# Patient Record
Sex: Female | Born: 1940
Health system: Southern US, Community
[De-identification: ages and names within clinical notes are randomized; demographics above are authoritative.]

## PROBLEM LIST (undated history)

## (undated) DIAGNOSIS — E785 Hyperlipidemia, unspecified: Secondary | ICD-10-CM

## (undated) DIAGNOSIS — Z923 Personal history of irradiation: Secondary | ICD-10-CM

## (undated) DIAGNOSIS — G5600 Carpal tunnel syndrome, unspecified upper limb: Secondary | ICD-10-CM

## (undated) DIAGNOSIS — K219 Gastro-esophageal reflux disease without esophagitis: Secondary | ICD-10-CM

## (undated) DIAGNOSIS — M1712 Unilateral primary osteoarthritis, left knee: Secondary | ICD-10-CM

## (undated) DIAGNOSIS — M1711 Unilateral primary osteoarthritis, right knee: Secondary | ICD-10-CM

## (undated) DIAGNOSIS — I82409 Acute embolism and thrombosis of unspecified deep veins of unspecified lower extremity: Secondary | ICD-10-CM

## (undated) DIAGNOSIS — M1990 Unspecified osteoarthritis, unspecified site: Secondary | ICD-10-CM

## (undated) DIAGNOSIS — C50211 Malignant neoplasm of upper-inner quadrant of right female breast: Secondary | ICD-10-CM

## (undated) HISTORY — DX: Acute embolism and thrombosis of unspecified deep veins of unspecified lower extremity: I82.409

## (undated) HISTORY — DX: Gastro-esophageal reflux disease without esophagitis: K21.9

## (undated) HISTORY — DX: Carpal tunnel syndrome, unspecified upper limb: G56.00

## (undated) HISTORY — PX: SKIN CANCER EXCISION: SHX779

## (undated) HISTORY — PX: TUBAL LIGATION: SHX77

## (undated) HISTORY — DX: Hyperlipidemia, unspecified: E78.5

## (undated) HISTORY — DX: Malignant neoplasm of upper-inner quadrant of right female breast: C50.211

## (undated) HISTORY — PX: DILATION AND CURETTAGE OF UTERUS: SHX78

## (undated) HISTORY — DX: Unspecified osteoarthritis, unspecified site: M19.90

---
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
## 1999-10-23 ENCOUNTER — Encounter: Payer: Self-pay | Admitting: Emergency Medicine

## 1999-10-23 ENCOUNTER — Encounter: Admission: RE | Admit: 1999-10-23 | Discharge: 1999-10-23 | Payer: Self-pay | Admitting: Emergency Medicine

## 2007-11-16 ENCOUNTER — Encounter: Admission: RE | Admit: 2007-11-16 | Discharge: 2007-11-16 | Payer: Self-pay | Admitting: Chiropractic Medicine

## 2008-07-27 DIAGNOSIS — M1990 Unspecified osteoarthritis, unspecified site: Secondary | ICD-10-CM

## 2008-07-27 HISTORY — DX: Unspecified osteoarthritis, unspecified site: M19.90

## 2009-07-27 HISTORY — PX: CHOLECYSTECTOMY: SHX55

## 2010-02-17 ENCOUNTER — Ambulatory Visit (HOSPITAL_COMMUNITY): Admission: RE | Admit: 2010-02-17 | Discharge: 2010-02-17 | Payer: Self-pay | Admitting: General Surgery

## 2010-10-11 LAB — SURGICAL PCR SCREEN
MRSA, PCR: NEGATIVE
Staphylococcus aureus: NEGATIVE

## 2011-07-17 ENCOUNTER — Ambulatory Visit (INDEPENDENT_AMBULATORY_CARE_PROVIDER_SITE_OTHER): Payer: Medicare Other | Admitting: Internal Medicine

## 2011-07-17 ENCOUNTER — Encounter: Payer: Self-pay | Admitting: Internal Medicine

## 2011-07-17 ENCOUNTER — Other Ambulatory Visit: Payer: Self-pay | Admitting: Internal Medicine

## 2011-07-17 ENCOUNTER — Other Ambulatory Visit (INDEPENDENT_AMBULATORY_CARE_PROVIDER_SITE_OTHER): Payer: Medicare Other

## 2011-07-17 DIAGNOSIS — K219 Gastro-esophageal reflux disease without esophagitis: Secondary | ICD-10-CM

## 2011-07-17 DIAGNOSIS — Z0000 Encounter for general adult medical examination without abnormal findings: Secondary | ICD-10-CM | POA: Insufficient documentation

## 2011-07-17 DIAGNOSIS — Z1231 Encounter for screening mammogram for malignant neoplasm of breast: Secondary | ICD-10-CM

## 2011-07-17 DIAGNOSIS — E7800 Pure hypercholesterolemia, unspecified: Secondary | ICD-10-CM

## 2011-07-17 DIAGNOSIS — E785 Hyperlipidemia, unspecified: Secondary | ICD-10-CM | POA: Insufficient documentation

## 2011-07-17 LAB — LDL CHOLESTEROL, DIRECT: Direct LDL: 158.4 mg/dL

## 2011-07-17 LAB — LIPID PANEL
Cholesterol: 248 mg/dL — ABNORMAL HIGH (ref 0–200)
HDL: 68.7 mg/dL (ref >39.00)
VLDL: 14.2 mg/dL (ref 0.0–40.0)

## 2011-07-17 LAB — CBC WITH DIFFERENTIAL/PLATELET
Basophils Relative: 0.8 % (ref 0.0–3.0)
Eosinophils Absolute: 0.2 10*3/uL (ref 0.0–0.7)
Eosinophils Relative: 2.1 % (ref 0.0–5.0)
Lymphocytes Relative: 31.1 % (ref 12.0–46.0)
Neutrophils Relative %: 56.3 % (ref 43.0–77.0)
Platelets: 275 10*3/uL (ref 150.0–400.0)
RBC: 4.34 Mil/uL (ref 3.87–5.11)
WBC: 7.1 10*3/uL (ref 4.5–10.5)

## 2011-07-17 LAB — COMPREHENSIVE METABOLIC PANEL
AST: 41 U/L — ABNORMAL HIGH (ref 0–37)
Albumin: 4.1 g/dL (ref 3.5–5.2)
BUN: 22 mg/dL (ref 6–23)
Calcium: 9.3 mg/dL (ref 8.4–10.5)
Chloride: 107 mEq/L (ref 96–112)
Potassium: 4.8 mEq/L (ref 3.5–5.1)
Sodium: 142 mEq/L (ref 135–145)
Total Protein: 7.4 g/dL (ref 6.0–8.3)

## 2011-07-17 MED ORDER — PANTOPRAZOLE SODIUM 40 MG PO TBEC: 40.0000 mg | DELAYED_RELEASE_TABLET | Freq: Every day | ORAL | Status: DC

## 2011-07-17 NOTE — Patient Instructions (Signed)
Gastroesophageal Reflux Disease, Adult Gastroesophageal reflux disease (GERD) happens when acid from your stomach flows up into the esophagus. When acid comes in contact with the esophagus, the acid causes soreness (inflammation) in the esophagus. Over time, GERD may create small holes (ulcers) in the lining of the esophagus. CAUSES   Increased body weight. This puts pressure on the stomach, making acid rise from the stomach into the esophagus.   Smoking. This increases acid production in the stomach.   Drinking alcohol. This causes decreased pressure in the lower esophageal sphincter (valve or ring of muscle between the esophagus and stomach), allowing acid from the stomach into the esophagus.   Late evening meals and a full stomach. This increases pressure and acid production in the stomach.   A malformed lower esophageal sphincter.  Sometimes, no cause is found. SYMPTOMS   Burning pain in the lower part of the mid-chest behind the breastbone and in the mid-stomach area. This may occur twice a week or more often.   Trouble swallowing.   Sore throat.   Dry cough.   Asthma-like symptoms including chest tightness, shortness of breath, or wheezing.  DIAGNOSIS  Your caregiver may be able to diagnose GERD based on your symptoms. In some cases, X-rays and other tests may be done to check for complications or to check the condition of your stomach and esophagus. TREATMENT  Your caregiver may recommend over-the-counter or prescription medicines to help decrease acid production. Ask your caregiver before starting or adding any new medicines.  HOME CARE INSTRUCTIONS   Change the factors that you can control. Ask your caregiver for guidance concerning weight loss, quitting smoking, and alcohol consumption.   Avoid foods and drinks that make your symptoms worse, such as:   Caffeine or alcoholic drinks.   Chocolate.   Peppermint or mint flavorings.   Garlic and onions.   Spicy foods.     Citrus fruits, such as oranges, lemons, or limes.   Tomato-based foods such as sauce, chili, salsa, and pizza.   Fried and fatty foods.   Avoid lying down for the 3 hours prior to your bedtime or prior to taking a nap.   Eat small, frequent meals instead of large meals.   Wear loose-fitting clothing. Do not wear anything tight around your waist that causes pressure on your stomach.   Raise the head of your bed 6 to 8 inches with wood blocks to help you sleep. Extra pillows will not help.   Only take over-the-counter or prescription medicines for pain, discomfort, or fever as directed by your caregiver.   Do not take aspirin, ibuprofen, or other nonsteroidal anti-inflammatory drugs (NSAIDs).  SEEK IMMEDIATE MEDICAL CARE IF:   You have pain in your arms, neck, jaw, teeth, or back.   Your pain increases or changes in intensity or duration.   You develop nausea, vomiting, or sweating (diaphoresis).   You develop shortness of breath, or you faint.   Your vomit is green, yellow, black, or looks like coffee grounds or blood.   Your stool is red, bloody, or black.  These symptoms could be signs of other problems, such as heart disease, gastric bleeding, or esophageal bleeding. MAKE SURE YOU:   Understand these instructions.   Will watch your condition.   Will get help right away if you are not doing well or get worse.  Document Released: 04/22/2005 Document Revised: 03/25/2011 Document Reviewed: 01/30/2011 [facility] Patient Information 2012 [place].

## 2011-07-17 NOTE — Assessment & Plan Note (Signed)
Start protonix

## 2011-07-17 NOTE — Progress Notes (Signed)
  Subjective:    Patient ID: Stacey Navarro, female    DOB: [birth year], 70 y.o.   MRN: [id number]  Heartburn She complains of heartburn and water brash. She reports no abdominal pain, no belching, no chest pain, no choking, no coughing, no dysphagia, no early satiety, no globus sensation, no hoarse voice, no nausea, no sore throat, no stridor, no tooth decay or no wheezing. This is a chronic problem. The current episode started more than 1 year ago. The problem occurs occasionally. The problem has been unchanged. The heartburn duration is an hour. The heartburn is located in the substernum. The heartburn is of mild intensity. The heartburn wakes her from sleep. The heartburn does not limit her activity. The heartburn doesn't change with position. The symptoms are aggravated by nothing. Pertinent negatives include no anemia, fatigue, melena, muscle weakness, orthopnea or weight loss. Risk factors include no known risk factors. She has tried a PPI Production assistant, radio) for the symptoms. The treatment provided mild relief.      Review of Systems  Constitutional: Negative for fever, chills, weight loss, diaphoresis, activity change, appetite change, fatigue and unexpected weight change.  HENT: Negative.  Negative for sore throat and hoarse voice.   Eyes: Negative.   Respiratory: Negative.  Negative for cough, choking and wheezing.   Cardiovascular: Negative for chest pain, palpitations and leg swelling.  Gastrointestinal: Positive for heartburn. Negative for dysphagia, nausea, vomiting, abdominal pain, diarrhea, constipation, blood in stool, melena, abdominal distention, anal bleeding and rectal pain.  Genitourinary: Negative.   Musculoskeletal: Negative.  Negative for muscle weakness.  Skin: Negative.   Neurological: Negative.   Hematological: Negative for adenopathy. Does not bruise/bleed easily.  Psychiatric/Behavioral: Negative.        Objective:   Physical Exam  Vitals  reviewed. Constitutional: She is oriented to person, place, and time. She appears well-developed and well-nourished. No distress.  HENT:  Head: Normocephalic and atraumatic.  Mouth/Throat: Oropharynx is clear and moist. No oropharyngeal exudate.  Eyes: Conjunctivae are normal. Right eye exhibits no discharge. Left eye exhibits no discharge. No scleral icterus.  Neck: Normal range of motion. Neck supple. No JVD present. No tracheal deviation present. No thyromegaly present.  Cardiovascular: Normal rate, regular rhythm, normal heart sounds and intact distal pulses.  Exam reveals no gallop and no friction rub.   No murmur heard. Pulmonary/Chest: Effort normal and breath sounds normal. No stridor. No respiratory distress. She has no wheezes. She has no rales. She exhibits no tenderness.  Abdominal: Soft. Bowel sounds are normal. She exhibits no distension. There is no tenderness. There is no rebound and no guarding.  Musculoskeletal: Normal range of motion. She exhibits no edema and no tenderness.  Lymphadenopathy:    She has no cervical adenopathy.  Neurological: She is oriented to person, place, and time.  Skin: Skin is warm and dry. No rash noted. She is not diaphoretic. No erythema. No pallor.  Psychiatric: She has a normal mood and affect. Her behavior is normal. Judgment and thought content normal.          Assessment & Plan:

## 2011-07-17 NOTE — Assessment & Plan Note (Signed)
I will check her labs today to see what her lipid levels are

## 2011-09-14 ENCOUNTER — Encounter: Payer: Self-pay | Admitting: Gastroenterology

## 2011-10-12 ENCOUNTER — Encounter: Payer: Medicare Other | Admitting: Gastroenterology

## 2011-11-05 ENCOUNTER — Encounter: Payer: Self-pay | Admitting: Internal Medicine

## 2011-11-05 ENCOUNTER — Ambulatory Visit (INDEPENDENT_AMBULATORY_CARE_PROVIDER_SITE_OTHER): Payer: Medicare Other | Admitting: Internal Medicine

## 2011-11-05 VITALS — BP 132/80 | HR 80 | Temp 98.8°F | Resp 16 | Wt 204.0 lb

## 2011-11-05 DIAGNOSIS — J209 Acute bronchitis, unspecified: Secondary | ICD-10-CM

## 2011-11-05 MED ORDER — HYDROCODONE-HOMATROPINE 5-1.5 MG/5ML PO SYRP: 5.0000 mL | ORAL_SOLUTION | Freq: Three times a day (TID) | ORAL | Status: AC | PRN

## 2011-11-05 MED ORDER — CEFUROXIME AXETIL 500 MG PO TABS: 500.0000 mg | ORAL_TABLET | Freq: Two times a day (BID) | ORAL | Status: AC

## 2011-11-05 NOTE — Patient Instructions (Signed)
Acute Bronchitis You have acute bronchitis. This means you have a chest cold. The airways in your lungs are red and sore (inflamed). Acute means it is sudden onset.  CAUSES Bronchitis is most often caused by the same virus that causes a cold. SYMPTOMS   Body aches.   Chest congestion.   Chills.   Cough.   Fever.   Shortness of breath.   Sore throat.  TREATMENT  Acute bronchitis is usually treated with rest, fluids, and medicines for relief of fever or cough. Most symptoms should go away after a few days or a week. Increased fluids may help thin your secretions and will prevent dehydration. Your caregiver may give you an inhaler to improve your symptoms. The inhaler reduces shortness of breath and helps control cough. You can take over-the-counter pain relievers or cough medicine to decrease coughing, pain, or fever. A cool-air vaporizer may help thin bronchial secretions and make it easier to clear your chest. Antibiotics are usually not needed but can be prescribed if you smoke, are seriously ill, have chronic lung problems, are elderly, or you are at higher risk for developing complications.Allergies and asthma can make bronchitis worse. Repeated episodes of bronchitis may cause longstanding lung problems. Avoid smoking and secondhand smoke.Exposure to cigarette smoke or irritating chemicals will make bronchitis worse. If you are a cigarette smoker, consider using nicotine gum or skin patches to help control withdrawal symptoms. Quitting smoking will help your lungs heal faster. Recovery from bronchitis is often slow, but you should start feeling better after 2 to 3 days. Cough from bronchitis frequently lasts for 3 to 4 weeks. To prevent another bout of acute bronchitis:  Quit smoking.   Wash your hands frequently to get rid of viruses or use a hand sanitizer.   Avoid other people with cold or virus symptoms.   Try not to touch your hands to your mouth, nose, or eyes.  SEEK  IMMEDIATE MEDICAL CARE IF:  You develop increased fever, chills, or chest pain.   You have severe shortness of breath or bloody sputum.   You develop dehydration, fainting, repeated vomiting, or a severe headache.   You have no improvement after 1 week of treatment or you get worse.  MAKE SURE YOU:   Understand these instructions.   Will watch your condition.   Will get help right away if you are not doing well or get worse.  Document Released: 08/20/2004 Document Revised: 07/02/2011 Document Reviewed: 11/05/2010 [facility] Patient Information 2012 [place].

## 2011-11-05 NOTE — Assessment & Plan Note (Signed)
Start ceftin for the infection and a cough suppressant

## 2011-11-05 NOTE — Progress Notes (Signed)
  Subjective:    Patient ID: Stacey Navarro, female    DOB: 26-Aug-1940, 71 y.o.   MRN: [id number]  Cough This is a new problem. The current episode started in the past 7 days. The problem has been gradually worsening. The problem occurs every few hours. The cough is productive of purulent sputum. Associated symptoms include chills and a sore throat. Pertinent negatives include no chest pain, ear congestion, ear pain, fever, headaches, heartburn, hemoptysis, myalgias, nasal congestion, postnasal drip, rash, rhinorrhea, shortness of breath, sweats, weight loss or wheezing. The symptoms are aggravated by nothing. She has tried OTC cough suppressant for the symptoms. The treatment provided mild relief.      Review of Systems  Constitutional: Positive for chills. Negative for fever, weight loss, diaphoresis, activity change, appetite change, fatigue and unexpected weight change.  HENT: Positive for sore throat. Negative for ear pain, rhinorrhea and postnasal drip.   Eyes: Negative.   Respiratory: Positive for cough. Negative for apnea, hemoptysis, choking, chest tightness, shortness of breath, wheezing and stridor.   Cardiovascular: Negative for chest pain, palpitations and leg swelling.  Gastrointestinal: Negative.  Negative for heartburn.  Genitourinary: Negative.   Musculoskeletal: Negative.  Negative for myalgias.  Skin: Negative for rash.  Neurological: Negative.  Negative for headaches.  Hematological: Negative for adenopathy. Does not bruise/bleed easily.  Psychiatric/Behavioral: Negative.        Objective:   Physical Exam  Vitals reviewed. Constitutional: She is oriented to person, place, and time. She appears well-developed and well-nourished. No distress.  HENT:  Head: Normocephalic and atraumatic. No trismus in the jaw.  Right Ear: Hearing, tympanic membrane, external ear and ear canal normal.  Left Ear: Hearing, external ear and ear canal normal.  Nose: Nose normal. No  mucosal edema or rhinorrhea. Right sinus exhibits no maxillary sinus tenderness and no frontal sinus tenderness. Left sinus exhibits no maxillary sinus tenderness and no frontal sinus tenderness.  Mouth/Throat: Oropharynx is clear and moist and mucous membranes are normal. Mucous membranes are not pale, not dry and not cyanotic. No uvula swelling. No oropharyngeal exudate, posterior oropharyngeal edema, posterior oropharyngeal erythema or tonsillar abscesses.  Eyes: Conjunctivae are normal. Right eye exhibits no discharge. Left eye exhibits no discharge. No scleral icterus.  Neck: Normal range of motion. Neck supple. No JVD present. No tracheal deviation present. No thyromegaly present.  Cardiovascular: Normal rate, regular rhythm, normal heart sounds and intact distal pulses.  Exam reveals no gallop and no friction rub.   No murmur heard. Pulmonary/Chest: Effort normal and breath sounds normal. No stridor. No respiratory distress. She has no wheezes. She has no rales. She exhibits no tenderness.  Abdominal: Soft. Bowel sounds are normal. She exhibits no distension. There is no tenderness. There is no rebound and no guarding.  Musculoskeletal: Normal range of motion. She exhibits no edema and no tenderness.  Lymphadenopathy:    She has no cervical adenopathy.  Neurological: She is oriented to person, place, and time.  Skin: Skin is warm and dry. No rash noted. She is not diaphoretic. No erythema. No pallor.  Psychiatric: She has a normal mood and affect. Her behavior is normal. Judgment and thought content normal.          Assessment & Plan:

## 2012-06-06 ENCOUNTER — Ambulatory Visit (INDEPENDENT_AMBULATORY_CARE_PROVIDER_SITE_OTHER): Payer: Medicare Other | Admitting: Internal Medicine

## 2012-06-06 ENCOUNTER — Encounter: Payer: Self-pay | Admitting: Internal Medicine

## 2012-06-06 VITALS — BP 102/70 | HR 62 | Temp 97.3°F | Ht 66.0 in | Wt 202.0 lb

## 2012-06-06 DIAGNOSIS — L0390 Cellulitis, unspecified: Secondary | ICD-10-CM

## 2012-06-06 DIAGNOSIS — L0291 Cutaneous abscess, unspecified: Secondary | ICD-10-CM

## 2012-06-06 MED ORDER — SULFAMETHOXAZOLE-TRIMETHOPRIM 800-160 MG PO TABS: 1.0000 | ORAL_TABLET | Freq: Two times a day (BID) | ORAL | Status: DC

## 2012-06-06 NOTE — Progress Notes (Signed)
  Subjective:    Patient ID: Stacey Navarro, female    DOB: 10/24/1940, 71 y.o.   MRN: [id number]  HPI  Pt presents to the clinic with a 2 week history of a lump on the back of her neck. She is concerned that it may be a cyst. 2 weeks ago she stated that it was a small spot on the back of her neck that was very itchy. The more she scracthed it, the bigger it kept getting. It is not painful. It does not leak pus. She has never had anything like this before. She has taken some advil which she feels has made some of the swelling go down.  Review of Systems      Past Medical History  Diagnosis Date  . GERD (gastroesophageal reflux disease)   . Hyperlipidemia   . Arthritis 2010    OA in knees    Current Outpatient Prescriptions  Medication Sig Dispense Refill  . pantoprazole (PROTONIX) 40 MG tablet Take 1 tablet (40 mg total) by mouth daily.  30 tablet  11  . sulfamethoxazole-trimethoprim (BACTRIM DS,SEPTRA DS) 800-160 MG per tablet Take 1 tablet by mouth 2 (two) times daily.  20 tablet  0    No Known Allergies  Family History  Problem Relation Age of Onset  . Hypertension Mother   . Alcohol abuse Father     History   Social History  . Marital Status: Widowed    Spouse Name: [name]    Number of Children: [name]  . Years of Education: [name]   Occupational History  . Not on file.   Social History Main Topics  . Smoking status: Former Research scientist (life sciences)  . Smokeless tobacco: Never Used  . Alcohol Use: 1.2 oz/week    2 Glasses of wine per week  . Drug Use: No  . Sexually Active: Not Currently    Birth Control/ Protection: Post-menopausal   Other Topics Concern  . Not on file   Social History Narrative  . No narrative on file     Constitutional: Denies fever, malaise, fatigue, headache or abrupt weight changes.  Skin:  Pt reports red lump on the back of her neck. Denies rashes, lesions or ulcercations.     No other specific complaints in a complete review of systems (except as  listed in HPI above).  Objective:   Physical Exam  BP 102/70  Pulse 62  Temp 97.3 F (36.3 C) (Oral)  Ht 5' 6"  (1.676 m)  Wt 202 lb (91.627 kg)  BMI 32.60 kg/m2  SpO2 97% Wt Readings from Last 3 Encounters:  06/06/12 202 lb (91.627 kg)  11/05/11 204 lb (92.534 kg)  07/17/11 219 lb (99.338 kg)    General: Appears their stated age, well developed, well nourished in NAD. Skin: Quarter size abscess at the nape of the neck, coming to a head but no evidence of pus or drainage.  Cardiovascular: Normal rate and rhythm. S1,S2 noted.  No murmur, rubs or gallops noted. No JVD or BLE edema. No carotid bruits noted. Pulmonary/Chest: Normal effort and positive vesicular breath sounds. No respiratory distress. No wheezes, rales or ronchi noted.       Assessment & Plan:   Abscess, new problem with additional workup required  Bactrim x 10 days Avoid scracthing the area. Place warm compresses on the area daily to aid in drainage of the area. If antibiotics not helpful, may need incision and drainage.  RTC if symptoms persist or get worse.

## 2012-06-06 NOTE — Patient Instructions (Addendum)
Abscess An abscess is an infected area that contains a collection of pus and debris. It can occur in almost any part of the body. An abscess is also known as a furuncle or boil. CAUSES   An abscess occurs when tissue gets infected. This can occur from blockage of oil or sweat glands, infection of hair follicles, or a minor injury to the skin. As the body tries to fight the infection, pus collects in the area and creates pressure under the skin. This pressure causes pain. People with weakened immune systems have difficulty fighting infections and get certain abscesses more often.   SYMPTOMS Usually an abscess develops on the skin and becomes a painful mass that is red, warm, and tender. If the abscess forms under the skin, you may feel a moveable soft area under the skin. Some abscesses break open (rupture) on their own, but most will continue to get worse without care. The infection can spread deeper into the body and eventually into the bloodstream, causing you to feel ill.   DIAGNOSIS   Your caregiver will take your medical history and perform a physical exam. A sample of fluid may also be taken from the abscess to determine what is causing your infection. TREATMENT   Your caregiver may prescribe antibiotic medicines to fight the infection. However, taking antibiotics alone usually does not cure an abscess. Your caregiver may need to make a small cut (incision) in the abscess to drain the pus. In some cases, gauze is packed into the abscess to reduce pain and to continue draining the area. HOME CARE INSTRUCTIONS    Only take over-the-counter or prescription medicines for pain, discomfort, or fever as directed by your caregiver.   If you were prescribed antibiotics, take them as directed. Finish them even if you start to feel better.   If gauze is used, follow your caregiver's directions for changing the gauze.   To avoid spreading the infection:   Keep your draining abscess covered with a  bandage.   Wash your hands well.   Do not share personal care items, towels, or whirlpools with others.   Avoid skin contact with others.   Keep your skin and clothes clean around the abscess.   Keep all follow-up appointments as directed by your caregiver.  SEEK MEDICAL CARE IF:    You have increased pain, swelling, redness, fluid drainage, or bleeding.   You have muscle aches, chills, or a general ill feeling.   You have a fever.  MAKE SURE YOU:    Understand these instructions.   Will watch your condition.   Will get help right away if you are not doing well or get worse.  Document Released: 04/22/2005 Document Revised: 01/12/2012 Document Reviewed: 09/25/2011 [facility] Patient Information 2013 [place].

## 2012-07-25 ENCOUNTER — Other Ambulatory Visit: Payer: Self-pay | Admitting: Internal Medicine

## 2012-12-14 ENCOUNTER — Ambulatory Visit (INDEPENDENT_AMBULATORY_CARE_PROVIDER_SITE_OTHER): Payer: Medicare Other | Admitting: Sports Medicine

## 2012-12-14 ENCOUNTER — Encounter: Payer: Self-pay | Admitting: Sports Medicine

## 2012-12-14 VITALS — BP 131/84 | HR 67 | Ht 66.0 in | Wt 193.0 lb

## 2012-12-14 DIAGNOSIS — M1711 Unilateral primary osteoarthritis, right knee: Secondary | ICD-10-CM | POA: Insufficient documentation

## 2012-12-14 DIAGNOSIS — IMO0002 Reserved for concepts with insufficient information to code with codable children: Secondary | ICD-10-CM

## 2012-12-14 DIAGNOSIS — M1710 Unilateral primary osteoarthritis, unspecified knee: Secondary | ICD-10-CM

## 2012-12-14 MED ORDER — METHYLPREDNISOLONE ACETATE 40 MG/ML IJ SUSP
40.0000 mg | Freq: Once | INTRAMUSCULAR | Status: AC
  Administered 2012-12-14: 40 mg via INTRA_ARTICULAR

## 2012-12-14 NOTE — Patient Instructions (Signed)
Very nice to meet you. We did give you an injection into your right knee today. This may be sore for a couple days. Ice may be beneficial. I am also giving you exercises I would like you to do 3 times a week. Lets make a followup appointment in 4 weeks.

## 2012-12-14 NOTE — Assessment & Plan Note (Addendum)
Patient given her steroid injection into the right knee today. Patient given home exercise program that she will do 3 times a week. Encourage icing 20 minutes 3 times a day as well.  Knee compression sleeve given as well.  We will get this patient's notes from [facility] and located patient's x-rays at that time. Patient will return again in 4 weeks. If she continues to have pain at that time we may want to consider repeat x-rays as well as viscus supplementation.

## 2012-12-14 NOTE — Addendum Note (Signed)
Addended by: Arnette Schaumann C on: 12/14/2012 11:35 AM   Modules accepted: Orders

## 2012-12-14 NOTE — Progress Notes (Signed)
SUBJECTIVE: Stacey Navarro is a 72 y.o. female who has right knee pain for years. No true mechanism of injury. Patient states that the pain is mostly on the medial aspect of the knee and describes it is more of a sharp pain but does have a dull aching sensation sometimes. Patient has not taken any medications for it but has been continue to do exercises at her gym. Patient states that she's not doing the exercises as much and that seems to have exacerbated the knee pain. Patient notices that the pain is worse with walking a significant amount of time especially with stairs. Patient denies any clicking popping or any other mechanical symptoms at this time. Denies any radiation of pain or any numbness in the feet extremities. Patient also denies any nighttime awakening.    OBJECTIVE: Blood pressure 131/84, pulse 67, height 5' 6"  (1.676 m), weight 193 lb (87.544 kg).  Appearance: alert, well appearing, and in no distress, oriented to person, place, and time and normal appearing weight. Knee exam: soft tissue tenderness over medial joint line, negative drawer sign, positive drawer sign, negative pivot-shift, collateral ligaments intact, negative McMurray sign, normal ipsilateral hip exam. X-ray: not available.  Procedure note After verbal and written consent given pt was prepped with betadine.  1:3 kenalog 40 to lidocaine used in right knee with 22g 1 1/2 inch needle.  Pt minimal bleeding dressed with band aid, Pt given red flags to look for pt had better pain control immediatly.     ASSESSMENT: Knee underlying chronic DJD likely  PLAN: rest the injured area as much as practical, apply ice packs, elevate the injured limb, compressive bandage, injection today as above.  See orders for this visit as documented in the electronic medical record.

## 2013-08-03 ENCOUNTER — Other Ambulatory Visit: Payer: Self-pay | Admitting: Internal Medicine

## 2013-08-21 ENCOUNTER — Other Ambulatory Visit: Payer: Self-pay | Admitting: Internal Medicine

## 2013-08-24 ENCOUNTER — Other Ambulatory Visit: Payer: Self-pay | Admitting: Internal Medicine

## 2013-08-25 ENCOUNTER — Other Ambulatory Visit: Payer: Self-pay | Admitting: Internal Medicine

## 2013-08-25 ENCOUNTER — Telehealth: Payer: Self-pay

## 2013-08-25 MED ORDER — PANTOPRAZOLE SODIUM 40 MG PO TBEC: DELAYED_RELEASE_TABLET | ORAL | Status: DC

## 2013-08-25 NOTE — Telephone Encounter (Signed)
The patient called and is hoping to get a refill on her protonix rx

## 2014-05-21 ENCOUNTER — Encounter: Payer: Self-pay | Admitting: Sports Medicine

## 2014-05-21 ENCOUNTER — Ambulatory Visit (INDEPENDENT_AMBULATORY_CARE_PROVIDER_SITE_OTHER): Payer: Medicare Other | Admitting: Sports Medicine

## 2014-05-21 ENCOUNTER — Ambulatory Visit
Admission: RE | Admit: 2014-05-21 | Discharge: 2014-05-21 | Disposition: A | Discharge status: Home / Self Care | Payer: Medicare Other | Source: Ambulatory Visit | Attending: Family Medicine | Admitting: Family Medicine

## 2014-05-21 VITALS — BP 144/86 | HR 73 | Ht 66.0 in | Wt 195.0 lb

## 2014-05-21 DIAGNOSIS — M79645 Pain in left finger(s): Secondary | ICD-10-CM

## 2014-05-21 DIAGNOSIS — M79644 Pain in right finger(s): Secondary | ICD-10-CM

## 2014-05-21 NOTE — Assessment & Plan Note (Addendum)
Most likely a trigger thumb with the report of it "locking" in place and palpation of nodule on exam. No symptoms to suggest carpal tunnel. Could be associated with arthritis. No injury to suggest a fracture. Exam not suggestive of de Quervain's tenosynovitis.  - will try voltaren gel applied to area of most tender TID  - f/u in 2 weeks  - if not improved, then consider injection

## 2014-05-21 NOTE — Progress Notes (Signed)
  Stacey Navarro - 73 y.o. female MRN [id number]  Date of birth: [birth year]    SUBJECTIVE:     Stacey Navarro is a 73 year old F presenting with b/l thumb pain. The pain has been occuring for three weeks. Both her thumbs will lock in the extended position at times. This will last for 3-4 minutes. She is now retired and use to work as an account. She hasn't been doing any new exercises or working more around the house. She denies any thyroid or having diabetes. She denies any numbness, tingling or weakness. Nothing has seemed to improve her symptoms. It has been harder for her to push down on the remote with her right thumb. She has been using some topical Voltaren gel but only intermittently.  ROS:     See HPI   OBJECTIVE: BP 144/86  Pulse 73  Physical Exam:  Vital signs are reviewed. General: Well appearing, NAD, alert.  Thumb/hand Exam:  Laterality: right Appearance: no swelling, erythema, ecchymosis  Edema: no   Tenderness: yes  Ulnar aspect of base of MCP Palpation: small nodule on A1 pulley Range of Motion:  Wrist Flexion: normal Wrist Extension:normal Wrist Ulnar deviation: normal Wrist Radial deviation: normal  Thumb flexion: limited   Thumb extension: normal   Thumb opposition: normal  Maneuvers:  Finkelstein's: neg Tinel's: neg Phalen's: neg  Strength:  Wrist extension: 5/5 Wrist flexion: 5/5 Ulnar Deviation: 5/5  Radial Deviation: 5/5   Thumb/hand Exam:  Laterality: left Appearance: no swelling, erythema, ecchymosis  Edema: no  Tenderness: yes Ulnar aspect of base of MCP Palpation: small nodule on A1 pulley Range of Motion:  Wrist Flexion: normal Wrist Extension:normal Wrist Ulnar deviation: normal Wrist Radial deviation: normal  Thumb flexion:limited  Thumb extension:normal  Thumb opposition: normal Maneuvers:  Finkelstein's: neg Tinel's: neg Phalen's: neg Strength:  Wrist extension: 5/5 Wrist flexion: 5/5 Ulnar Deviation: 5/5 Radial Deviation: 5/5    X-rays of each thumb including AP, lateral, and CMC views are reviewed. There are some mild degenerative changes at the [employer] bilaterally. Nothing acute.  ASSESSMENT & PLAN:  See problem based charting & AVS for pt instructions.

## 2014-06-05 ENCOUNTER — Encounter: Payer: Self-pay | Admitting: Sports Medicine

## 2014-06-05 ENCOUNTER — Ambulatory Visit (INDEPENDENT_AMBULATORY_CARE_PROVIDER_SITE_OTHER): Payer: Medicare Other | Admitting: Sports Medicine

## 2014-06-05 VITALS — BP 121/77 | HR 67 | Ht 66.0 in | Wt 195.0 lb

## 2014-06-05 DIAGNOSIS — M65311 Trigger thumb, right thumb: Secondary | ICD-10-CM

## 2014-06-05 DIAGNOSIS — M79644 Pain in right finger(s): Secondary | ICD-10-CM

## 2014-06-05 DIAGNOSIS — M65312 Trigger thumb, left thumb: Secondary | ICD-10-CM

## 2014-06-05 MED ORDER — TRIAMCINOLONE ACETONIDE 10 MG/ML IJ SUSP
2.0000 mg | Freq: Once | INTRAMUSCULAR | Status: AC
  Administered 2014-06-05: 2 mg via INTRA_ARTICULAR

## 2014-06-05 NOTE — Progress Notes (Signed)
   Subjective:    Patient ID: Stacey Navarro, female    DOB: 07/16/1941, 73 y.o.   MRN: [id number]  HPI  Patient comes in today for follow-up on bilateral trigger thumbs. She has been using topical Voltaren. Left thumb is improving but the right thumb is not. She is here today to consider cortisone injection.    Review of Systems     Objective:   Physical Exam Well-developed, well-nourished. No acute distress  Left thumb: There is still a painful nodule at the A1 pulley. However, range of motion is good. No active triggering  Right thumb: Patient is still unable to completely flex the IP and MCP joints. Flexor tendon is intact. Palpable nodule at the A1 pulley. No soft tissue swelling.       Assessment & Plan:  Bilateral trigger thumbs  Since the left thumb is improving I will hold on any further treatment here. For the right thumb I recommended a cortisone injection. This was done under ultrasound guidance. Patient will follow-up with me in 2-3 weeks. If symptoms resolve with today's cortisone injection then we may consider injecting her left thumb follow-up if symptoms have not completely resolved by then. If her symptoms persist despite today's injection I would consider referral to one of the hand specialist to consider A1 pulley release.  Consent obtained and verified. Time-out conducted. Noted no overlying erythema, induration, or other signs of local infection. Skin prepped in a sterile fashion. Topical analgesic spray: Ethyl chloride. Joint: right trigger thumb Needle: 25g 5/8 inch Completed without difficulty. Meds: 0.2 cc triamcinolone and 0.2cc 1% xylocaine. Injected into flexor tendon sheath just distal to the A1 pulley  Advised to call if fevers/chills, erythema, induration, drainage, or persistent bleeding.

## 2014-06-19 ENCOUNTER — Ambulatory Visit: Payer: Medicare Other | Admitting: Sports Medicine

## 2014-07-03 ENCOUNTER — Ambulatory Visit: Payer: Medicare Other | Admitting: Sports Medicine

## 2014-07-27 DIAGNOSIS — C50919 Malignant neoplasm of unspecified site of unspecified female breast: Secondary | ICD-10-CM

## 2014-07-27 HISTORY — DX: Malignant neoplasm of unspecified site of unspecified female breast: C50.919

## 2014-09-18 ENCOUNTER — Other Ambulatory Visit: Payer: Self-pay | Admitting: Internal Medicine

## 2014-10-03 ENCOUNTER — Encounter: Payer: Self-pay | Admitting: Internal Medicine

## 2014-10-03 ENCOUNTER — Ambulatory Visit (INDEPENDENT_AMBULATORY_CARE_PROVIDER_SITE_OTHER): Payer: Medicare Other | Admitting: Internal Medicine

## 2014-10-03 ENCOUNTER — Other Ambulatory Visit (INDEPENDENT_AMBULATORY_CARE_PROVIDER_SITE_OTHER): Payer: Medicare Other

## 2014-10-03 ENCOUNTER — Other Ambulatory Visit (HOSPITAL_COMMUNITY)
Admission: RE | Admit: 2014-10-03 | Discharge: 2014-10-03 | Disposition: A | Discharge status: Home / Self Care | Payer: Medicare Other | Source: Ambulatory Visit | Attending: Internal Medicine | Admitting: Internal Medicine

## 2014-10-03 VITALS — BP 128/82 | HR 98 | Temp 98.5°F | Resp 16 | Wt 219.0 lb

## 2014-10-03 DIAGNOSIS — R7989 Other specified abnormal findings of blood chemistry: Secondary | ICD-10-CM

## 2014-10-03 DIAGNOSIS — Z23 Encounter for immunization: Secondary | ICD-10-CM | POA: Diagnosis not present

## 2014-10-03 DIAGNOSIS — Z124 Encounter for screening for malignant neoplasm of cervix: Secondary | ICD-10-CM | POA: Insufficient documentation

## 2014-10-03 DIAGNOSIS — E7800 Pure hypercholesterolemia, unspecified: Secondary | ICD-10-CM

## 2014-10-03 DIAGNOSIS — K219 Gastro-esophageal reflux disease without esophagitis: Secondary | ICD-10-CM

## 2014-10-03 DIAGNOSIS — R945 Abnormal results of liver function studies: Secondary | ICD-10-CM

## 2014-10-03 DIAGNOSIS — Z0000 Encounter for general adult medical examination without abnormal findings: Secondary | ICD-10-CM

## 2014-10-03 DIAGNOSIS — Z1231 Encounter for screening mammogram for malignant neoplasm of breast: Secondary | ICD-10-CM

## 2014-10-03 LAB — COMPREHENSIVE METABOLIC PANEL
ALBUMIN: 4.4 g/dL (ref 3.5–5.2)
ALT: 48 U/L — ABNORMAL HIGH (ref 0–35)
AST: 33 U/L (ref 0–37)
Alkaline Phosphatase: 108 U/L (ref 39–117)
BUN: 17 mg/dL (ref 6–23)
CALCIUM: 9.9 mg/dL (ref 8.4–10.5)
CHLORIDE: 103 meq/L (ref 96–112)
CO2: 27 meq/L (ref 19–32)
Creatinine, Ser: 0.92 mg/dL (ref 0.40–1.20)
GFR: 63.78 mL/min (ref >60.00)
Glucose, Bld: 98 mg/dL (ref 70–99)
POTASSIUM: 4.1 meq/L (ref 3.5–5.1)
Sodium: 138 mEq/L (ref 135–145)
TOTAL PROTEIN: 8 g/dL (ref 6.0–8.3)
Total Bilirubin: 0.7 mg/dL (ref 0.2–1.2)

## 2014-10-03 LAB — CBC WITH DIFFERENTIAL/PLATELET
Basophils Absolute: 0.1 10*3/uL (ref 0.0–0.1)
Basophils Relative: 0.9 % (ref 0.0–3.0)
EOS ABS: 0.2 10*3/uL (ref 0.0–0.7)
Eosinophils Relative: 2 % (ref 0.0–5.0)
HCT: 44.4 % (ref 36.0–46.0)
Hemoglobin: 15.2 g/dL — ABNORMAL HIGH (ref 12.0–15.0)
Lymphocytes Relative: 20.6 % (ref 12.0–46.0)
Lymphs Abs: 2 10*3/uL (ref 0.7–4.0)
MCHC: 34.3 g/dL (ref 30.0–36.0)
MCV: 93.2 fl (ref 78.0–100.0)
MONOS PCT: 6.5 % (ref 3.0–12.0)
Monocytes Absolute: 0.6 10*3/uL (ref 0.1–1.0)
Neutro Abs: 6.9 10*3/uL (ref 1.4–7.7)
Neutrophils Relative %: 70 % (ref 43.0–77.0)
Platelets: 297 10*3/uL (ref 150.0–400.0)
RBC: 4.76 Mil/uL (ref 3.87–5.11)
RDW: 14.1 % (ref 11.5–15.5)
WBC: 9.9 10*3/uL (ref 4.0–10.5)

## 2014-10-03 LAB — LIPID PANEL
CHOL/HDL RATIO: 4
Cholesterol: 230 mg/dL — ABNORMAL HIGH (ref 0–200)
HDL: 65.5 mg/dL (ref >39.00)
LDL Cholesterol: 146 mg/dL — ABNORMAL HIGH (ref 0–99)
NonHDL: 164.5
Triglycerides: 94 mg/dL (ref 0.0–149.0)
VLDL: 18.8 mg/dL (ref 0.0–40.0)

## 2014-10-03 LAB — TSH: TSH: 3.21 u[IU]/mL (ref 0.35–4.50)

## 2014-10-03 LAB — FECAL OCCULT BLOOD, GUAIAC: FECAL OCCULT BLD: NEGATIVE

## 2014-10-03 MED ORDER — PANTOPRAZOLE SODIUM 40 MG PO TBEC: DELAYED_RELEASE_TABLET | ORAL | Status: DC

## 2014-10-03 NOTE — Patient Instructions (Signed)
Preventive Care for Adults A healthy lifestyle and preventive care can promote health and wellness. Preventive health guidelines for women include the following key practices.  A routine yearly physical is a good way to check with your health care provider about your health and preventive screening. It is a chance to share any concerns and updates on your health and to receive a thorough exam.  Visit your dentist for a routine exam and preventive care every 6 months. Brush your teeth twice a day and floss once a day. Good oral hygiene prevents tooth decay and gum disease.  The frequency of eye exams is based on your age, health, family medical history, use of contact lenses, and other factors. Follow your health care provider's recommendations for frequency of eye exams.  Eat a healthy diet. Foods like vegetables, fruits, whole grains, low-fat dairy products, and lean protein foods contain the nutrients you need without too many calories. Decrease your intake of foods high in solid fats, added sugars, and salt. Eat the right amount of calories for you.Get information about a proper diet from your health care provider, if necessary.  Regular physical exercise is one of the most important things you can do for your health. Most adults should get at least 150 minutes of moderate-intensity exercise (any activity that increases your heart rate and causes you to sweat) each week. In addition, most adults need muscle-strengthening exercises on 2 or more days a week.  Maintain a healthy weight. The body mass index (BMI) is a screening tool to identify possible weight problems. It provides an estimate of body fat based on height and weight. Your health care provider can find your BMI and can help you achieve or maintain a healthy weight.For adults 20 years and older:  A BMI below 18.5 is considered underweight.  A BMI of 18.5 to 24.9 is normal.  A BMI of 25 to 29.9 is considered overweight.  A BMI of  30 and above is considered obese.  Maintain normal blood lipids and cholesterol levels by exercising and minimizing your intake of saturated fat. Eat a balanced diet with plenty of fruit and vegetables. Blood tests for lipids and cholesterol should begin at age 76 and be repeated every 5 years. If your lipid or cholesterol levels are high, you are over 50, or you are at high risk for heart disease, you may need your cholesterol levels checked more frequently.Ongoing high lipid and cholesterol levels should be treated with medicines if diet and exercise are not working.  If you smoke, find out from your health care provider how to quit. If you do not use tobacco, do not start.  Lung cancer screening is recommended for adults aged 22-80 years who are at high risk for developing lung cancer because of a history of smoking. A yearly low-dose CT scan of the lungs is recommended for people who have at least a 30-pack-year history of smoking and are a current smoker or have quit within the past 15 years. A pack year of smoking is smoking an average of 1 pack of cigarettes a day for 1 year (for example: 1 pack a day for 30 years or 2 packs a day for 15 years). Yearly screening should continue until the smoker has stopped smoking for at least 15 years. Yearly screening should be stopped for people who develop a health problem that would prevent them from having lung cancer treatment.  If you are pregnant, do not drink alcohol. If you are breastfeeding,  be very cautious about drinking alcohol. If you are not pregnant and choose to drink alcohol, do not have more than 1 drink per day. One drink is considered to be 12 ounces (355 mL) of beer, 5 ounces (148 mL) of wine, or 1.5 ounces (44 mL) of liquor.  Avoid use of street drugs. Do not share needles with anyone. Ask for help if you need support or instructions about stopping the use of drugs.  High blood pressure causes heart disease and increases the risk of  stroke. Your blood pressure should be checked at least every 1 to 2 years. Ongoing high blood pressure should be treated with medicines if weight loss and exercise do not work.  If you are 75-52 years old, ask your health care provider if you should take aspirin to prevent strokes.  Diabetes screening involves taking a blood sample to check your fasting blood sugar level. This should be done once every 3 years, after age 15, if you are within normal weight and without risk factors for diabetes. Testing should be considered at a younger age or be carried out more frequently if you are overweight and have at least 1 risk factor for diabetes.  Breast cancer screening is essential preventive care for women. You should practice "breast self-awareness." This means understanding the normal appearance and feel of your breasts and may include breast self-examination. Any changes detected, no matter how small, should be reported to a health care provider. Women in their 58s and 30s should have a clinical breast exam (CBE) by a health care provider as part of a regular health exam every 1 to 3 years. After age 16, women should have a CBE every year. Starting at age 53, women should consider having a mammogram (breast X-ray test) every year. Women who have a family history of breast cancer should talk to their health care provider about genetic screening. Women at a high risk of breast cancer should talk to their health care providers about having an MRI and a mammogram every year.  Breast cancer gene (BRCA)-related cancer risk assessment is recommended for women who have family members with BRCA-related cancers. BRCA-related cancers include breast, ovarian, tubal, and peritoneal cancers. Having family members with these cancers may be associated with an increased risk for harmful changes (mutations) in the breast cancer genes BRCA1 and BRCA2. Results of the assessment will determine the need for genetic counseling and  BRCA1 and BRCA2 testing.  Routine pelvic exams to screen for cancer are no longer recommended for nonpregnant women who are considered low risk for cancer of the pelvic organs (ovaries, uterus, and vagina) and who do not have symptoms. Ask your health care provider if a screening pelvic exam is right for you.  If you have had past treatment for cervical cancer or a condition that could lead to cancer, you need Pap tests and screening for cancer for at least 20 years after your treatment. If Pap tests have been discontinued, your risk factors (such as having a new sexual partner) need to be reassessed to determine if screening should be resumed. Some women have medical problems that increase the chance of getting cervical cancer. In these cases, your health care provider may recommend more frequent screening and Pap tests.  The HPV test is an additional test that may be used for cervical cancer screening. The HPV test looks for the virus that can cause the cell changes on the cervix. The cells collected during the Pap test can be  tested for HPV. The HPV test could be used to screen women aged 30 years and older, and should be used in women of any age who have unclear Pap test results. After the age of 30, women should have HPV testing at the same frequency as a Pap test.  Colorectal cancer can be detected and often prevented. Most routine colorectal cancer screening begins at the age of 50 years and continues through age 75 years. However, your health care provider may recommend screening at an earlier age if you have risk factors for colon cancer. On a yearly basis, your health care provider may provide home test kits to check for hidden blood in the stool. Use of a small camera at the end of a tube, to directly examine the colon (sigmoidoscopy or colonoscopy), can detect the earliest forms of colorectal cancer. Talk to your health care provider about this at age 50, when routine screening begins. Direct  exam of the colon should be repeated every 5-10 years through age 75 years, unless early forms of pre-cancerous polyps or small growths are found.  People who are at an increased risk for hepatitis B should be screened for this virus. You are considered at high risk for hepatitis B if:  You were born in a country where hepatitis B occurs often. Talk with your health care provider about which countries are considered high risk.  Your parents were born in a high-risk country and you have not received a shot to protect against hepatitis B (hepatitis B vaccine).  You have HIV or AIDS.  You use needles to inject street drugs.  You live with, or have sex with, someone who has hepatitis B.  You get hemodialysis treatment.  You take certain medicines for conditions like cancer, organ transplantation, and autoimmune conditions.  Hepatitis C blood testing is recommended for all people born from 1945 through 1965 and any individual with known risks for hepatitis C.  Practice safe sex. Use condoms and avoid high-risk sexual practices to reduce the spread of sexually transmitted infections (STIs). STIs include gonorrhea, chlamydia, syphilis, trichomonas, herpes, HPV, and human immunodeficiency virus (HIV). Herpes, HIV, and HPV are viral illnesses that have no cure. They can result in disability, cancer, and death.  You should be screened for sexually transmitted illnesses (STIs) including gonorrhea and chlamydia if:  You are sexually active and are younger than 24 years.  You are older than 24 years and your health care provider tells you that you are at risk for this type of infection.  Your sexual activity has changed since you were last screened and you are at an increased risk for chlamydia or gonorrhea. Ask your health care provider if you are at risk.  If you are at risk of being infected with HIV, it is recommended that you take a prescription medicine daily to prevent HIV infection. This is  called preexposure prophylaxis (PrEP). You are considered at risk if:  You are a heterosexual woman, are sexually active, and are at increased risk for HIV infection.  You take drugs by injection.  You are sexually active with a partner who has HIV.  Talk with your health care provider about whether you are at high risk of being infected with HIV. If you choose to begin PrEP, you should first be tested for HIV. You should then be tested every 3 months for as long as you are taking PrEP.  Osteoporosis is a disease in which the bones lose minerals and strength   with aging. This can result in serious bone fractures or breaks. The risk of osteoporosis can be identified using a bone density scan. Women ages 65 years and over and women at risk for fractures or osteoporosis should discuss screening with their health care providers. Ask your health care provider whether you should take a calcium supplement or vitamin D to reduce the rate of osteoporosis.  Menopause can be associated with physical symptoms and risks. Hormone replacement therapy is available to decrease symptoms and risks. You should talk to your health care provider about whether hormone replacement therapy is right for you.  Use sunscreen. Apply sunscreen liberally and repeatedly throughout the day. You should seek shade when your shadow is shorter than you. Protect yourself by wearing long sleeves, pants, a wide-brimmed hat, and sunglasses year round, whenever you are outdoors.  Once a month, do a whole body skin exam, using a mirror to look at the skin on your back. Tell your health care provider of new moles, moles that have irregular borders, moles that are larger than a pencil eraser, or moles that have changed in shape or color.  Stay current with required vaccines (immunizations).  Influenza vaccine. All adults should be immunized every year.  Tetanus, diphtheria, and acellular pertussis (Td, Tdap) vaccine. Pregnant women should  receive 1 dose of Tdap vaccine during each pregnancy. The dose should be obtained regardless of the length of time since the last dose. Immunization is preferred during the 27th-36th week of gestation. An adult who has not previously received Tdap or who does not know her vaccine status should receive 1 dose of Tdap. This initial dose should be followed by tetanus and diphtheria toxoids (Td) booster doses every 10 years. Adults with an unknown or incomplete history of completing a 3-dose immunization series with Td-containing vaccines should begin or complete a primary immunization series including a Tdap dose. Adults should receive a Td booster every 10 years.  Varicella vaccine. An adult without evidence of immunity to varicella should receive 2 doses or a second dose if she has previously received 1 dose. Pregnant females who do not have evidence of immunity should receive the first dose after pregnancy. This first dose should be obtained before leaving the health care facility. The second dose should be obtained 4-8 weeks after the first dose.  Human papillomavirus (HPV) vaccine. Females aged 13-26 years who have not received the vaccine previously should obtain the 3-dose series. The vaccine is not recommended for use in pregnant females. However, pregnancy testing is not needed before receiving a dose. If a female is found to be pregnant after receiving a dose, no treatment is needed. In that case, the remaining doses should be delayed until after the pregnancy. Immunization is recommended for any person with an immunocompromised condition through the age of 26 years if she did not get any or all doses earlier. During the 3-dose series, the second dose should be obtained 4-8 weeks after the first dose. The third dose should be obtained 24 weeks after the first dose and 16 weeks after the second dose.  Zoster vaccine. One dose is recommended for adults aged 60 years or older unless certain conditions are  present.  Measles, mumps, and rubella (MMR) vaccine. Adults born before 1957 generally are considered immune to measles and mumps. Adults born in 1957 or later should have 1 or more doses of MMR vaccine unless there is a contraindication to the vaccine or there is laboratory evidence of immunity to   each of the three diseases. A routine second dose of MMR vaccine should be obtained at least 28 days after the first dose for students attending postsecondary schools, health care workers, or international travelers. People who received inactivated measles vaccine or an unknown type of measles vaccine during 1963-1967 should receive 2 doses of MMR vaccine. People who received inactivated mumps vaccine or an unknown type of mumps vaccine before 1979 and are at high risk for mumps infection should consider immunization with 2 doses of MMR vaccine. For females of childbearing age, rubella immunity should be determined. If there is no evidence of immunity, females who are not pregnant should be vaccinated. If there is no evidence of immunity, females who are pregnant should delay immunization until after pregnancy. Unvaccinated health care workers born before 1957 who lack laboratory evidence of measles, mumps, or rubella immunity or laboratory confirmation of disease should consider measles and mumps immunization with 2 doses of MMR vaccine or rubella immunization with 1 dose of MMR vaccine.  Pneumococcal 13-valent conjugate (PCV13) vaccine. When indicated, a person who is uncertain of her immunization history and has no record of immunization should receive the PCV13 vaccine. An adult aged 19 years or older who has certain medical conditions and has not been previously immunized should receive 1 dose of PCV13 vaccine. This PCV13 should be followed with a dose of pneumococcal polysaccharide (PPSV23) vaccine. The PPSV23 vaccine dose should be obtained at least 8 weeks after the dose of PCV13 vaccine. An adult aged 19  years or older who has certain medical conditions and previously received 1 or more doses of PPSV23 vaccine should receive 1 dose of PCV13. The PCV13 vaccine dose should be obtained 1 or more years after the last PPSV23 vaccine dose.  Pneumococcal polysaccharide (PPSV23) vaccine. When PCV13 is also indicated, PCV13 should be obtained first. All adults aged 65 years and older should be immunized. An adult younger than age 65 years who has certain medical conditions should be immunized. Any person who resides in a nursing home or long-term care facility should be immunized. An adult smoker should be immunized. People with an immunocompromised condition and certain other conditions should receive both PCV13 and PPSV23 vaccines. People with human immunodeficiency virus (HIV) infection should be immunized as soon as possible after diagnosis. Immunization during chemotherapy or radiation therapy should be avoided. Routine use of PPSV23 vaccine is not recommended for American Indians, [place] Natives, or people younger than 65 years unless there are medical conditions that require PPSV23 vaccine. When indicated, people who have unknown immunization and have no record of immunization should receive PPSV23 vaccine. One-time revaccination 5 years after the first dose of PPSV23 is recommended for people aged 19-64 years who have chronic kidney failure, nephrotic syndrome, asplenia, or immunocompromised conditions. People who received 1-2 doses of PPSV23 before age 65 years should receive another dose of PPSV23 vaccine at age 65 years or later if at least 5 years have passed since the previous dose. Doses of PPSV23 are not needed for people immunized with PPSV23 at or after age 65 years.  Meningococcal vaccine. Adults with asplenia or persistent complement component deficiencies should receive 2 doses of quadrivalent meningococcal conjugate (MenACWY-D) vaccine. The doses should be obtained at least 2 months apart.  Microbiologists working with certain meningococcal bacteria, military recruits, people at risk during an outbreak, and people who travel to or live in countries with a high rate of meningitis should be immunized. A first-year college student up through age   21 years who is living in a residence hall should receive a dose if she did not receive a dose on or after her 16th birthday. Adults who have certain high-risk conditions should receive one or more doses of vaccine.  Hepatitis A vaccine. Adults who wish to be protected from this disease, have certain high-risk conditions, work with hepatitis A-infected animals, work in hepatitis A research labs, or travel to or work in countries with a high rate of hepatitis A should be immunized. Adults who were previously unvaccinated and who anticipate close contact with an international adoptee during the first 60 days after arrival in the [place] from a country with a high rate of hepatitis A should be immunized.  Hepatitis B vaccine. Adults who wish to be protected from this disease, have certain high-risk conditions, may be exposed to blood or other infectious body fluids, are household contacts or sex partners of hepatitis B positive people, are clients or workers in certain care facilities, or travel to or work in countries with a high rate of hepatitis B should be immunized.  Haemophilus influenzae type b (Hib) vaccine. A previously unvaccinated person with asplenia or sickle cell disease or having a scheduled splenectomy should receive 1 dose of Hib vaccine. Regardless of previous immunization, a recipient of a hematopoietic stem cell transplant should receive a 3-dose series 6-12 months after her successful transplant. Hib vaccine is not recommended for adults with HIV infection. Preventive Services / Frequency Ages 64 to 68 years  Blood pressure check.** / Every 1 to 2 years.  Lipid and cholesterol check.** / Every 5 years beginning at age  22.  Clinical breast exam.** / Every 3 years for women in their 88s and 53s.  BRCA-related cancer risk assessment.** / For women who have family members with a BRCA-related cancer (breast, ovarian, tubal, or peritoneal cancers).  Pap test.** / Every 2 years from ages 90 through 51. Every 3 years starting at age 21 through age 56 or 3 with a history of 3 consecutive normal Pap tests.  HPV screening.** / Every 3 years from ages 24 through ages 1 to 46 with a history of 3 consecutive normal Pap tests.  Hepatitis C blood test.** / For any individual with known risks for hepatitis C.  Skin self-exam. / Monthly.  Influenza vaccine. / Every year.  Tetanus, diphtheria, and acellular pertussis (Tdap, Td) vaccine.** / Consult your health care provider. Pregnant women should receive 1 dose of Tdap vaccine during each pregnancy. 1 dose of Td every 10 years.  Varicella vaccine.** / Consult your health care provider. Pregnant females who do not have evidence of immunity should receive the first dose after pregnancy.  HPV vaccine. / 3 doses over 6 months, if 72 and younger. The vaccine is not recommended for use in pregnant females. However, pregnancy testing is not needed before receiving a dose.  Measles, mumps, rubella (MMR) vaccine.** / You need at least 1 dose of MMR if you were born in 1957 or later. You may also need a 2nd dose. For females of childbearing age, rubella immunity should be determined. If there is no evidence of immunity, females who are not pregnant should be vaccinated. If there is no evidence of immunity, females who are pregnant should delay immunization until after pregnancy.  Pneumococcal 13-valent conjugate (PCV13) vaccine.** / Consult your health care provider.  Pneumococcal polysaccharide (PPSV23) vaccine.** / 1 to 2 doses if you smoke cigarettes or if you have certain conditions.  Meningococcal vaccine.** /  1 dose if you are age 19 to 21 years and a first-year college  student living in a residence hall, or have one of several medical conditions, you need to get vaccinated against meningococcal disease. You may also need additional booster doses.  Hepatitis A vaccine.** / Consult your health care provider.  Hepatitis B vaccine.** / Consult your health care provider.  Haemophilus influenzae type b (Hib) vaccine.** / Consult your health care provider. Ages 40 to 64 years  Blood pressure check.** / Every 1 to 2 years.  Lipid and cholesterol check.** / Every 5 years beginning at age 20 years.  Lung cancer screening. / Every year if you are aged 55-80 years and have a 30-pack-year history of smoking and currently smoke or have quit within the past 15 years. Yearly screening is stopped once you have quit smoking for at least 15 years or develop a health problem that would prevent you from having lung cancer treatment.  Clinical breast exam.** / Every year after age 40 years.  BRCA-related cancer risk assessment.** / For women who have family members with a BRCA-related cancer (breast, ovarian, tubal, or peritoneal cancers).  Mammogram.** / Every year beginning at age 40 years and continuing for as long as you are in good health. Consult with your health care provider.  Pap test.** / Every 3 years starting at age 30 years through age 65 or 70 years with a history of 3 consecutive normal Pap tests.  HPV screening.** / Every 3 years from ages 30 years through ages 65 to 70 years with a history of 3 consecutive normal Pap tests.  Fecal occult blood test (FOBT) of stool. / Every year beginning at age 50 years and continuing until age 75 years. You may not need to do this test if you get a colonoscopy every 10 years.  Flexible sigmoidoscopy or colonoscopy.** / Every 5 years for a flexible sigmoidoscopy or every 10 years for a colonoscopy beginning at age 50 years and continuing until age 75 years.  Hepatitis C blood test.** / For all people born from 1945 through  1965 and any individual with known risks for hepatitis C.  Skin self-exam. / Monthly.  Influenza vaccine. / Every year.  Tetanus, diphtheria, and acellular pertussis (Tdap/Td) vaccine.** / Consult your health care provider. Pregnant women should receive 1 dose of Tdap vaccine during each pregnancy. 1 dose of Td every 10 years.  Varicella vaccine.** / Consult your health care provider. Pregnant females who do not have evidence of immunity should receive the first dose after pregnancy.  Zoster vaccine.** / 1 dose for adults aged 60 years or older.  Measles, mumps, rubella (MMR) vaccine.** / You need at least 1 dose of MMR if you were born in 1957 or later. You may also need a 2nd dose. For females of childbearing age, rubella immunity should be determined. If there is no evidence of immunity, females who are not pregnant should be vaccinated. If there is no evidence of immunity, females who are pregnant should delay immunization until after pregnancy.  Pneumococcal 13-valent conjugate (PCV13) vaccine.** / Consult your health care provider.  Pneumococcal polysaccharide (PPSV23) vaccine.** / 1 to 2 doses if you smoke cigarettes or if you have certain conditions.  Meningococcal vaccine.** / Consult your health care provider.  Hepatitis A vaccine.** / Consult your health care provider.  Hepatitis B vaccine.** / Consult your health care provider.  Haemophilus influenzae type b (Hib) vaccine.** / Consult your health care provider. Ages 65   years and over  Blood pressure check.** / Every 1 to 2 years.  Lipid and cholesterol check.** / Every 5 years beginning at age 22 years.  Lung cancer screening. / Every year if you are aged 73-80 years and have a 30-pack-year history of smoking and currently smoke or have quit within the past 15 years. Yearly screening is stopped once you have quit smoking for at least 15 years or develop a health problem that would prevent you from having lung cancer  treatment.  Clinical breast exam.** / Every year after age 4 years.  BRCA-related cancer risk assessment.** / For women who have family members with a BRCA-related cancer (breast, ovarian, tubal, or peritoneal cancers).  Mammogram.** / Every year beginning at age 40 years and continuing for as long as you are in good health. Consult with your health care provider.  Pap test.** / Every 3 years starting at age 9 years through age 34 or 91 years with 3 consecutive normal Pap tests. Testing can be stopped between 65 and 70 years with 3 consecutive normal Pap tests and no abnormal Pap or HPV tests in the past 10 years.  HPV screening.** / Every 3 years from ages 57 years through ages 64 or 45 years with a history of 3 consecutive normal Pap tests. Testing can be stopped between 65 and 70 years with 3 consecutive normal Pap tests and no abnormal Pap or HPV tests in the past 10 years.  Fecal occult blood test (FOBT) of stool. / Every year beginning at age 15 years and continuing until age 17 years. You may not need to do this test if you get a colonoscopy every 10 years.  Flexible sigmoidoscopy or colonoscopy.** / Every 5 years for a flexible sigmoidoscopy or every 10 years for a colonoscopy beginning at age 86 years and continuing until age 71 years.  Hepatitis C blood test.** / For all people born from [birth year] through 1963 and any individual with known risks for hepatitis C.  Osteoporosis screening.** / A one-time screening for women ages 83 years and over and women at risk for fractures or osteoporosis.  Skin self-exam. / Monthly.  Influenza vaccine. / Every year.  Tetanus, diphtheria, and acellular pertussis (Tdap/Td) vaccine.** / 1 dose of Td every 10 years.  Varicella vaccine.** / Consult your health care provider.  Zoster vaccine.** / 1 dose for adults aged 61 years or older.  Pneumococcal 13-valent conjugate (PCV13) vaccine.** / Consult your health care provider.  Pneumococcal  polysaccharide (PPSV23) vaccine.** / 1 dose for all adults aged 28 years and older.  Meningococcal vaccine.** / Consult your health care provider.  Hepatitis A vaccine.** / Consult your health care provider.  Hepatitis B vaccine.** / Consult your health care provider.  Haemophilus influenzae type b (Hib) vaccine.** / Consult your health care provider. ** Family history and personal history of risk and conditions may change your health care provider's recommendations. Document Released: 09/08/2001 Document Revised: 11/27/2013 Document Reviewed: 12/08/2010 [facility] Patient Information 2015 [place], [place]. This information is not intended to replace advice given to you by your health care provider. Make sure you discuss any questions you have with your health care provider.

## 2014-10-03 NOTE — Progress Notes (Signed)
Pre visit review using our clinic review tool, if applicable. No additional management support is needed unless otherwise documented below in the visit note. 

## 2014-10-04 ENCOUNTER — Encounter: Payer: Self-pay | Admitting: Internal Medicine

## 2014-10-04 LAB — HEPATITIS B SURFACE ANTIBODY,QUALITATIVE: Hep B S Ab: NEGATIVE

## 2014-10-04 LAB — HEPATITIS A ANTIBODY, TOTAL: HEP A TOTAL AB: NONREACTIVE

## 2014-10-04 LAB — HEPATITIS C ANTIBODY: HCV Ab: NEGATIVE

## 2014-10-04 LAB — HEPATITIS B CORE ANTIBODY, TOTAL: HEP B C TOTAL AB: NONREACTIVE

## 2014-10-04 NOTE — Assessment & Plan Note (Signed)
Her LDL is mildly elevated She does not have any compelling risk factors for CAD or CVA Will not start a statin at her request

## 2014-10-04 NOTE — Assessment & Plan Note (Signed)
She will cont the PPI as needed

## 2014-10-04 NOTE — Assessment & Plan Note (Signed)

## 2014-10-04 NOTE — Assessment & Plan Note (Signed)
LFT's remain very slightly elevated Viral Hep panel is negative I think this is fatty liver I have asked her to start the Hep a A and B vaccines

## 2014-10-04 NOTE — Progress Notes (Signed)
Subjective:    Patient ID: Stacey Navarro, female    DOB: 11-Dec-1940, 74 y.o.   MRN: [id number]  Hyperlipidemia This is a chronic problem. The current episode started more than 1 year ago. The problem is uncontrolled. Recent lipid tests were reviewed and are variable. Exacerbating diseases include obesity. She has no history of chronic renal disease, diabetes, hypothyroidism, liver disease or nephrotic syndrome. Factors aggravating her hyperlipidemia include fatty foods. Pertinent negatives include no chest pain, focal sensory loss, focal weakness, leg pain, myalgias or shortness of breath. Current antihyperlipidemic treatment includes exercise and diet change. The current treatment provides mild improvement of lipids.      Review of Systems  Constitutional: Negative.  Negative for fever, chills, diaphoresis, appetite change and fatigue.  HENT: Negative.   Eyes: Negative.   Respiratory: Negative.  Negative for cough, choking, chest tightness, shortness of breath and stridor.   Cardiovascular: Negative.  Negative for chest pain, palpitations and leg swelling.  Gastrointestinal: Negative.  Negative for nausea, vomiting, abdominal pain, diarrhea, constipation and blood in stool.  Endocrine: Negative.   Genitourinary: Negative.   Musculoskeletal: Negative.  Negative for myalgias, back pain, arthralgias and neck pain.  Skin: Negative.   Allergic/Immunologic: Negative.   Neurological: Negative.  Negative for focal weakness.  Hematological: Negative.  Negative for adenopathy. Does not bruise/bleed easily.  Psychiatric/Behavioral: Negative.  Negative for sleep disturbance, dysphoric mood and decreased concentration. The patient is not nervous/anxious.        Objective:   Physical Exam  Constitutional: She is oriented to person, place, and time. She appears well-developed and well-nourished. No distress.  HENT:  Head: Normocephalic and atraumatic.  Mouth/Throat: Oropharynx is clear and  moist. No oropharyngeal exudate.  Eyes: Conjunctivae are normal. Right eye exhibits no discharge. Left eye exhibits no discharge. No scleral icterus.  Neck: Normal range of motion. Neck supple. No JVD present. No tracheal deviation present. No thyromegaly present.  Cardiovascular: Normal rate, regular rhythm, normal heart sounds and intact distal pulses.  Exam reveals no gallop and no friction rub.   No murmur heard. Pulmonary/Chest: Effort normal and breath sounds normal. No stridor. No respiratory distress. She has no wheezes. She has no rales. She exhibits no tenderness.  Abdominal: Soft. Bowel sounds are normal. She exhibits no distension and no mass. There is no tenderness. There is no rebound and no guarding. Hernia confirmed negative in the right inguinal area and confirmed negative in the left inguinal area.  Genitourinary: Rectum normal, vagina normal and uterus normal. Rectal exam shows no external hemorrhoid, no internal hemorrhoid, no fissure, no mass, no tenderness and anal tone normal. Guaiac negative stool. No breast swelling, tenderness, discharge or bleeding. Pelvic exam was performed with patient supine. No labial fusion. There is no rash, tenderness, lesion or injury on the right labia. There is no rash, tenderness, lesion or injury on the left labia. Uterus is not deviated, not enlarged, not fixed and not tender. Cervix exhibits no motion tenderness, no discharge and no friability. Right adnexum displays no mass, no tenderness and no fullness. Left adnexum displays no mass, no tenderness and no fullness. No erythema, tenderness or bleeding in the vagina. No foreign body around the vagina. No signs of injury around the vagina. No vaginal discharge found.  Musculoskeletal: Normal range of motion. She exhibits no edema or tenderness.  Lymphadenopathy:    She has no cervical adenopathy.       Right: No inguinal adenopathy present.  Left: No inguinal adenopathy present.    Neurological: She is oriented to person, place, and time.  Skin: Skin is warm and dry. No rash noted. She is not diaphoretic. No erythema. No pallor.  Psychiatric: She has a normal mood and affect. Her behavior is normal. Judgment and thought content normal.  Vitals reviewed.    Lab Results  Component Value Date   WBC 9.9 10/03/2014   HGB 15.2* 10/03/2014   HCT 44.4 10/03/2014   PLT 297.0 10/03/2014   GLUCOSE 98 10/03/2014   CHOL 230* 10/03/2014   TRIG 94.0 10/03/2014   HDL 65.50 10/03/2014   LDLDIRECT 158.4 07/17/2011   LDLCALC 146* 10/03/2014   ALT 48* 10/03/2014   AST 33 10/03/2014   NA 138 10/03/2014   K 4.1 10/03/2014   CL 103 10/03/2014   CREATININE 0.92 10/03/2014   BUN 17 10/03/2014   CO2 27 10/03/2014   TSH 3.21 10/03/2014       Assessment & Plan:

## 2014-10-05 ENCOUNTER — Telehealth: Payer: Self-pay

## 2014-10-05 LAB — CYTOLOGY - PAP

## 2014-10-05 LAB — HM PAP SMEAR: HM Pap smear: NORMAL

## 2014-10-08 NOTE — Telephone Encounter (Signed)
Declined flu shot

## 2014-10-17 ENCOUNTER — Ambulatory Visit
Admission: RE | Admit: 2014-10-17 | Discharge: 2014-10-17 | Disposition: A | Discharge status: Home / Self Care | Payer: Medicare Other | Source: Ambulatory Visit | Attending: Internal Medicine | Admitting: Internal Medicine

## 2014-10-17 DIAGNOSIS — Z1231 Encounter for screening mammogram for malignant neoplasm of breast: Secondary | ICD-10-CM

## 2014-10-17 LAB — HM MAMMOGRAPHY: HM MAMMO: ABNORMAL

## 2014-10-18 ENCOUNTER — Other Ambulatory Visit: Payer: Self-pay | Admitting: Internal Medicine

## 2014-10-18 DIAGNOSIS — R928 Other abnormal and inconclusive findings on diagnostic imaging of breast: Secondary | ICD-10-CM

## 2014-10-19 ENCOUNTER — Other Ambulatory Visit: Payer: Self-pay | Admitting: Internal Medicine

## 2014-10-19 ENCOUNTER — Ambulatory Visit
Admission: RE | Admit: 2014-10-19 | Discharge: 2014-10-19 | Disposition: A | Discharge status: Home / Self Care | Payer: Medicare Other | Source: Ambulatory Visit | Attending: Internal Medicine | Admitting: Internal Medicine

## 2014-10-19 DIAGNOSIS — R928 Other abnormal and inconclusive findings on diagnostic imaging of breast: Secondary | ICD-10-CM

## 2014-10-23 ENCOUNTER — Other Ambulatory Visit: Payer: Medicare Other

## 2014-10-23 ENCOUNTER — Encounter: Payer: Self-pay | Admitting: *Deleted

## 2014-10-23 ENCOUNTER — Telehealth: Payer: Self-pay | Admitting: *Deleted

## 2014-10-23 DIAGNOSIS — C50211 Malignant neoplasm of upper-inner quadrant of right female breast: Secondary | ICD-10-CM

## 2014-10-23 HISTORY — DX: Malignant neoplasm of upper-inner quadrant of right female breast: C50.211

## 2014-10-23 NOTE — Telephone Encounter (Signed)
Confirmed [facility] for 10/31/14 at 0800 .  Instructions and contact information given.

## 2014-10-31 ENCOUNTER — Ambulatory Visit: Payer: Medicare Other

## 2014-10-31 ENCOUNTER — Encounter: Payer: Self-pay | Admitting: Hematology and Oncology

## 2014-10-31 ENCOUNTER — Other Ambulatory Visit (HOSPITAL_BASED_OUTPATIENT_CLINIC_OR_DEPARTMENT_OTHER): Payer: Medicare Other

## 2014-10-31 ENCOUNTER — Other Ambulatory Visit: Payer: Self-pay | Admitting: General Surgery

## 2014-10-31 ENCOUNTER — Encounter: Payer: Self-pay | Admitting: Skilled Nursing Facility1

## 2014-10-31 ENCOUNTER — Encounter: Payer: Self-pay | Admitting: Physical Therapy

## 2014-10-31 ENCOUNTER — Ambulatory Visit (HOSPITAL_BASED_OUTPATIENT_CLINIC_OR_DEPARTMENT_OTHER): Payer: Medicare Other | Admitting: Hematology and Oncology

## 2014-10-31 ENCOUNTER — Ambulatory Visit: Payer: Medicare Other | Attending: General Surgery | Admitting: Physical Therapy

## 2014-10-31 ENCOUNTER — Encounter: Payer: Self-pay | Admitting: *Deleted

## 2014-10-31 ENCOUNTER — Ambulatory Visit
Admission: RE | Admit: 2014-10-31 | Discharge: 2014-10-31 | Disposition: A | Discharge status: Home / Self Care | Payer: Medicare Other | Source: Ambulatory Visit | Attending: Radiation Oncology | Admitting: Radiation Oncology

## 2014-10-31 VITALS — BP 140/90 | HR 79 | Temp 97.9°F | Resp 18 | Ht 66.0 in | Wt 217.5 lb

## 2014-10-31 DIAGNOSIS — C50211 Malignant neoplasm of upper-inner quadrant of right female breast: Secondary | ICD-10-CM

## 2014-10-31 DIAGNOSIS — C50911 Malignant neoplasm of unspecified site of right female breast: Secondary | ICD-10-CM

## 2014-10-31 DIAGNOSIS — R293 Abnormal posture: Secondary | ICD-10-CM | POA: Diagnosis not present

## 2014-10-31 LAB — COMPREHENSIVE METABOLIC PANEL (CC13)
ALBUMIN: 3.7 g/dL (ref 3.5–5.0)
ALK PHOS: 112 U/L (ref 40–150)
ALT: 48 U/L (ref 0–55)
AST: 33 U/L (ref 5–34)
Anion Gap: 10 mEq/L (ref 3–11)
BUN: 18.9 mg/dL (ref 7.0–26.0)
CO2: 24 meq/L (ref 22–29)
Calcium: 9.4 mg/dL (ref 8.4–10.4)
Chloride: 108 mEq/L (ref 98–109)
Creatinine: 1 mg/dL (ref 0.6–1.1)
EGFR: 58 mL/min/{1.73_m2} — AB (ref >90)
GLUCOSE: 119 mg/dL (ref 70–140)
POTASSIUM: 4.6 meq/L (ref 3.5–5.1)
Sodium: 142 mEq/L (ref 136–145)
TOTAL PROTEIN: 7.1 g/dL (ref 6.4–8.3)
Total Bilirubin: 0.41 mg/dL (ref 0.20–1.20)

## 2014-10-31 LAB — CBC WITH DIFFERENTIAL/PLATELET
BASO%: 0.9 % (ref 0.0–2.0)
BASOS ABS: 0.1 10*3/uL (ref 0.0–0.1)
EOS ABS: 0.2 10*3/uL (ref 0.0–0.5)
EOS%: 2.7 % (ref 0.0–7.0)
HCT: 45.7 % (ref 34.8–46.6)
HEMOGLOBIN: 15 g/dL (ref 11.6–15.9)
LYMPH%: 29.2 % (ref 14.0–49.7)
MCH: 30.9 pg (ref 25.1–34.0)
MCHC: 32.7 g/dL (ref 31.5–36.0)
MCV: 94.4 fL (ref 79.5–101.0)
MONO#: 0.7 10*3/uL (ref 0.1–0.9)
MONO%: 9.9 % (ref 0.0–14.0)
NEUT%: 57.3 % (ref 38.4–76.8)
NEUTROS ABS: 4.2 10*3/uL (ref 1.5–6.5)
PLATELETS: 245 10*3/uL (ref 145–400)
RBC: 4.84 10*6/uL (ref 3.70–5.45)
RDW: 13.9 % (ref 11.2–14.5)
WBC: 7.3 10*3/uL (ref 3.9–10.3)
lymph#: 2.1 10*3/uL (ref 0.9–3.3)

## 2014-10-31 NOTE — Progress Notes (Signed)
Checked in new pt with no financial concerns at this time.  Informed pt if chemo is part of her treatment I will contact her ins to see if Josem Kaufmann is req and will obtain it if it is as well as contact foundations that offer copay assistance if needed.  Pt has my card for any billing questions or concerns.

## 2014-10-31 NOTE — Progress Notes (Signed)
[facility] Radiation Oncology NEW PATIENT EVALUATION  Name: Stacey Navarro MRN: [id number]  Date:   10/31/2014           DOB: 12/11/1940  Status: outpatient   CC: Thomas Jones, MD  Ingram, Haywood, MD    REFERRING PHYSICIAN: Ingram, Haywood, MD   DIAGNOSIS: Clinical stage IIA (T2 N0 M0) invasive ductal carcinoma of the right breast   HISTORY OF PRESENT ILLNESS:  Stacey Navarro is a 74 y.o. female who is seen today at the [facility] through the courtesy of Dr. Ingram for evaluation of her T2 N0 invasive ductal carcinoma of the right breast.  At the [facility] of [facility] she was felt to have a possible mass within the right breast.  Additional views and ultrasound showed a medial right breast mass at approximately 1:00 measuring 3.1 cm within the upper inner quadrant.  Ultrasound-guided biopsy on 10/19/2014 was diagnostic for invasive ductal carcinoma.  This was ER positive at 100%, PR positive at 40% with a Ki-67 of 28%.  The tumor was felt to be grade 2-3.  There is no MRI scan.  PREVIOUS RADIATION THERAPY: No   PAST MEDICAL HISTORY:  has a past medical history of GERD (gastroesophageal reflux disease); Hyperlipidemia; Arthritis (2010); and Breast cancer of upper-inner quadrant of right female breast (10/23/2014).     PAST SURGICAL HISTORY:  Past Surgical History  Procedure Laterality Date  . Cholecystectomy  2011  . Tonsillectomy  1948     FAMILY HISTORY: family history includes Alcohol abuse in her father; Breast cancer in her maternal aunt; Hypertension in her mother.  A maternal aunt had breast cancer in her 50s.  Her father died of some type of bone malignancy at age 82.  Her mother died from old age at 96.   SOCIAL HISTORY:  reports that she has quit smoking. Her smoking use included Cigarettes. She smoked 1.00 pack per day. She has never used smokeless tobacco. She reports that she drinks about 1.2 oz of alcohol per week.  She reports that she does not use illicit drugs.  Single, one child.  Retired accountant.   ALLERGIES: Review of patient's allergies indicates no known allergies.   MEDICATIONS:  Current Outpatient Prescriptions  Medication Sig Dispense Refill  . pantoprazole (PROTONIX) 40 MG tablet TAKE 1 TABLET (40 MG TOTAL) BY MOUTH DAILY. 30 tablet 11   No current facility-administered medications for this encounter.     REVIEW OF SYSTEMS:  Pertinent items are noted in HPI.    PHYSICAL EXAM:  Wt Readings from Last 3 Encounters:  10/31/14 217 lb 8 oz (98.657 kg)  10/03/14 219 lb (99.338 kg)  06/05/14 195 lb (88.451 kg)   Temp Readings from Last 3 Encounters:  10/31/14 97.9 F (36.6 C) Oral  10/03/14 98.5 F (36.9 C) Oral  06/06/12 97.3 F (36.3 C) Oral   BP Readings from Last 3 Encounters:  10/31/14 140/90  10/03/14 128/82  06/05/14 121/77   Pulse Readings from Last 3 Encounters:  10/31/14 79  10/03/14 98  06/05/14 67   Head and neck examination: Grossly unremarkable.  Nodes: There is no palpable cervical, supraclavicular, or axillary lymphadenopathy.  Chest: Lungs clear.  Breasts: There is a punctate biopsy wound at approximately 3:00 with an ill-defined mass along the upper inner quadrant of the right breast over an area of 2-3 cm.  Left breast without masses or lesions.  Extremities: Without edema.      LABORATORY DATA:  Lab Results  Component Value Date   WBC 7.3 10/31/2014   HGB 15.0 10/31/2014   HCT 45.7 10/31/2014   MCV 94.4 10/31/2014   PLT 245 10/31/2014   Lab Results  Component Value Date   NA 142 10/31/2014   K 4.6 10/31/2014   CL 103 10/03/2014   CO2 24 10/31/2014   Lab Results  Component Value Date   ALT 48 10/31/2014   AST 33 10/31/2014   ALKPHOS 112 10/31/2014   BILITOT 0.41 10/31/2014      IMPRESSION: Clinical stage IIA (T2 N0 M0) invasive ductal carcinoma of the right breast.  We discussed local treatment options including mastectomy versus  partial mastectomy followed by radiation therapy.  She desires breast preservation.  She will need a sentinel lymph node biopsy.  Dr. Dalbert Batman will make a determination as to whether not he would like to proceed with a partial mastectomy at this time based on the size of her tumor and breast.  The possibility of neoadjuvant therapy could be entertained if he feels that the tumor size is significant.  We discussed the potential acute and late toxicities of radiation therapy and the possible need for standard fractionation based on her breast size.   PLAN: As discussed above.  Dr. Dalbert Batman will determine the need for any possible neoadjuvant therapy.  I suspect that he can proceed with a partial mastectomy and sentinel lymph node biopsy, but I defer to his judgment.   I spent 30  minutes face to face with the patient and more than 50% of that time was spent in counseling and/or coordination of care.

## 2014-10-31 NOTE — Progress Notes (Signed)
Clinical Social Work [place] Psychosocial Distress Screening [place]  Patient completed distress screening protocol and scored a 5 on the Psychosocial Distress Thermometer which indicates moderate distress. Clinical Social Worker met with patient and patients daughter in [facility] to assess for distress and other psychosocial needs. Patient stated she was "ok" and felt comfortable in her treatment plan and treatment team. CSW and patient discussed common feeling and emotions when being diagnosed with cancer. CSW and patient discussed the importance of support during treatment, and CSW informed patient of the support team and support services at [facility] CSW provided contact information and encouraged patient to call with any questions or concerns.      ON[facility]N DISTRESS SCREENING 10/31/2014  Screening Type Initial Screening  Distress experienced in past week (1-10) 5  Information Concerns Type Lack of info about diagnosis;Lack of info about treatment  Physician notified of physical symptoms Yes  Referral to clinical psychology No  Referral to clinical social work Yes  Referral to dietition No  Referral to financial advocate No  Referral to support programs No  Referral to palliative care No   Johnnye Lana, MSW, LCSW, OSW-C Clinical Social Worker [place] [phone]

## 2014-10-31 NOTE — Progress Notes (Signed)
[facility] NOTE  Patient Care Team: [name], MD as PCP - General (Internal Medicine) [name], MD as Consulting Physician (General Surgery) [name], MD as Consulting Physician (Hematology and Oncology) [name], MD as Consulting Physician (Radiation Oncology) Mauro Kaufmann, RN as Registered Nurse Rockwell Germany, RN as Registered Nurse  CHIEF COMPLAINTS/PURPOSE OF CONSULTATION:  Newly diagnosed breast cancer  HISTORY OF PRESENTING ILLNESS:  Stacey Navarro 74 y.o. female is here because of recent diagnosis of right breast cancer. She had routine screening mammogram the detected an abnormality in the right breast measuring 3.1 cm by ultrasound. It was upper inner quadrant. She had a biopsy that showed invasive ductal carcinoma grade 2-3, ER 100%, PR 48%, HER-2 negative ratio 0.6, Ki-67 28%. She was presented this morning at the multidisciplinary tumor board and she is here today to discuss the treatment plan. She is accompanied by her daughter. Both of them live together supporting each other.   I reviewed her records extensively and collaborated the history with the patient.  SUMMARY OF ONCOLOGIC HISTORY:   Breast cancer of upper-inner quadrant of right female breast   10/19/2014 Mammogram Right breast mass 3.1 cm upper quadrant 6 cm from the nipple   10/19/2014 Initial Diagnosis Right breast invasive ductal carcinoma grade 2-3, ER 100%, PR 48%, HER-2 negative ratio 0.6, Ki-67 28%    In terms of breast cancer risk profile:  She menarched at early age of 86 and went to menopause at age 78  She had one pregnancy, her first child was born at age 56  She did not receive birth control pills.  She was never exposed to fertility medications or hormone replacement therapy.  She has family history of Breast/GYN/GI cancer  MEDICAL HISTORY:  Past Medical History  Diagnosis Date  . GERD (gastroesophageal reflux disease)   . Hyperlipidemia   .  Arthritis 2010    OA in knees  . Breast cancer of upper-inner quadrant of right female breast 10/23/2014    SURGICAL HISTORY: Past Surgical History  Procedure Laterality Date  . Cholecystectomy  2011  . Tonsillectomy  1948    SOCIAL HISTORY: History   Social History  . Marital Status: Widowed    Spouse Name: [name]  . Number of Children: [name]  . Years of Education: [name]   Occupational History  . Not on file.   Social History Main Topics  . Smoking status: Former Smoker -- 1.00 packs/day    Types: Cigarettes  . Smokeless tobacco: Never Used  . Alcohol Use: 1.2 oz/week    2 Glasses of wine per week  . Drug Use: No  . Sexual Activity: Not Currently    Birth Control/ Protection: Post-menopausal   Other Topics Concern  . Not on file   Social History Narrative    FAMILY HISTORY: Family History  Problem Relation Age of Onset  . Hypertension Mother   . Alcohol abuse Father   . Breast cancer Maternal Aunt     ALLERGIES:  has No Known Allergies.  MEDICATIONS:  Current Outpatient Prescriptions  Medication Sig Dispense Refill  . pantoprazole (PROTONIX) 40 MG tablet TAKE 1 TABLET (40 MG TOTAL) BY MOUTH DAILY. 30 tablet 11   No current facility-administered medications for this visit.    REVIEW OF SYSTEMS:   Constitutional: Denies fevers, chills or abnormal night sweats Eyes: Denies blurriness of vision, double vision or watery eyes Ears, nose, mouth, throat, and face: Denies mucositis or sore  throat Respiratory: Denies cough, dyspnea or wheezes Cardiovascular: Denies palpitation, chest discomfort or lower extremity swelling Gastrointestinal:  Denies nausea, heartburn or change in bowel habits Skin: Denies abnormal skin rashes Lymphatics: Denies new lymphadenopathy or easy bruising Neurological:Denies numbness, tingling or new weaknesses Behavioral/Psych: Mood is stable, no new changes  Breast:  Denies any palpable lumps or discharge All other systems were reviewed  with the patient and are negative.  PHYSICAL EXAMINATION: ECOG PERFORMANCE STATUS: 0 - Asymptomatic  Filed Vitals:   10/31/14 0833  BP: 140/90  Pulse: 79  Temp: 97.9 F (36.6 C)  Resp: 18   Filed Weights   10/31/14 0833  Weight: 217 lb 8 oz (98.657 kg)    GENERAL:alert, no distress and comfortable SKIN: skin color, texture, turgor are normal, no rashes or significant lesions EYES: normal, conjunctiva are pink and non-injected, sclera clear OROPHARYNX:no exudate, no erythema and lips, buccal mucosa, and tongue normal  NECK: supple, thyroid normal size, non-tender, without nodularity LYMPH:  no palpable lymphadenopathy in the cervical, axillary or inguinal LUNGS: clear to auscultation and percussion with normal breathing effort HEART: regular rate & rhythm and no murmurs and no lower extremity edema ABDOMEN:abdomen soft, non-tender and normal bowel sounds Musculoskeletal:no cyanosis of digits and no clubbing  PSYCH: alert & oriented x 3 with fluent speech NEURO: no focal motor/sensory deficits BREAST: No palpable nodules in breast. No palpable axillary or supraclavicular lymphadenopathy (exam performed in the presence of a chaperone)   LABORATORY DATA:  I have reviewed the data as listed Lab Results  Component Value Date   WBC 7.3 10/31/2014   HGB 15.0 10/31/2014   HCT 45.7 10/31/2014   MCV 94.4 10/31/2014   PLT 245 10/31/2014   Lab Results  Component Value Date   NA 142 10/31/2014   K 4.6 10/31/2014   CL 103 10/03/2014   CO2 24 10/31/2014    RADIOGRAPHIC STUDIES: I have personally reviewed the radiological reports and agreed with the findings in the report. Results are summarized as above  ASSESSMENT AND PLAN:  Breast cancer of upper-inner quadrant of right female breast Right breast invasive ductal carcinoma diagnosed with screening mammogram, 3.1 cm by ultrasound in the upper quadrant 6 cm from the nipple, grade 2-3, EF 100%, PR 48%, HER-2 negative ratio 0.6,  Ki-67 was 28%  Pathology and radiology review:Discussed with the patient, the details of pathology including the type of breast cancer,the clinical staging, the significance of ER, PR and HER-2/neu receptors and the implications for treatment. After reviewing the pathology in detail, we proceeded to discuss the different treatment options between surgery, radiation, chemotherapy, antiestrogen therapies.  Recommendation: 1. Breast conserving surgery followed by 2. Oncotype DX testing to evaluate benefit to chemotherapy followed by 3. Adjuvant radiation therapy followed by 4. Adjuvant antiestrogen therapy with aromatase inhibitors for 5 years   Oncotype DX counseling: I discussed Oncotype DX test. I explained to the patient that this is a 21 gene panel to evaluate patient tumors DNA to calculate recurrence score. This would help determine whether patient has high risk or intermediate risk or low risk breast cancer. She understands that if her tumor was found to be high risk, she would benefit from systemic chemotherapy. If low risk, no need of chemotherapy. If she was found to be intermediate risk, we would need to evaluate the score as well as other risk factors and determine if an abbreviated chemotherapy may be of benefit.  Return to clinic after surgery to discuss final pathology report  and to decide whether Oncotype needs to be sent.  Patient has several vacations planned and we will need to work around these times. [place] for [place] Day weekend May 26-29 Camping trip June 29 to July 4 Granddaughter's wedding August 15-20  If she does not need chemotherapy, she might want to delay radiation therapy until she returns back from her camping trip. If she does that, I will then plan to initiate antiestrogen therapy for that 6-8 weeks that she is not going to be treated with radiation. We will see what Oncotype result shows before deciding on adjuvant treatment plan.  All questions were  answered. The patient knows to call the clinic with any problems, questions or concerns.    Rulon Eisenmenger, MD 11:33 AM

## 2014-10-31 NOTE — Assessment & Plan Note (Addendum)
Right breast invasive ductal carcinoma diagnosed with screening mammogram, 3.1 cm by ultrasound in the upper quadrant 6 cm from the nipple, grade 2-3, EF 100%, PR 48%, HER-2 negative ratio 0.6, Ki-67 was 28%  Pathology and radiology review:Discussed with the patient, the details of pathology including the type of breast cancer,the clinical staging, the significance of ER, PR and HER-2/neu receptors and the implications for treatment. After reviewing the pathology in detail, we proceeded to discuss the different treatment options between surgery, radiation, chemotherapy, antiestrogen therapies.  Recommendation: 1. Breast conserving surgery followed by 2. Oncotype DX testing to evaluate benefit to chemotherapy followed by 3. Adjuvant radiation therapy followed by 4. Adjuvant antiestrogen therapy with aromatase inhibitors for 5 years   Oncotype DX counseling: I discussed Oncotype DX test. I explained to the patient that this is a 21 gene panel to evaluate patient tumors DNA to calculate recurrence score. This would help determine whether patient has high risk or intermediate risk or low risk breast cancer. She understands that if her tumor was found to be high risk, she would benefit from systemic chemotherapy. If low risk, no need of chemotherapy. If she was found to be intermediate risk, we would need to evaluate the score as well as other risk factors and determine if an abbreviated chemotherapy may be of benefit.  Return to clinic after surgery to discuss final pathology report and to decide whether Oncotype needs to be sent.  Patient has several vacations planned and we will need to work around these times. [place] for Memorial Day weekend May 26-29 Camping trip June 29 to July 4 Granddaughter's wedding August 15-20  If she does not need chemotherapy, she might want to delay radiation therapy until she returns back from her camping trip. If she does that, I will then plan to initiate  antiestrogen therapy for that 6-8 weeks that she is not going to be treated with radiation. We will see what Oncotype result shows before deciding on adjuvant treatment plan.

## 2014-10-31 NOTE — Patient Instructions (Signed)

## 2014-10-31 NOTE — Therapy (Signed)
[place], [place], [address] Phone: [phone]   Fax:  [phone]  Physical Therapy Evaluation  Patient Details  Name: Stacey Navarro MRN: [id number] Date of Birth: 1940-09-04 Referring Provider:  [name], MD  Encounter Date: 10/31/2014      PT End of Session - 10/31/14 1151    Visit Number 1   Number of Visits 1   PT Start Time [phone]   PT Stop Time 1000  and from 1030-1040   PT Time Calculation (min) 18 min   Activity Tolerance Patient tolerated treatment well   Behavior During Therapy [facility] for tasks assessed/performed      Past Medical History  Diagnosis Date  . GERD (gastroesophageal reflux disease)   . Hyperlipidemia   . Arthritis 2010    OA in knees  . Breast cancer of upper-inner quadrant of right female breast 10/23/2014    Past Surgical History  Procedure Laterality Date  . Cholecystectomy  2011  . Tonsillectomy  1948    There were no vitals filed for this visit.  Visit Diagnosis:  Breast cancer, right - Plan: PT plan of care cert/re-cert  Abnormal posture - Plan: PT plan of care cert/re-cert      Subjective Assessment - 10/31/14 1052    Subjective Patient was seen today for a baseline assessment of her newly diagnosed right breast cancer.   Patient is accompained by: Family member   Pertinent History Patient was diagnosed with right ER/PR positive, HER2 negative breast cancer, grade 2 invasive ductal carcinoma with the mass measuring 3.1 cm.   Patient Stated Goals Reduce lymphedema risk and learn post op ROM HEP   Currently in Pain? No/denies            [facility] PT Assessment - 10/31/14 0001    Assessment   Medical Diagnosis Right breast cancer   Onset Date 10/22/14   Precautions   Precautions Other (comment)  Active cancer   Restrictions   Weight Bearing Restrictions No   Balance Screen   Has the patient fallen in the past 6 months No   Has the patient  had a decrease in activity level because of a fear of falling?  No   Is the patient reluctant to leave their home because of a fear of falling?  No   Home Environment   Living Enviornment Private residence   [place]  lives with 62 y.o. daughter   Available Help at Discharge Friend(s)   Prior Function   Level of Independence Independent with basic ADLs   Vocation Retired   Leisure She goes to the [employer] 3x/wk and does 30 min of cardio   Cognition   Overall Cognitive Status Within Functional Limits for tasks assessed   Posture/Postural Control   Posture/Postural Control Postural limitations   Postural Limitations Rounded Shoulders;Forward head   ROM / Strength   AROM / PROM / Strength AROM;Strength   AROM   AROM Assessment Site Shoulder   Right/Left Shoulder Right;Left   Right Shoulder Extension 50 Degrees   Right Shoulder Flexion 154 Degrees   Right Shoulder ABduction 158 Degrees   Right Shoulder Internal Rotation 62 Degrees   Right Shoulder External Rotation 90 Degrees   Left Shoulder Extension 58 Degrees   Left Shoulder Flexion 152 Degrees   Left Shoulder ABduction 146 Degrees   Left Shoulder Internal Rotation 53 Degrees   Left Shoulder External Rotation 88 Degrees  LYMPHEDEMA/ONCOLOGY QUESTIONNAIRE - 10/31/14 1148    Type   Cancer Type Right breast   Lymphedema Assessments   Lymphedema Assessments Upper extremities   Right Upper Extremity Lymphedema   10 cm Proximal to Olecranon Process 33.3 cm   Olecranon Process 29.5 cm   10 cm Proximal to Ulnar Styloid Process 26.2 cm   Just Proximal to Ulnar Styloid Process 17.4 cm   Across Hand at [employer] 19 cm   At [place] of 2nd Digit 6.7 cm   Left Upper Extremity Lymphedema   10 cm Proximal to Olecranon Process 34.3 cm   Olecranon Process 29 cm   10 cm Proximal to Ulnar Styloid Process 25.1 cm   Just Proximal to Ulnar Styloid Process 17.2 cm   Across Hand at [employer] 18.5 cm   At  [place] of 2nd Digit 6.4 cm      Patient was instructed today in a home exercise program today for post op shoulder range of motion. These included active assist shoulder flexion in sitting, scapular retraction, wall walking with shoulder abduction, and hands behind head external rotation.  She was encouraged to do these twice a day, holding 3 seconds and repeating 5 times when permitted by her physician.           PT Education - 10/31/14 1150    Education provided Yes   Education Details Post op shoulder ROM HEP and lymphedema risk reduction   Person(s) Educated Patient;Child(ren)   Methods Explanation;Demonstration;Handout   Comprehension Returned demonstration;Verbalized understanding              Breast Clinic Goals - 10/31/14 1155    Patient will be able to verbalize understanding of pertinent lymphedema risk reduction practices relevant to her diagnosis specifically related to skin care.   Time 1   Period Days   Status Achieved   Patient will be able to return demonstrate and/or verbalize understanding of the post-op home exercise program related to regaining shoulder range of motion.   Time 1   Period Days   Status Achieved   Patient will be able to verbalize understanding of the importance of attending the postoperative After Breast Cancer Class for further lymphedema risk reduction education and therapeutic exercise.   Time 1   Period Days   Status Achieved              Plan - 10/31/14 1151    Clinical Impression Statement Patient is a pleasant woman recently diagnosed with right ER/PR positive, HER2 negative breast cancer.  She is planning to have a right lumpectomy with a sentinel node biopsy followed by oncotype testing, radiation and possibly anti-estrogen therapy.  She will benefit from post op PT to regain shoulder ROM and strength and reduce lymphedema risk.   Pt will benefit from skilled therapeutic intervention in order to improve on the following  deficits Decreased range of motion;Increased edema;Decreased knowledge of precautions;Impaired UE functional use;Pain;Decreased strength   Rehab Potential Good   Clinical Impairments Affecting Rehab Potential none   PT Frequency One time visit   PT Treatment/Interventions Patient/family education;Therapeutic exercise   Consulted and Agree with Plan of Care Patient;Family member/caregiver   Family Member Consulted daughter     Patient will follow up at outpatient cancer rehab if needed following surgery.  If the patient requires physical therapy at that time, a specific plan will be dictated and sent to the referring physician for approval. The patient was educated today on appropriate basic range  of motion exercises to begin post operatively and the importance of attending the After Breast Cancer class following surgery.  Patient was educated today on lymphedema risk reduction practices as it pertains to recommendations that will benefit the patient immediately following surgery.  She verbalized good understanding.  No additional physical therapy is indicated at this time.         G-Codes - November 20, 2014 1156    Functional Assessment Tool Used Clinical Judgement   Functional Limitation Other PT primary   Other PT Primary Current Status (Z0258) At least 1 percent but less than 20 percent impaired, limited or restricted   Other PT Primary Goal Status (N2778) At least 1 percent but less than 20 percent impaired, limited or restricted   Other PT Primary Discharge Status (E4235) At least 1 percent but less than 20 percent impaired, limited or restricted       Problem List Patient Active Problem List   Diagnosis Date Noted  . Breast cancer of upper-inner quadrant of right female breast 10/23/2014  . Elevated LFTs 10/03/2014  . Osteoarthritis of right knee 12/14/2012  . GERD (gastroesophageal reflux disease) 07/17/2011  . Pure hypercholesterolemia 07/17/2011  . Visit for screening mammogram  07/17/2011  . Routine general medical examination at a health care facility 07/17/2011    [facility], PT 20-Nov-2014, 11:58 AM  [place], [place], [address] Phone: [phone]   Fax:  [phone]

## 2014-10-31 NOTE — Progress Notes (Signed)
Ms. Stacey Navarro is a pleasant 74 y.o. female from [place], [place] with newly diagnosed grade 2-3 invasive ductal carcinoma of the right breast.  Biopsy results revealed the tumor's prognostic profile is ER positive, PR positive, and HER2/neu negative. Ki67 is 28%.  She presents today with her daughter to the [facility]) for treatment consideration and recommendations from the breast surgeon, radiation oncologist, and medical oncologist.     I briefly met with Ms. Stacey Navarro and her daughter during her [facility] visit today. We discussed the purpose of the Survivorship Clinic, which will include monitoring for recurrence, coordinating completion of age and gender-appropriate cancer screenings, promotion of overall wellness, as well as managing potential late/long-term side effects of anti-cancer treatments.    The treatment plan for Ms. Stacey Navarro will likely include surgery, radiation therapy, and anti-estrogen therapy.  As of today, the intent of treatment for Ms. Stacey Navarro is cure, therefore she will be eligible for the Survivorship Clinic upon her completion of treatment.  Her survivorship care plan (SCP) document will be drafted and updated throughout the course of her treatment trajectory. She will receive the SCP in an office visit with myself in the Survivorship Clinic once she has completed treatment.   Ms. Stacey Navarro was encouraged to ask questions and all questions were answered to her satisfaction.  She was given my business card and encouraged to contact me with any concerns regarding survivorship.  I look forward to participating in her care.   Mike Craze, NP [place] [phone]

## 2014-10-31 NOTE — Progress Notes (Signed)
Subjective:     Patient ID: Stacey Navarro, female   DOB: [birth year], 74 y.o.   MRN: [id number]  HPI   Review of Systems     Objective:   Physical Exam For the patient to understand and be given the tools to implement a healthy plant based diet during their cancer diagnosis.     Assessment:     Patient was seen today and found to be surprised and accompanied by her indignant daughter. The pts daughter stated they eat organic and buy their groceries from whole foods so they do not need a dietitian. Pt states she went to the [facility] 3 days a week but stopped two weeks ago. Pts daughter stated she has told her mom all of this as she was typing on her laptop.      Plan:     Dietitian educated the patient on implementing a plant based diet by incorporating more plant proteins, fruits, and vegetables. As a part of a healthy routine physical activity was discussed. The importance of legitimate, evidence based information was discussed and examples were given..A folder of evidence based information with a focus on a plant based diet and general nutrition during cancer was given to the patient.  As a part of the continuum of care the cancer dietitian's contact information was given to the patient in the event they would like to have a follow up appointment.

## 2014-11-01 ENCOUNTER — Other Ambulatory Visit: Payer: Self-pay | Admitting: General Surgery

## 2014-11-01 DIAGNOSIS — C50211 Malignant neoplasm of upper-inner quadrant of right female breast: Secondary | ICD-10-CM

## 2014-11-06 ENCOUNTER — Telehealth: Payer: Self-pay | Admitting: *Deleted

## 2014-11-06 ENCOUNTER — Encounter (HOSPITAL_BASED_OUTPATIENT_CLINIC_OR_DEPARTMENT_OTHER): Payer: Self-pay | Admitting: *Deleted

## 2014-11-06 NOTE — Progress Notes (Signed)
Pt has no cardiac or resp problems-quit smoking 63yrago-dr ingram ordered cxr-called him to see if it can be waved-will call pt if she needs to go have done before seeds done 4/15

## 2014-11-06 NOTE — Telephone Encounter (Signed)
Spoke with patient from [facility] 10/31/14.  She is doing well. Confirmed follow up appointment with Dr. Lindi Adie for 4/28 at 815am. Encouraged her to call with any needs or concerns.

## 2014-11-07 NOTE — Progress Notes (Signed)
Dr Dalbert Batman said we could cancel cxr

## 2014-11-09 ENCOUNTER — Ambulatory Visit
Admission: RE | Admit: 2014-11-09 | Discharge: 2014-11-09 | Disposition: A | Discharge status: Home / Self Care | Payer: Medicare Other | Source: Ambulatory Visit | Attending: General Surgery | Admitting: General Surgery

## 2014-11-09 DIAGNOSIS — C50211 Malignant neoplasm of upper-inner quadrant of right female breast: Secondary | ICD-10-CM

## 2014-11-12 NOTE — H&P (Signed)
Stacey Navarro  Location: [facility] Surgery Patient #: [id number] DOB: April 06, 1941 Undefined / Language: Undefined / Race: Undefined Female        History of Present Illness  The patient is a 74 year old female who presents with breast cancer. This is a 74 year old Caucasian female, referred by Dr. Andres Shad of the breast center of [facility] for evaluation and management of a newly diagnosed invasive ductal carcinoma of the right breast, upper inner quadrant. Dr. [name] is her PCP. She is being evaluated in the [facility] today by Dr. Valere Dross, Dr. Lindi Adie, and me. She has not had any breast problems in the past. She's noticed a lump in her right breast upper inner quadrant for about 2 months. Last mammogram 10 years ago. Mammograms and ultrasound show a 3.1 cm mass in the right breast upper inner quadrant, 6 cm from the nipple. The axilla looks normal. Image data biopsy shows grade 2 invasive carcinoma, receptor positive, HER-2 negative. She is in the clinic today with her daughter. Family history reveals breast cancer in a maternal aunt but otherwise negative. Patient is a widow has 1 daughter who is here with her. Former smoker having quit 1997 Comorbidities are minimal. Slightly overweight. Status post cholecystectomy. GERD on Protonix. We've had a long talk about her options. We talked about lumpectomy, mastectomy, sentinel node biopsy, reconstruction. Her preference is breast conservation. I think she is a good candidate for a partial mastectomy. I discussed the indications, details, techniques, and numerous risk of right partial mastectomy with radioactive seed localization and right axillary sentinel node biopsy with her and her daughter. She is aware the risk of bleeding, infection, cosmetic deformity, reoperation for positive margins or positive nodes, arm swelling, arm numbness. At this time she understands all these issues and all of her questions are answered. She is in  favor of proceeding with this surgery. She knows that she will need to follow-up with radiation oncology and medical oncology postop.   Other Problems  Breast Cancer Gastroesophageal Reflux Disease Hemorrhoids Lump In Breast Pulmonary Embolism / Blood Clot in Legs  Past Surgical History  Breast Biopsy Right. Cataract Surgery Bilateral. Gallbladder Surgery - Laparoscopic Oral Surgery Tonsillectomy  Diagnostic Studies History  Colonoscopy never Mammogram within last year Pap Smear 1-5 years ago  Social History  Alcohol use Moderate alcohol use. Caffeine use Coffee. No drug use Tobacco use Former smoker.  Family History  Alcohol Abuse Father. Arthritis Family Members In General, Mother. Breast Cancer Family Members In General. Cancer Father. Diabetes Mellitus Family Members In General. Hypertension Brother, Family Members In General, Mother, Sister. Migraine Headache Daughter.  Pregnancy / Birth History  Age at menarche 18 years. Age of menopause 80-50 Contraceptive History Oral contraceptives. Gravida 1 Irregular periods Maternal age 7-25 Para 1  Review of Systems  General Present- Night Sweats and Weight Loss. Not Present- Appetite Loss, Chills, Fatigue, Fever and Weight Gain. Skin Not Present- Change in Wart/Mole, Dryness, Hives, Jaundice, New Lesions, Non-Healing Wounds, Rash and Ulcer. HEENT Present- Wears glasses/contact lenses. Not Present- Earache, Hearing Loss, Hoarseness, Nose Bleed, Oral Ulcers, Ringing in the Ears, Seasonal Allergies, Sinus Pain, Sore Throat, Visual Disturbances and Yellow Eyes. Respiratory Not Present- Bloody sputum, Chronic Cough, Difficulty Breathing, Snoring and Wheezing. Breast Present- Breast Mass. Not Present- Breast Pain, Nipple Discharge and Skin Changes. Cardiovascular Not Present- Chest Pain, Difficulty Breathing Lying Down, Leg Cramps, Palpitations, Rapid Heart Rate, Shortness of Breath and  Swelling of Extremities. Gastrointestinal Not Present- Abdominal Pain,  Bloating, Bloody Stool, Change in Bowel Habits, Chronic diarrhea, Constipation, Difficulty Swallowing, Excessive gas, Gets full quickly at meals, Hemorrhoids, Indigestion, Nausea, Rectal Pain and Vomiting. Female Genitourinary Not Present- Frequency, Nocturia, Painful Urination, Pelvic Pain and Urgency. Musculoskeletal Present- Joint Pain. Not Present- Back Pain, Joint Stiffness, Muscle Pain, Muscle Weakness and Swelling of Extremities. Neurological Not Present- Decreased Memory, Fainting, Headaches, Numbness, Seizures, Tingling, Tremor, Trouble walking and Weakness. Psychiatric Present- Anxiety. Not Present- Bipolar, Change in Sleep Pattern, Depression, Fearful and Frequent crying. Endocrine Not Present- Cold Intolerance, Excessive Hunger, Hair Changes, Heat Intolerance, Hot flashes and New Diabetes.   Physical Exam  General Mental Status-Alert. General Appearance-Consistent with stated age. Hydration-Well hydrated. Voice-Normal.  Head and Neck Head-normocephalic, atraumatic with no lesions or palpable masses. Trachea-midline. Thyroid Gland Characteristics - normal size and consistency.  Eye Eyeball - Bilateral-Extraocular movements intact. Sclera/Conjunctiva - Bilateral-No scleral icterus.  Chest and Lung Exam Chest and lung exam reveals -quiet, even and easy respiratory effort with no use of accessory muscles and on auscultation, normal breath sounds, no adventitious sounds and normal vocal resonance. Inspection Chest Wall - Normal. Back - normal.  Breast Breast - Left-Symmetric, Non Tender, No Dimpling, No Inflammation, No Lumpectomy scars, No Mastectomy scars, No Peau d' Orange. Breast - Right-Symmetric and Biopsy scar, Non Tender, No Dimpling, No Inflammation, No Lumpectomy scars, No Mastectomy scars, No Peau d' Orange. Note: Right breast reveals a 2.5 cm palpable mass in the upper  inner quadrant 11:00 position. Some ecchymoses present. Mobile. Little bit lumpy. No other skin change in either breast. No other masses. No axillary adenopathy on either side. Good range of motion both shoulders..   Cardiovascular Cardiovascular examination reveals -normal heart sounds, regular rate and rhythm with no murmurs and normal pedal pulses bilaterally.  Abdomen Inspection Inspection of the abdomen reveals - No Hernias. Skin - Scar - no surgical scars. Palpation/Percussion Palpation and Percussion of the abdomen reveal - Soft, Non Tender, No Rebound tenderness, No Rigidity (guarding) and No hepatosplenomegaly. Auscultation Auscultation of the abdomen reveals - Bowel sounds normal.  Neurologic Neurologic evaluation reveals -alert and oriented x 3 with no impairment of recent or remote memory. Mental Status-Normal.  Musculoskeletal Normal Exam - Left-Upper Extremity Strength Normal and Lower Extremity Strength Normal. Normal Exam - Right-Upper Extremity Strength Normal and Lower Extremity Strength Normal.  Lymphatic Head & Neck  General Head & Neck Lymphatics: Bilateral - Description - Normal. Axillary  General Axillary Region: Bilateral - Description - Normal. Tenderness - Non Tender. Femoral & Inguinal  Generalized Femoral & Inguinal Lymphatics: Bilateral - Description - Normal. Tenderness - Non Tender.    Assessment & Plan  PRIMARY CANCER OF UPPER INNER QUADRANT OF RIGHT FEMALE BREAST (174.2  C50.211) Current Plans  Schedule for Surgery Your recent mammograms and biopsy shows an invasive ductal carcinoma in the right breast, upper inner quadrant, approximately 3 cm in diameter. Hormone receptor positive and HER-2 negative. You are a good candidate for lumpectomy and sentinel node biopsy. We have discussed all of your surgical options, and breast conservation surgery is your preference. You will be scheduled for right partial mastectomy with  radioactive seed localization and right axillary sentinel node biopsy We have discussed the techniques and risk of the surgery in detail Dr. Darrel Hoover office will call you tomorrow to schedule the surgery.  FAMILY HISTORY OF BREAST CANCER IN FEMALE (V16.3  Z80.3) Impression: maternal aunt. Otherwise negative  CHRONIC GERD (530.81  K21.9)  FORMER SMOKER (V15.82  Z87.891)   Renelda Loma  Alyssa Grove, M.D., [facility] General and Minimally invasive Surgery Breast and Colorectal Surgery Office:   [phone] Pager:   [phone]

## 2014-11-13 ENCOUNTER — Ambulatory Visit (HOSPITAL_BASED_OUTPATIENT_CLINIC_OR_DEPARTMENT_OTHER): Payer: Medicare Other | Admitting: Anesthesiology

## 2014-11-13 ENCOUNTER — Ambulatory Visit (HOSPITAL_BASED_OUTPATIENT_CLINIC_OR_DEPARTMENT_OTHER)
Admission: RE | Admit: 2014-11-13 | Discharge: 2014-11-13 | Disposition: A | Discharge status: Home / Self Care | Payer: Medicare Other | Source: Ambulatory Visit | Attending: General Surgery | Admitting: General Surgery

## 2014-11-13 ENCOUNTER — Ambulatory Visit
Admission: RE | Admit: 2014-11-13 | Discharge: 2014-11-13 | Disposition: A | Discharge status: Home / Self Care | Payer: Medicare Other | Source: Ambulatory Visit | Attending: General Surgery | Admitting: General Surgery

## 2014-11-13 ENCOUNTER — Encounter (HOSPITAL_BASED_OUTPATIENT_CLINIC_OR_DEPARTMENT_OTHER)
Admission: RE | Disposition: A | Discharge status: Home / Self Care | Payer: Self-pay | Source: Ambulatory Visit | Attending: General Surgery

## 2014-11-13 ENCOUNTER — Ambulatory Visit (HOSPITAL_COMMUNITY)
Admission: RE | Admit: 2014-11-13 | Discharge: 2014-11-13 | Disposition: A | Discharge status: Home / Self Care | Payer: Medicare Other | Source: Ambulatory Visit | Attending: General Surgery | Admitting: General Surgery

## 2014-11-13 ENCOUNTER — Encounter (HOSPITAL_BASED_OUTPATIENT_CLINIC_OR_DEPARTMENT_OTHER): Payer: Self-pay | Admitting: Anesthesiology

## 2014-11-13 DIAGNOSIS — N6011 Diffuse cystic mastopathy of right breast: Secondary | ICD-10-CM | POA: Diagnosis not present

## 2014-11-13 DIAGNOSIS — Z803 Family history of malignant neoplasm of breast: Secondary | ICD-10-CM | POA: Diagnosis not present

## 2014-11-13 DIAGNOSIS — C50211 Malignant neoplasm of upper-inner quadrant of right female breast: Secondary | ICD-10-CM

## 2014-11-13 DIAGNOSIS — Z170 Estrogen receptor positive status [ER+]: Secondary | ICD-10-CM | POA: Insufficient documentation

## 2014-11-13 DIAGNOSIS — Z86711 Personal history of pulmonary embolism: Secondary | ICD-10-CM | POA: Diagnosis not present

## 2014-11-13 DIAGNOSIS — Z79899 Other long term (current) drug therapy: Secondary | ICD-10-CM | POA: Diagnosis not present

## 2014-11-13 DIAGNOSIS — R921 Mammographic calcification found on diagnostic imaging of breast: Secondary | ICD-10-CM | POA: Insufficient documentation

## 2014-11-13 DIAGNOSIS — K219 Gastro-esophageal reflux disease without esophagitis: Secondary | ICD-10-CM | POA: Diagnosis not present

## 2014-11-13 DIAGNOSIS — Z87891 Personal history of nicotine dependence: Secondary | ICD-10-CM | POA: Insufficient documentation

## 2014-11-13 DIAGNOSIS — Z01818 Encounter for other preprocedural examination: Secondary | ICD-10-CM

## 2014-11-13 HISTORY — PX: BREAST LUMPECTOMY: SHX2

## 2014-11-13 HISTORY — PX: RADIOACTIVE SEED GUIDED PARTIAL MASTECTOMY WITH AXILLARY SENTINEL LYMPH NODE BIOPSY: SHX6520

## 2014-11-13 SURGERY — RADIOACTIVE SEED GUIDED PARTIAL MASTECTOMY WITH AXILLARY SENTINEL LYMPH NODE BIOPSY
Anesthesia: General | Laterality: Right

## 2014-11-13 MED ORDER — BUPIVACAINE-EPINEPHRINE (PF) 0.5% -1:200000 IJ SOLN
INTRAMUSCULAR | Status: DC | PRN
  Administered 2014-11-13: 15 mL

## 2014-11-13 MED ORDER — DEXAMETHASONE SODIUM PHOSPHATE 4 MG/ML IJ SOLN
INTRAMUSCULAR | Status: DC | PRN
  Administered 2014-11-13: 10 mg via INTRAVENOUS

## 2014-11-13 MED ORDER — FENTANYL CITRATE 0.05 MG/ML IJ SOLN
50.0000 ug | INTRAMUSCULAR | Status: DC | PRN
  Administered 2014-11-13: 100 ug via INTRAVENOUS

## 2014-11-13 MED ORDER — HYDROMORPHONE HCL 1 MG/ML IJ SOLN
INTRAMUSCULAR | Status: AC
  Filled 2014-11-13: qty 1

## 2014-11-13 MED ORDER — HYDROMORPHONE HCL 1 MG/ML IJ SOLN
0.2500 mg | INTRAMUSCULAR | Status: DC | PRN
  Administered 2014-11-13: 0.5 mg via INTRAVENOUS
  Administered 2014-11-13: 0.25 mg via INTRAVENOUS

## 2014-11-13 MED ORDER — PROPOFOL 10 MG/ML IV BOLUS
INTRAVENOUS | Status: DC | PRN
  Administered 2014-11-13: 200 mg via INTRAVENOUS

## 2014-11-13 MED ORDER — FENTANYL CITRATE (PF) 100 MCG/2ML IJ SOLN
INTRAMUSCULAR | Status: DC | PRN
  Administered 2014-11-13 (×2): 25 ug via INTRAVENOUS
  Administered 2014-11-13: 100 ug via INTRAVENOUS
  Administered 2014-11-13: 50 ug via INTRAVENOUS

## 2014-11-13 MED ORDER — MEPERIDINE HCL 25 MG/ML IJ SOLN
6.2500 mg | INTRAMUSCULAR | Status: DC | PRN
Start: 2014-11-13 — End: 2014-11-13

## 2014-11-13 MED ORDER — TECHNETIUM TC 99M SULFUR COLLOID FILTERED: 1.0000 | Freq: Once | INTRAVENOUS | Status: AC | PRN

## 2014-11-13 MED ORDER — MIDAZOLAM HCL 2 MG/2ML IJ SOLN
1.0000 mg | INTRAMUSCULAR | Status: DC | PRN
  Administered 2014-11-13: 2 mg via INTRAVENOUS

## 2014-11-13 MED ORDER — MIDAZOLAM HCL 2 MG/2ML IJ SOLN
INTRAMUSCULAR | Status: AC
  Filled 2014-11-13: qty 2

## 2014-11-13 MED ORDER — CHLORHEXIDINE GLUCONATE 4 % EX LIQD: 1.0000 "application " | Freq: Once | CUTANEOUS | Status: DC

## 2014-11-13 MED ORDER — PHENYLEPHRINE HCL 10 MG/ML IJ SOLN
INTRAMUSCULAR | Status: DC | PRN
  Administered 2014-11-13 (×2): 40 ug via INTRAVENOUS

## 2014-11-13 MED ORDER — KETOROLAC TROMETHAMINE 30 MG/ML IJ SOLN: 30.0000 mg | Freq: Once | INTRAMUSCULAR | Status: DC | PRN

## 2014-11-13 MED ORDER — BUPIVACAINE-EPINEPHRINE (PF) 0.5% -1:200000 IJ SOLN
INTRAMUSCULAR | Status: DC | PRN
  Administered 2014-11-13: 25 mL via PERINEURAL

## 2014-11-13 MED ORDER — CEFAZOLIN SODIUM-DEXTROSE 2-3 GM-% IV SOLR: 2.0000 g | INTRAVENOUS | Status: DC

## 2014-11-13 MED ORDER — SODIUM CHLORIDE 0.9 % IJ SOLN
INTRAMUSCULAR | Status: DC | PRN
  Administered 2014-11-13: 5 mL

## 2014-11-13 MED ORDER — IBUPROFEN 100 MG/5ML PO SUSP
200.0000 mg | Freq: Four times a day (QID) | ORAL | Status: DC | PRN
  Administered 2014-11-13: 400 mg via ORAL

## 2014-11-13 MED ORDER — HYDROCODONE-ACETAMINOPHEN 5-325 MG PO TABS: 1.0000 | ORAL_TABLET | Freq: Four times a day (QID) | ORAL | Status: DC | PRN

## 2014-11-13 MED ORDER — CEFAZOLIN SODIUM-DEXTROSE 2-3 GM-% IV SOLR
INTRAVENOUS | Status: DC | PRN
  Administered 2014-11-13: 2 g via INTRAVENOUS

## 2014-11-13 MED ORDER — CEFAZOLIN SODIUM-DEXTROSE 2-3 GM-% IV SOLR
INTRAVENOUS | Status: AC
  Filled 2014-11-13: qty 50

## 2014-11-13 MED ORDER — ONDANSETRON HCL 4 MG/2ML IJ SOLN
INTRAMUSCULAR | Status: DC | PRN
  Administered 2014-11-13: 4 mg via INTRAVENOUS

## 2014-11-13 MED ORDER — SODIUM CHLORIDE 0.9 % IJ SOLN
INTRAMUSCULAR | Status: AC
  Filled 2014-11-13: qty 10

## 2014-11-13 MED ORDER — FENTANYL CITRATE (PF) 100 MCG/2ML IJ SOLN
INTRAMUSCULAR | Status: AC
  Filled 2014-11-13: qty 2

## 2014-11-13 MED ORDER — FENTANYL CITRATE (PF) 100 MCG/2ML IJ SOLN
INTRAMUSCULAR | Status: AC
  Filled 2014-11-13: qty 6

## 2014-11-13 MED ORDER — IBUPROFEN 100 MG/5ML PO SUSP
ORAL | Status: AC
  Filled 2014-11-13: qty 20

## 2014-11-13 MED ORDER — LIDOCAINE HCL (CARDIAC) 20 MG/ML IV SOLN
INTRAVENOUS | Status: DC | PRN
  Administered 2014-11-13: 50 mg via INTRAVENOUS

## 2014-11-13 MED ORDER — METHYLENE BLUE 1 % INJ SOLN
INTRAMUSCULAR | Status: AC
  Filled 2014-11-13: qty 10

## 2014-11-13 MED ORDER — IBUPROFEN 200 MG PO TABS: 200.0000 mg | ORAL_TABLET | Freq: Four times a day (QID) | ORAL | Status: DC | PRN

## 2014-11-13 MED ORDER — LACTATED RINGERS IV SOLN
INTRAVENOUS | Status: DC
  Administered 2014-11-13 (×2): via INTRAVENOUS

## 2014-11-13 SURGICAL SUPPLY — 66 items
ADH SKN CLS APL DERMABOND .7 (GAUZE/BANDAGES/DRESSINGS) ×1
APL SKNCLS STERI-STRIP NONHPOA (GAUZE/BANDAGES/DRESSINGS)
APPLIER CLIP 9.375 MED OPEN (MISCELLANEOUS) ×2
APR CLP MED 9.3 20 MLT OPN (MISCELLANEOUS) ×1
BENZOIN TINCTURE PRP APPL 2/3 (GAUZE/BANDAGES/DRESSINGS) IMPLANT
BINDER BREAST LRG (GAUZE/BANDAGES/DRESSINGS) IMPLANT
BINDER BREAST MEDIUM (GAUZE/BANDAGES/DRESSINGS) IMPLANT
BINDER BREAST XLRG (GAUZE/BANDAGES/DRESSINGS) ×1 IMPLANT
BINDER BREAST XXLRG (GAUZE/BANDAGES/DRESSINGS) IMPLANT
BLADE HEX COATED 2.75 (ELECTRODE) ×2 IMPLANT
BLADE SURG 10 STRL SS (BLADE) IMPLANT
BLADE SURG 15 STRL LF DISP TIS (BLADE) ×1 IMPLANT
BLADE SURG 15 STRL SS (BLADE) ×4
CANISTER SUC SOCK COL 7IN (MISCELLANEOUS) IMPLANT
CANISTER SUCT 1200ML W/VALVE (MISCELLANEOUS) ×2 IMPLANT
CHLORAPREP W/TINT 26ML (MISCELLANEOUS) ×2 IMPLANT
CLIP APPLIE 9.375 MED OPEN (MISCELLANEOUS) ×1 IMPLANT
COVER BACK TABLE 60X90IN (DRAPES) ×2 IMPLANT
COVER MAYO STAND STRL (DRAPES) ×2 IMPLANT
COVER PROBE W GEL 5X96 (DRAPES) ×2 IMPLANT
DECANTER SPIKE VIAL GLASS SM (MISCELLANEOUS) IMPLANT
DERMABOND ADVANCED (GAUZE/BANDAGES/DRESSINGS) ×1
DERMABOND ADVANCED .7 DNX12 (GAUZE/BANDAGES/DRESSINGS) ×1 IMPLANT
DEVICE DUBIN W/COMP PLATE 8390 (MISCELLANEOUS) ×2 IMPLANT
DRAPE LAPAROSCOPIC ABDOMINAL (DRAPES) ×2 IMPLANT
DRAPE UTILITY XL STRL (DRAPES) ×2 IMPLANT
DRSG PAD ABDOMINAL 8X10 ST (GAUZE/BANDAGES/DRESSINGS) ×1 IMPLANT
ELECT REM PT RETURN 9FT ADLT (ELECTROSURGICAL) ×2
ELECTRODE REM PT RTRN 9FT ADLT (ELECTROSURGICAL) ×1 IMPLANT
GAUZE SPONGE 4X4 12PLY STRL (GAUZE/BANDAGES/DRESSINGS) IMPLANT
GLOVE BIOGEL PI IND STRL 7.0 (GLOVE) IMPLANT
GLOVE BIOGEL PI IND STRL 7.5 (GLOVE) IMPLANT
GLOVE BIOGEL PI INDICATOR 7.0 (GLOVE) ×1
GLOVE BIOGEL PI INDICATOR 7.5 (GLOVE) ×1
GLOVE ECLIPSE 6.5 STRL STRAW (GLOVE) ×2 IMPLANT
GLOVE EUDERMIC 7 POWDERFREE (GLOVE) ×4 IMPLANT
GLOVE SURG SS PI 7.0 STRL IVOR (GLOVE) ×1 IMPLANT
GOWN STRL REUS W/ TWL LRG LVL3 (GOWN DISPOSABLE) ×2 IMPLANT
GOWN STRL REUS W/ TWL XL LVL3 (GOWN DISPOSABLE) ×1 IMPLANT
GOWN STRL REUS W/TWL LRG LVL3 (GOWN DISPOSABLE) ×4
GOWN STRL REUS W/TWL XL LVL3 (GOWN DISPOSABLE) ×4
KIT MARKER MARGIN INK (KITS) ×2 IMPLANT
NDL HYPO 25X1 1.5 SAFETY (NEEDLE) ×2 IMPLANT
NDL SAFETY ECLIPSE 18X1.5 (NEEDLE) ×1 IMPLANT
NEEDLE HYPO 18GX1.5 SHARP (NEEDLE) ×2
NEEDLE HYPO 25X1 1.5 SAFETY (NEEDLE) ×4 IMPLANT
NS IRRIG 1000ML POUR BTL (IV SOLUTION) ×3 IMPLANT
PACK BASIN DAY SURGERY FS (CUSTOM PROCEDURE TRAY) ×2 IMPLANT
PENCIL BUTTON HOLSTER BLD 10FT (ELECTRODE) ×2 IMPLANT
SHEET MEDIUM DRAPE 40X70 STRL (DRAPES) ×1 IMPLANT
SLEEVE SCD COMPRESS KNEE MED (MISCELLANEOUS) ×2 IMPLANT
SPONGE LAP 18X18 X RAY DECT (DISPOSABLE) ×1 IMPLANT
SPONGE LAP 4X18 X RAY DECT (DISPOSABLE) ×1 IMPLANT
STRIP CLOSURE SKIN 1/2X4 (GAUZE/BANDAGES/DRESSINGS) IMPLANT
SUT MNCRL AB 4-0 PS2 18 (SUTURE) ×3 IMPLANT
SUT SILK 2 0 SH (SUTURE) ×2 IMPLANT
SUT VIC AB 2-0 CT1 27 (SUTURE)
SUT VIC AB 2-0 CT1 TAPERPNT 27 (SUTURE) IMPLANT
SUT VIC AB 3-0 SH 27 (SUTURE)
SUT VIC AB 3-0 SH 27X BRD (SUTURE) IMPLANT
SUT VICRYL 3-0 CR8 SH (SUTURE) ×3 IMPLANT
SYRINGE 10CC LL (SYRINGE) ×4 IMPLANT
TOWEL OR 17X24 6PK STRL BLUE (TOWEL DISPOSABLE) ×2 IMPLANT
TOWEL OR NON WOVEN STRL DISP B (DISPOSABLE) ×2 IMPLANT
TUBE CONNECTING 20X1/4 (TUBING) ×2 IMPLANT
YANKAUER SUCT BULB TIP NO VENT (SUCTIONS) ×2 IMPLANT

## 2014-11-13 NOTE — Transfer of Care (Signed)
Immediate Anesthesia Transfer of Care Note  Patient: Stacey Navarro  Procedure(s) Performed: Procedure(s): RIGHT PARTIAL MASTECTOMY WITH RADIOACTIVE SEED LOCALIZATION RIGHT  AXILLARY SENTINEL LYMPH NODE BIOPSY (Right)  Patient Location: PACU  Anesthesia Type:GA combined with regional for post-op pain  Level of Consciousness: awake, alert  and oriented  Airway & Oxygen Therapy: Patient Spontanous Breathing and Patient connected to face mask oxygen  Post-op Assessment: Report given to RN and Post -op Vital signs reviewed and stable  Post vital signs: Reviewed and stable  Last Vitals:  Filed Vitals:   11/13/14 1121  BP:   Pulse: 74  Temp:   Resp: 20    Complications: No apparent anesthesia complications

## 2014-11-13 NOTE — Discharge Instructions (Signed)
[facility] °Office Phone Number [phone] ° °BREAST BIOPSY/ PARTIAL MASTECTOMY: POST OP INSTRUCTIONS ° °Always review your discharge instruction sheet given to you by the facility where your surgery was performed. ° °IF YOU HAVE DISABILITY OR FAMILY LEAVE FORMS, YOU MUST BRING THEM TO THE OFFICE FOR PROCESSING.  DO NOT GIVE THEM TO YOUR DOCTOR. ° °1. A prescription for pain medication may be given to you upon discharge.  Take your pain medication as prescribed, if needed.  If narcotic pain medicine is not needed, then you may take acetaminophen (Tylenol) or ibuprofen (Advil) as needed. °2. Take your usually prescribed medications unless otherwise directed °3. If you need a refill on your pain medication, please contact your pharmacy.  They will contact our office to request authorization.  Prescriptions will not be filled after 5pm or on week-ends. °4. You should eat very light the first 24 hours after surgery, such as soup, crackers, pudding, etc.  Resume your normal diet the day after surgery. °5. Most patients will experience some swelling and bruising in the breast.  Ice packs and a good support bra will help.  Swelling and bruising can take several days to resolve.  °6. It is common to experience some constipation if taking pain medication after surgery.  Increasing fluid intake and taking a stool softener will usually help or prevent this problem from occurring.  A mild laxative (Milk of Magnesia or Miralax) should be taken according to package directions if there are no bowel movements after 48 hours. °7. Unless discharge instructions indicate otherwise, you may remove your bandages 24-48 hours after surgery, and you may shower at that time.  You may have steri-strips (small skin tapes) in place directly over the incision.  These strips should be left on the skin for 7-10 days.  If your surgeon used skin glue on the incision, you may shower in 24 hours.  The glue will flake off over the  next 2-3 weeks.  Any sutures or staples will be removed at the office during your follow-up visit. °8. ACTIVITIES:  You may resume regular daily activities (gradually increasing) beginning the next day.  Wearing a good support bra or sports bra minimizes pain and swelling.  You may have sexual intercourse when it is comfortable. °a. You may drive when you no longer are taking prescription pain medication, you can comfortably wear a seatbelt, and you can safely maneuver your car and apply brakes. °b. RETURN TO WORK:  ______________________________________________________________________________________ °9. You should see your doctor in the office for a follow-up appointment approximately two weeks after your surgery.  Your doctor’s nurse will typically make your follow-up appointment when she calls you with your pathology report.  Expect your pathology report 2-3 business days after your surgery.  You may call to check if you do not hear from us after three days. °10. OTHER INSTRUCTIONS: _______________________________________________________________________________________________ _____________________________________________________________________________________________________________________________________ °_____________________________________________________________________________________________________________________________________ °_____________________________________________________________________________________________________________________________________ ° °WHEN TO CALL YOUR DOCTOR: °1. Fever over 101.0 °2. Nausea and/or vomiting. °3. Extreme swelling or bruising. °4. Continued bleeding from incision. °5. Increased pain, redness, or drainage from the incision. ° °The clinic staff is available to answer your questions during regular business hours.  Please don’t hesitate to call and ask to speak to one of the nurses for clinical concerns.  If you have a medical emergency, go to the nearest  emergency room or call 911.  A surgeon from [facility] is always on call at the hospital. ° °For further questions, please visit [email or web address]  ° ° ° °  Post Anesthesia Home Care Instructions ° °Activity: °Get plenty of rest for the remainder of the day. A responsible adult should stay with you for 24 hours following the procedure.  °For the next 24 hours, DO NOT: °-Drive a car °-Operate machinery °-Drink alcoholic beverages °-Take any medication unless instructed by your physician °-Make any legal decisions or sign important papers. ° °Meals: °Start with liquid foods such as gelatin or soup. Progress to regular foods as tolerated. Avoid greasy, spicy, heavy foods. If nausea and/or vomiting occur, drink only clear liquids until the nausea and/or vomiting subsides. Call your physician if vomiting continues. ° °Special Instructions/Symptoms: °Your throat may feel dry or sore from the anesthesia or the breathing tube placed in your throat during surgery. If this causes discomfort, gargle with warm salt water. The discomfort should disappear within 24 hours. ° °If you had a scopolamine patch placed behind your ear for the management of post- operative nausea and/or vomiting: ° °1. The medication in the patch is effective for 72 hours, after which it should be removed.  Wrap patch in a tissue and discard in the trash. Wash hands thoroughly with soap and water. °2. You may remove the patch earlier than 72 hours if you experience unpleasant side effects which may include dry mouth, dizziness or visual disturbances. °3. Avoid touching the patch. Wash your hands with soap and water after contact with the patch. °  ° °

## 2014-11-13 NOTE — Op Note (Signed)
Patient Name:           Stacey Navarro   Date of Surgery:        11/13/2014  Pre op Diagnosis:      Invasive ductal carcinoma right breast, upper inner quadrant, receptor positive, HER-2 negative. Clinical stage T2, N0.  Post op Diagnosis:    Same  Procedure:                 Inject blue dye right breast                                                            Right partial mastectomy with radioactive seed localization                                      Tissue transfer, 10 cm transversely, 8 cm vertically, 6 cm deep                                      Right axillary sentinel lymph node biopsy    Surgeon:                     Edsel Petrin. Dalbert Batman, M.D., FACS  Assistant:                      OR staff   Operative Indications:    This is a 74 year old Caucasian female, referred by Dr. Andres Shad of the breast center of [facility] for evaluation and management of a newly diagnosed invasive ductal carcinoma of the right breast, upper inner quadrant. Dr. [name] is her PCP. She was  evaluated in the [facility]  by Dr. Valere Dross, Dr. Lindi Adie, and me. She has not had any breast problems in the past. She's noticed a lump in her right breast upper inner quadrant for about 2 months. Last mammogram 10 years ago. Mammograms and ultrasound show a 3.1 cm mass in the right breast upper inner quadrant, 6 cm from the nipple. The axilla looks normal. Image guided biopsy shows grade 2 invasive carcinoma, receptor positive, HER-2 negative. . Family history reveals breast cancer in a maternal aunt but otherwise negative. Patient is a widow has 1 daughter.Former smoker having quit 1997 Comorbidities are minimal. Slightly overweight. Status post cholecystectomy. GERD on Protonix. We've had a long talk about her options. We talked about lumpectomy, mastectomy, sentinel node biopsy, reconstruction. Her preference is breast conservation. I think she is a good candidate for a partial mastectomy. . She is in favor of  proceeding with this surgery. She knows that she will need to follow-up with radiation oncology and medical oncology postop.  Operative Findings:       Because the tumor was relatively large, I did a very generous lumpectomy. The cavity was 10 cm transversely by 8 cm vertically by 6 cm deep. I had to undermine and transfer tissue from superior and inferior to close the wound without significant defect. The specimen mammogram looked good with the marker clip, radioactive seed and the malignant density essentially in the center of the specimen. In the right axilla I found 2  sentinel lymph nodes that were hot and blue and one non-sentinel lymph node was also sent.   Procedure in Detail:          The patient underwent radioactive seed localization at the breast center of [facility] a few days ago. She was brought to the holding area at [facility] day surgery center. Using the neoprobe I identified the radioactivity in the right breast, upper inner quadrant. The nuclear medicine technician injected the technetium radionuclide into the right breast. A pectoral block was performed by Dr. Al Corpus. Patient was taken to the operating room and underwent general anesthesia with LMA device. Surgical timeout was performed. Intravenous antibiotics were given. Following alcohol prep I injected 5 mL of blue dye into the right breast, subareolar area. I massaged the breast for a few minutes.    The right breast and axilla were then prepped and draped in a sterile fashion. 0.5% Marcaine with epinephrine was used as local infiltration anesthetic. Using the neoprobe I found the area of greatest I-125 radioactivity in the right breast at about the 11:00 position. I marked a curvilinear, circumareolar incision at the site. I made the incision. I dissected down into the breast tissue widely around the mass and the radioactivity interlock took the dissection all the way to the pectoral fascia. The posterior margin is therefore the pectoral  fascia. I used the neoprobe frequently during the dissection to guide my dissection. The specimen was marked with 6 color ink  and silk sutures to orient the pathologist. Specimen mammogram looked good and the specimen was sent to the lab. There is no radioactivity remaining in the breast.   This left a significant defect as described above. I marked the lumpectomy cavity with metal clips. I extensively undermined the breast tissue superiorly and inferiorly to transfer the tissue back together. I used multiple 3-0 Vicryl sutures to then slide the breast tissue back together in 3 layers and I then closed the skin with a running subcuticular 4-0 Monocryl and Dermabond.        Using the neoprobe on the technetium 99 setting I chose to make right axillary incision at the hairline. Using the neoprobe I dissected down into the axilla. I found 2 obvious sentinel lymph nodes and then a third non-sentinel node came out with the specimen and that was sent separately as well. After these nodes were removed there was essentially no radioactivity left within the axilla. Hemostasis was excellent and achieved electrocautery. We was irrigated with saline. The deeper axilla tissues were closed with 3-0 Vicryl sutures and skin closed with running subcuticular 4-0 Monocryl and Dermabond. Dry bandages and a breast binder were placed. The patient tolerated the procedure well was taken to PACU in stable condition. EBL 20 mL. Counts correct. Complications none.      Edsel Petrin. Dalbert Batman, M.D., FACS General and Minimally Invasive Surgery Breast and Colorectal Surgery  11/13/2014 12:50 PM

## 2014-11-13 NOTE — Progress Notes (Signed)
Assisted Dr. Al Corpus with right, ultrasound guided, pectoralis block. Side rails up, monitors on throughout procedure. See vital signs in flow sheet. Tolerated Procedure well.

## 2014-11-13 NOTE — Anesthesia Preprocedure Evaluation (Addendum)
Anesthesia Evaluation  Patient identified by MRN, date of birth, ID band Patient awake    Reviewed: Allergy & Precautions, NPO status , Patient's Chart, lab work & pertinent test results  Airway Mallampati: I  TM Distance: >3 FB Neck ROM: Full    Dental  (+) Teeth Intact, Dental Advisory Given   Pulmonary former smoker,  breath sounds clear to auscultation        Cardiovascular Rhythm:Regular Rate:Normal     Neuro/Psych    GI/Hepatic GERD-  Medicated and Controlled,  Endo/Other    Renal/GU      Musculoskeletal   Abdominal   Peds  Hematology   Anesthesia Other Findings   Reproductive/Obstetrics                            Anesthesia Physical Anesthesia Plan  ASA: I  Anesthesia Plan: General   Post-op Pain Management:    Induction: Intravenous  Airway Management Planned: LMA  Additional Equipment:   Intra-op Plan:   Post-operative Plan: Extubation in OR  Informed Consent: I have reviewed the patients History and Physical, chart, labs and discussed the procedure including the risks, benefits and alternatives for the proposed anesthesia with the patient or authorized representative who has indicated his/her understanding and acceptance.   Dental advisory given  Plan Discussed with: CRNA, Anesthesiologist and Surgeon  Anesthesia Plan Comments:         Anesthesia Quick Evaluation

## 2014-11-13 NOTE — Anesthesia Postprocedure Evaluation (Signed)
  Anesthesia Post-op Note  Patient: Stacey Navarro  Procedure(s) Performed: Procedure(s): RIGHT PARTIAL MASTECTOMY WITH RADIOACTIVE SEED LOCALIZATION RIGHT  AXILLARY SENTINEL LYMPH NODE BIOPSY (Right)  Patient Location: PACU  Anesthesia Type: General   Level of Consciousness: awake, alert  and oriented  Airway and Oxygen Therapy: Patient Spontanous Breathing  Post-op Pain: mild  Post-op Assessment: Post-op Vital signs reviewed  Post-op Vital Signs: Reviewed  Last Vitals:  Filed Vitals:   11/13/14 1345  BP: 134/71  Pulse:   Temp:   Resp:     Complications: No apparent anesthesia complications

## 2014-11-13 NOTE — Interval H&P Note (Signed)
History and Physical Interval Note:  11/13/2014 11:25 AM  Stacey Navarro  has presented today for surgery, with the diagnosis of invasive cancer right breast  The various methods of treatment have been discussed with the patient and family. After consideration of risks, benefits and other options for treatment, the patient has consented to  Procedure(s): RIGHT PARTIAL MASTECTOMY WITH RADIOACTIVE SEED LOCALIZATION RIGHT  AXILLARY SENTINEL LYMPH NODE BIOPSY (Right) as a surgical intervention .  The patient's history has been reviewed, patient examined today, no change in status, stable for surgery.  I have reviewed the patient's chart and labs.  Questions were answered to the patient's satisfaction.     [name]

## 2014-11-13 NOTE — Anesthesia Procedure Notes (Addendum)
Procedure Name: LMA Insertion Date/Time: 11/13/2014 11:43 AM Performed by: Toula Moos L Pre-anesthesia Checklist: Patient identified, Emergency Drugs available, Suction available, Patient being monitored and Timeout performed Patient Re-evaluated:Patient Re-evaluated prior to inductionOxygen Delivery Method: Circle System Utilized Preoxygenation: Pre-oxygenation with 100% oxygen Intubation Type: IV induction Ventilation: Mask ventilation without difficulty LMA: LMA inserted LMA Size: 4.0 Number of attempts: 1 Airway Equipment and Method: Bite block Placement Confirmation: positive ETCO2 Tube secured with: Tape Dental Injury: Teeth and Oropharynx as per pre-operative assessment    Anesthesia Regional Block:  Pectoralis block  Pre-Anesthetic Checklist: ,, timeout performed, Correct Patient, Correct Site, Correct Laterality, Correct Procedure, Correct Position, site marked, Risks and benefits discussed,  Surgical consent,  Pre-op evaluation,  At surgeon's request and post-op pain management  Laterality: Right and Upper  Prep: chloraprep       Needles:  Injection technique: Single-shot  Needle Type: Echogenic Needle     Needle Length: 9cm 9 cm Needle Gauge: 21 and 21 G    Additional Needles:  Procedures: ultrasound guided (picture in chart) Pectoralis block Narrative:  Start time: 11/13/2014 11:20 AM End time: 11/13/2014 11:26 AM Injection made incrementally with aspirations every 5 mL.  Performed by: Personally  Anesthesiologist: Dimetrius Montfort

## 2014-11-14 ENCOUNTER — Encounter (HOSPITAL_BASED_OUTPATIENT_CLINIC_OR_DEPARTMENT_OTHER): Payer: Self-pay | Admitting: General Surgery

## 2014-11-14 NOTE — Addendum Note (Signed)
Addendum  created 11/14/14 9641 by Tawni Millers, CRNA   Modules edited: Charges VN

## 2014-11-16 NOTE — Progress Notes (Signed)
Quick Note:  Inform patient of Pathology report,. Tell her that the tumor was completely removed with negative margins and the lymph nodes are negative. This is good news. She will not need any further surgery. Make sure she has a follow-up appointment with me. Make sure she has appointments to follow-up with Dr. [name] in radiation oncology and Dr. Sonny Dandy in medical oncology. Tell her I'll discuss the pathology report in more detail at her next visit.  hmi ______

## 2014-11-21 NOTE — Assessment & Plan Note (Signed)
Rt Lumpectomy 11/14/14: IDC Grade 3, 2.7 cm size, 0/3 LN , Margins clear; T2N0 Stage IIA Right breast invasive ductal carcinoma diagnosed with screening mammogram, 3.1 cm by ultrasound in the upper quadrant 6 cm from the nipple, grade 2-3, EF 100%, PR 48%, HER-2 negative ratio 0.6, Ki-67 was 28%  Pathology Counseling: Discussed the final path report and provided her with a copy of it.  Recommendation:  1. ONCOTYPE Dx testing to see if chemo would be of benefit 2. Adjuvant XRT 3. Adjuvant anti-estrogen therapy  RTC based on Oncotype results

## 2014-11-22 ENCOUNTER — Telehealth: Payer: Self-pay | Admitting: *Deleted

## 2014-11-22 ENCOUNTER — Ambulatory Visit (INDEPENDENT_AMBULATORY_CARE_PROVIDER_SITE_OTHER): Payer: Medicare Other | Admitting: Internal Medicine

## 2014-11-22 ENCOUNTER — Ambulatory Visit (HOSPITAL_BASED_OUTPATIENT_CLINIC_OR_DEPARTMENT_OTHER): Payer: Medicare Other | Admitting: Hematology and Oncology

## 2014-11-22 VITALS — BP 126/68 | HR 74 | Temp 98.3°F | Resp 16 | Ht 66.0 in | Wt 209.0 lb

## 2014-11-22 VITALS — BP 92/64 | HR 81 | Temp 97.4°F | Resp 18 | Ht 66.0 in | Wt 209.2 lb

## 2014-11-22 DIAGNOSIS — C50211 Malignant neoplasm of upper-inner quadrant of right female breast: Secondary | ICD-10-CM

## 2014-11-22 DIAGNOSIS — Z170 Estrogen receptor positive status [ER+]: Secondary | ICD-10-CM

## 2014-11-22 DIAGNOSIS — B029 Zoster without complications: Secondary | ICD-10-CM | POA: Diagnosis not present

## 2014-11-22 MED ORDER — VALACYCLOVIR HCL 1 G PO TABS: 1000.0000 mg | ORAL_TABLET | Freq: Two times a day (BID) | ORAL | Status: DC

## 2014-11-22 NOTE — Patient Instructions (Signed)

## 2014-11-22 NOTE — Telephone Encounter (Signed)
Received order for oncotype testing from Dr. Lindi Adie. Requisition sent to pathology. Received by Alyse Low. PAC sent to [facility].

## 2014-11-22 NOTE — Progress Notes (Signed)
Pre visit review using our clinic review tool, if applicable. No additional management support is needed unless otherwise documented below in the visit note. 

## 2014-11-22 NOTE — Progress Notes (Signed)
Patient Care Team: [name], MD as PCP - General (Internal Medicine) [name], MD as Consulting Physician (General Surgery) [name], MD as Consulting Physician (Hematology and Oncology) [name], MD as Consulting Physician (Radiation Oncology) Mauro Kaufmann, RN as Registered Nurse Rockwell Germany, RN as Registered Nurse Holley Bouche, NP as Nurse Practitioner (Nurse Practitioner)  DIAGNOSIS: Breast cancer of upper-inner quadrant of right female breast   Staging form: Breast, AJCC 7th Edition     Clinical stage from 10/31/2014: Stage IIA (T2, N0, M0) - Unsigned   SUMMARY OF ONCOLOGIC HISTORY:   Breast cancer of upper-inner quadrant of right female breast   10/19/2014 Mammogram Right breast mass 3.1 cm upper quadrant 6 cm from the nipple   10/19/2014 Initial Diagnosis Right breast invasive ductal carcinoma grade 2-3, ER 100%, PR 48%, HER-2 negative ratio 0.6, Ki-67 28%   11/13/2014 Surgery Rt Lumpectomy: IDC Grade 3, 2.7 cm size, 0/3 LN , Margins clear; T2N0 Stage IIA    CHIEF COMPLIANT: follow-up after lumpectomy  INTERVAL HISTORY: Stacey Navarro is a 74 year old lady with above-mentioned history of right-sided breast cancer treated with lumpectomy and is currently here to discuss pathology report. She reports that she is doing very well from surgery standpoint with very minimal discomfort underneath the arm.  REVIEW OF SYSTEMS:   Constitutional: Denies fevers, chills or abnormal weight loss Eyes: Denies blurriness of vision Ears, nose, mouth, throat, and face: Denies mucositis or sore throat Respiratory: Denies cough, dyspnea or wheezes Cardiovascular: Denies palpitation, chest discomfort or lower extremity swelling Gastrointestinal:  Denies nausea, heartburn or change in bowel habits Skin: Denies abnormal skin rashes Lymphatics: Denies new lymphadenopathy or easy bruising Neurological:Denies numbness, tingling or new weaknesses Behavioral/Psych: Mood is  stable, no new changes  Breast: mild soreness underneath the arm from recent surgery All other systems were reviewed with the patient and are negative.  I have reviewed the past medical history, past surgical history, social history and family history with the patient and they are unchanged from previous note.  ALLERGIES:  has No Known Allergies.  MEDICATIONS:  Current Outpatient Prescriptions  Medication Sig Dispense Refill  . benzonatate (TESSALON) 100 MG capsule   0  . HYDROcodone-acetaminophen (NORCO) 5-325 MG per tablet Take 1-2 tablets by mouth every 6 (six) hours as needed for moderate pain or severe pain. 30 tablet 0  . pantoprazole (PROTONIX) 40 MG tablet TAKE 1 TABLET (40 MG TOTAL) BY MOUTH DAILY. 30 tablet 11   No current facility-administered medications for this visit.    PHYSICAL EXAMINATION: ECOG PERFORMANCE STATUS: 1 - Symptomatic but completely ambulatory  Filed Vitals:   11/22/14 0836  BP: 92/64  Pulse: 81  Temp: 97.4 F (36.3 C)  Resp: 18   Filed Weights   11/22/14 0836  Weight: 209 lb 3.2 oz (94.892 kg)    GENERAL:alert, no distress and comfortable SKIN: skin color, texture, turgor are normal, no rashes or significant lesions EYES: normal, Conjunctiva are pink and non-injected, sclera clear OROPHARYNX:no exudate, no erythema and lips, buccal mucosa, and tongue normal  NECK: supple, thyroid normal size, non-tender, without nodularity LYMPH:  no palpable lymphadenopathy in the cervical, axillary or inguinal LUNGS: clear to auscultation and percussion with normal breathing effort HEART: regular rate & rhythm and no murmurs and no lower extremity edema ABDOMEN:abdomen soft, non-tender and normal bowel sounds Musculoskeletal:no cyanosis of digits and no clubbing  NEURO: alert & oriented x 3 with fluent speech, no focal motor/sensory deficits  LABORATORY DATA:  I have reviewed the data as listed   Chemistry      Component Value Date/Time   NA 142  10/31/2014 0818   NA 138 10/03/2014 1055   K 4.6 10/31/2014 0818   K 4.1 10/03/2014 1055   CL 103 10/03/2014 1055   CO2 24 10/31/2014 0818   CO2 27 10/03/2014 1055   BUN 18.9 10/31/2014 0818   BUN 17 10/03/2014 1055   CREATININE 1.0 10/31/2014 0818   CREATININE 0.92 10/03/2014 1055      Component Value Date/Time   CALCIUM 9.4 10/31/2014 0818   CALCIUM 9.9 10/03/2014 1055   ALKPHOS 112 10/31/2014 0818   ALKPHOS 108 10/03/2014 1055   AST 33 10/31/2014 0818   AST 33 10/03/2014 1055   ALT 48 10/31/2014 0818   ALT 48* 10/03/2014 1055   BILITOT 0.41 10/31/2014 0818   BILITOT 0.7 10/03/2014 1055       Lab Results  Component Value Date   WBC 7.3 10/31/2014   HGB 15.0 10/31/2014   HCT 45.7 10/31/2014   MCV 94.4 10/31/2014   PLT 245 10/31/2014   NEUTROABS 4.2 10/31/2014    ASSESSMENT & PLAN:  Breast cancer of upper-inner quadrant of right female breast Rt Lumpectomy 11/14/14: IDC Grade 3, 2.7 cm size, 0/3 LN , Margins clear; T2N0 Stage IIA Right breast invasive ductal carcinoma diagnosed with screening mammogram, 3.1 cm by ultrasound in the upper quadrant 6 cm from the nipple, grade 2-3, EF 100%, PR 48%, HER-2 negative ratio 0.6, Ki-67 was 28%  Pathology Counseling: Discussed the final path report and provided her with a copy of it.  Recommendation:  1. ONCOTYPE Dx testing to see if chemo would be of benefit 2. Adjuvant XRT 3. Adjuvant anti-estrogen therapy  RTC based on Oncotype results  patient is an appointment to see radiation oncology next week. If she does not need chemotherapy she can proceed with radiation therapy and I will see her after radiation is complete. I discussed with her the risks and benefits of antiestrogen therapy with anastrozole.  Anastrozole counseling: We discussed the risks and benefits of anti-estrogen therapy with aromatase inhibitors. These include but not limited to insomnia, hot flashes, mood changes, vaginal dryness, bone density loss, and  weight gain. Although rare, serious side effects including endometrial cancer, risk of blood clots were also discussed. We strongly believe that the benefits far outweigh the risks. Patient understands these risks and consented to starting treatment. Planned treatment duration is 5 years.   No orders of the defined types were placed in this encounter.   The patient has a good understanding of the overall plan. she agrees with it. She will call with any problems that may develop before her next visit here.   Rulon Eisenmenger, MD

## 2014-11-25 ENCOUNTER — Encounter: Payer: Self-pay | Admitting: Internal Medicine

## 2014-11-25 NOTE — Progress Notes (Signed)
   Subjective:    Patient ID: Stacey Navarro, female    DOB: 1940-08-11, 74 y.o.   MRN: [id number]  Rash This is a new problem. The current episode started in the past 7 days. The problem is unchanged. The affected locations include the left arm. The rash is characterized by redness and blistering. She was exposed to nothing. Pertinent negatives include no anorexia, congestion, cough, diarrhea, eye pain, facial edema, fatigue, fever, joint pain, nail changes, rhinorrhea, shortness of breath, sore throat or vomiting. Past treatments include nothing. The treatment provided no relief.      Review of Systems  Constitutional: Negative.  Negative for fever, chills, diaphoresis, appetite change and fatigue.  HENT: Negative.  Negative for congestion, rhinorrhea and sore throat.   Eyes: Negative.  Negative for pain.  Respiratory: Negative.  Negative for cough and shortness of breath.   Cardiovascular: Negative.  Negative for chest pain, palpitations and leg swelling.  Gastrointestinal: Negative.  Negative for vomiting, abdominal pain, diarrhea and anorexia.  Endocrine: Negative.   Genitourinary: Negative.   Musculoskeletal: Negative.  Negative for back pain, joint pain and arthralgias.  Skin: Positive for rash. Negative for nail changes and color change.  Allergic/Immunologic: Negative.   Neurological: Negative.  Negative for dizziness.  Hematological: Negative.  Negative for adenopathy. Does not bruise/bleed easily.  Psychiatric/Behavioral: Negative.        Objective:   Physical Exam  Constitutional: She is oriented to person, place, and time. She appears well-developed and well-nourished. No distress.  HENT:  Head: Normocephalic and atraumatic.  Mouth/Throat: Oropharynx is clear and moist. No oropharyngeal exudate.  Eyes: Conjunctivae are normal. Right eye exhibits no discharge. Left eye exhibits no discharge. No scleral icterus.  Neck: Normal range of motion. Neck supple. No JVD  present. No tracheal deviation present. No thyromegaly present.  Cardiovascular: Normal rate, regular rhythm, normal heart sounds and intact distal pulses.  Exam reveals no gallop and no friction rub.   No murmur heard. Pulmonary/Chest: Effort normal and breath sounds normal. No stridor. No respiratory distress. She has no wheezes. She has no rales. She exhibits no tenderness.  Abdominal: Soft. Bowel sounds are normal. She exhibits no distension and no mass. There is no tenderness. There is no rebound and no guarding.  Musculoskeletal: Normal range of motion. She exhibits no edema or tenderness.  Lymphadenopathy:    She has no cervical adenopathy.  Neurological: She is oriented to person, place, and time.  Skin: Skin is warm and dry. Rash noted. No purpura noted. Rash is papular and vesicular. Rash is not macular, not maculopapular, not nodular, not pustular and not urticarial. She is not diaphoretic. No erythema. No pallor.     Vitals reviewed.   Lab Results  Component Value Date   WBC 7.3 10/31/2014   HGB 15.0 10/31/2014   HCT 45.7 10/31/2014   PLT 245 10/31/2014   GLUCOSE 119 10/31/2014   CHOL 230* 10/03/2014   TRIG 94.0 10/03/2014   HDL 65.50 10/03/2014   LDLDIRECT 158.4 07/17/2011   LDLCALC 146* 10/03/2014   ALT 48 10/31/2014   AST 33 10/31/2014   NA 142 10/31/2014   K 4.6 10/31/2014   CL 103 10/03/2014   CREATININE 1.0 10/31/2014   BUN 18.9 10/31/2014   CO2 24 10/31/2014   TSH 3.21 10/03/2014        Assessment & Plan:

## 2014-11-25 NOTE — Assessment & Plan Note (Signed)
Will treat with valtrex She has norco at home to take for pain

## 2014-11-27 ENCOUNTER — Ambulatory Visit
Admission: RE | Admit: 2014-11-27 | Discharge: 2014-11-27 | Disposition: A | Discharge status: Home / Self Care | Payer: Medicare Other | Source: Ambulatory Visit | Attending: Radiation Oncology | Admitting: Radiation Oncology

## 2014-11-27 DIAGNOSIS — C50211 Malignant neoplasm of upper-inner quadrant of right female breast: Secondary | ICD-10-CM | POA: Diagnosis not present

## 2014-11-27 NOTE — Addendum Note (Signed)
Encounter addended by: Benn Moulder, RN on: 11/27/2014  7:44 PM<BR>     Documentation filed: Charges VN

## 2014-11-27 NOTE — Progress Notes (Signed)
CC: Dr. [name], Dr. [name], Dr. [name]  Follow-up note:  Diagnosis: Pathologic stage IIA  (T2 N0 M0) invasive ductal/DCIS of the right breast  History: Ms. Tercero is a pleasant 74 year old female who is seen today for review and discussion of radiation therapy in the management of her pathologic Stage T2 N0 invasive ductal/DCIS of the right breast.  I first saw the patient at the multidisciplinary breast cancer clinic on 10/31/2014. At the time of a screening mammogram at the [facility] on 10/17/2014 she was felt to have a possible mass within the right breast. Additional views and ultrasound showed a medial right breast mass at approximately 1:00 measuring 3.1 cm within the upper inner quadrant. Ultrasound-guided biopsy on 10/19/2014 was diagnostic for invasive ductal carcinoma. This was ER positive at 100%, PR positive at 40% with a Ki-67 of 28%. The tumor was felt to be grade 2-3.She did not have a MRI scan.  She underwent a right partial mastectomy and sentinel lymph node biopsy on 11/13/2014.  She was found to have a 2.7 cm invasive ductal carcinoma along with DCIS.  3 lymph nodes were free of metastatic disease.  The closest margin was 0.6 cm anteriorly for both invasive and in situ disease.  She is doing well postoperatively.  She was seen by Dr. [name] was sent off Oncotype DX testing on April 28.  Of note is that she was recently diagnosed with shingles involving her left lower extremity, and this is much improved.  Physical examination: Alert and oriented. Wt Readings from Last 3 Encounters:  11/22/14 209 lb (94.802 kg)  11/22/14 209 lb 3.2 oz (94.892 kg)  11/06/14 215 lb (97.523 kg)   Temp Readings from Last 3 Encounters:  11/22/14 98.3 F (36.8 C) Oral  11/22/14 97.4 F (36.3 C) Oral  11/13/14 97.8 F (36.6 C)    BP Readings from Last 3 Encounters:  11/22/14 126/68  11/22/14 92/64  11/13/14 134/71   Pulse Readings from Last 3 Encounters:   11/22/14 74  11/22/14 81  11/13/14 65   Head and neck examination: Grossly unremarkable.  Nodes: Without palpable cervical, supraclavicular, or axillary lymphadenopathy.  Chest: Lungs clear.  Breasts: There is a healing partial mastectomy wound along the upper inner quadrant of the right breast extending from 11:00 to 2:00.  The wound is healing well.  No masses are appreciated.  Left breast without masses or lesions.  Extremities: Without edema.  Impression: Pathologic stage IIA (T2 N0 M0) invasive ductal/DCIS of the right breast.  She is a candidate for breast preservation.  We will await the results of her Oncotype DX testing to determine whether not she would benefit from adjuvant chemotherapy.  If she receives chemotherapy then we will obviously delay her right breast radiation therapy.  We discussed the potential acute and late toxicities of radiation therapy.  We discussed hypofractionated treatment versus standard fractionation.  There is some controversy regarding hyperfractionated radiation therapy for patients with grade 3/poorly differentiated disease and that there may be a higher recurrence rate with hypofractionated radiation therapy in this setting.  This was a subset analysis of the [place] trial in which did not reach statistical significance because of small numbers.  After a lengthy discussion she prefers standard fractionation.  I would consider giving her 5 weeks of radiation therapy with no boost.  Consent is signed today.  Plan: Await results for Oncotype DX testing.  If she does not need adjuvant chemotherapy and then we can  proceed with CT simulation later this month.  I ask that Dr. Lindi Adie left me know if he plans to give chemotherapy.  30 minutes was spent face-to-face with the patient, primarily counseling patient and coordinating her care.

## 2014-11-27 NOTE — Progress Notes (Signed)
Location of Breast Cancer:Left Breast Upper inner breast  Histology per Pathology Report: Diagnosis 10/19/14: Breast, right, needle core biopsy, upper inner- INVASIVE DUCTAL CARCINOMA  Receptor Status: ER(+ 100%, PR (+ 48%), Her2-neu (neg. )  Did patient present with symptoms (if so, please note symptoms) or was this found on screening mammography?: patient found then went or mammogram  Past/Anticipated interventions by surgeon, if DIX:[id number]:  1. Breast, lumpectomy, right - INVASIVE DUCTAL CARCINOMA, SEE COMMENT.- POSITIVE FOR PERINEURAL INVASION.- NEGATIVE FOR LYMPH VASCULAR INVASION.- DUCTAL CARCINOMA IN SITU.- PREVIOUS BIOPSY SITE.- SEE TUMOR SYNOPTIC TEMPLATE BELOW. 2. Lymph node, sentinel, biopsy, right axillary #1 - ONE LYMPH NODE, NEGATIVE FOR TUMOR (0/1). 3. Lymph node, sentinel, biopsy, right axillary #2 1 of 4 FINAL for Osterman, Stacey Navarro ([id number]) Diagnosis(continued) - ONE LYMPH NODE, NEGATIVE FOR TUMOR (0/1). 4. Lymph node, biopsy, right axillary - ONE LYMPH NODE, NEGATIVE FOR TUMOR (0/1). DR. [name], follow up June 2016  Past/Anticipated interventions by medical oncology, if any: Chemotherapy : Dr. Lindi Adie seen  In clinic 10/31/14,, last note 11/22/14  Lymphedema issues, if any: No  Pain issues, if any: No  SAFETY ISSUES:  Prior radiation? No  Pacemaker/ICD? No Possible current pregnancy?No Current Complaints / other details:Widowed, 1 child, GxP1, menses age 69, age 74 1st pregnancy, no HRT, No birth control pills, father cancer in his bones,     Stacey Navarro, Stacey Gage, RN 11/27/2014,8:45 AM

## 2014-11-29 ENCOUNTER — Encounter (HOSPITAL_COMMUNITY): Payer: Self-pay

## 2014-12-17 ENCOUNTER — Ambulatory Visit
Admission: RE | Admit: 2014-12-17 | Discharge: 2014-12-17 | Disposition: A | Discharge status: Home / Self Care | Payer: Medicare Other | Source: Ambulatory Visit | Attending: Radiation Oncology | Admitting: Radiation Oncology

## 2014-12-17 DIAGNOSIS — C50211 Malignant neoplasm of upper-inner quadrant of right female breast: Secondary | ICD-10-CM | POA: Diagnosis not present

## 2014-12-17 NOTE — Progress Notes (Signed)
Complex simulation/treatment planning note: The patient was taken to the CT simulator.  She was placed supine.  A Vac lock immobilization device was constructed on a custom breast board.  Her right breast borders were marked with radiopaque wires along with her partial mastectomy scar.  She was then scanned.  I chose an isocenter.  The CT data set was sent to the planning system where she was set up to medial and lateral right breast tangents.  2 separate and unique multileaf collimators were designed to conform the field.  I prescribing 4800 cGy in 24 sessions, no boost.  She is now ready for 3-D simulation.

## 2014-12-18 ENCOUNTER — Encounter: Payer: Self-pay | Admitting: Radiation Oncology

## 2014-12-18 DIAGNOSIS — C50211 Malignant neoplasm of upper-inner quadrant of right female breast: Secondary | ICD-10-CM | POA: Diagnosis not present

## 2014-12-18 LAB — COLOGUARD: Cologuard: NEGATIVE

## 2014-12-18 NOTE — Progress Notes (Signed)
3-D simulation note: The patient underwent 3-D simulation for treatment to her right breast.  Dose volume histograms were obtained for the target structures including her tumor bed in addition to avoidance structures including her heart and lungs.  We met our departmental guidelines.  She was set up to medial and lateral right breast tangents.  2 unique MLCs and 2 separate electronic compensation fields were used to conform the field.  Thus she had 4 complex treatment devices.  She is being treated with 6 MV photons along with 10 MV photons for her lateral tangent.  I'm prescribing 4800 cGy in 24 sessions.

## 2014-12-25 ENCOUNTER — Ambulatory Visit
Admission: RE | Admit: 2014-12-25 | Discharge: 2014-12-25 | Disposition: A | Discharge status: Home / Self Care | Payer: Medicare Other | Source: Ambulatory Visit | Attending: Radiation Oncology | Admitting: Radiation Oncology

## 2014-12-25 DIAGNOSIS — C50211 Malignant neoplasm of upper-inner quadrant of right female breast: Secondary | ICD-10-CM | POA: Diagnosis not present

## 2014-12-26 ENCOUNTER — Ambulatory Visit
Admission: RE | Admit: 2014-12-26 | Discharge: 2014-12-26 | Disposition: A | Discharge status: Home / Self Care | Payer: Medicare Other | Source: Ambulatory Visit | Attending: Radiation Oncology | Admitting: Radiation Oncology

## 2014-12-26 DIAGNOSIS — C50211 Malignant neoplasm of upper-inner quadrant of right female breast: Secondary | ICD-10-CM | POA: Diagnosis not present

## 2014-12-27 ENCOUNTER — Ambulatory Visit
Admission: RE | Admit: 2014-12-27 | Discharge: 2014-12-27 | Disposition: A | Discharge status: Home / Self Care | Payer: Medicare Other | Source: Ambulatory Visit | Attending: Radiation Oncology | Admitting: Radiation Oncology

## 2014-12-27 DIAGNOSIS — C50211 Malignant neoplasm of upper-inner quadrant of right female breast: Secondary | ICD-10-CM | POA: Diagnosis not present

## 2014-12-28 ENCOUNTER — Telehealth: Payer: Self-pay | Admitting: *Deleted

## 2014-12-28 ENCOUNTER — Ambulatory Visit
Admission: RE | Admit: 2014-12-28 | Discharge: 2014-12-28 | Disposition: A | Discharge status: Home / Self Care | Payer: Medicare Other | Source: Ambulatory Visit | Attending: Radiation Oncology | Admitting: Radiation Oncology

## 2014-12-28 DIAGNOSIS — C50211 Malignant neoplasm of upper-inner quadrant of right female breast: Secondary | ICD-10-CM

## 2014-12-28 MED ORDER — ALRA NON-METALLIC DEODORANT (RAD-ONC)
1.0000 "application " | Freq: Once | TOPICAL | Status: AC
  Administered 2014-12-28: 1 via TOPICAL

## 2014-12-28 MED ORDER — RADIAPLEXRX EX GEL
Freq: Once | CUTANEOUS | Status: AC
  Administered 2014-12-28: 16:00:00 via TOPICAL

## 2014-12-28 NOTE — Telephone Encounter (Signed)
Called pt to assess needs during xrt. Relate she is doing well and without complaints. Encourage pt to call with questions or concerns. Received verbal understanding. Contact information given.

## 2014-12-28 NOTE — Progress Notes (Signed)
Pt here for patient teaching.  Pt given Radiation and You booklet, skin care instructions, Alra deodorant and Radiaplex gel. Reviewed areas of pertinence such as fatigue, skin changes, breast tenderness and breast swelling . Pt able to give teach back of to pat skin and use unscented/gentle soap,apply Radiaplex bid, avoid applying anything to skin within 4 hours of treatment, avoid wearing an under wire bra and to use an electric razor if they must shave. Pt demonstrated understanding of information given and will contact nursing with any questions or concerns.  Given this RN's Business card. Notified that she will always see her physician on Monday after treatment, however if this changes she will be notified in advance.  She stated understanding.

## 2014-12-31 ENCOUNTER — Ambulatory Visit
Admission: RE | Admit: 2014-12-31 | Discharge: 2014-12-31 | Disposition: A | Discharge status: Home / Self Care | Payer: Medicare Other | Source: Ambulatory Visit | Attending: Radiation Oncology | Admitting: Radiation Oncology

## 2014-12-31 ENCOUNTER — Encounter: Payer: Self-pay | Admitting: Radiation Oncology

## 2014-12-31 VITALS — BP 132/73 | HR 64 | Temp 97.6°F | Resp 20 | Wt 212.2 lb

## 2014-12-31 DIAGNOSIS — C50211 Malignant neoplasm of upper-inner quadrant of right female breast: Secondary | ICD-10-CM | POA: Diagnosis not present

## 2014-12-31 NOTE — Progress Notes (Signed)
Weekly rad txs right breast 4 completed, no skin changes seen, very tan , no c/o pain , appetite good no fatigue, uses radiaplex  1:25 PM BP 132/73 mmHg  Pulse 64  Temp(Src) 97.6 F (36.4 C) (Oral)  Resp 20  Wt 212 lb 3.2 oz (96.253 kg)  Wt Readings from Last 3 Encounters:  11/22/14 209 lb (94.802 kg)  11/22/14 209 lb 3.2 oz (94.892 kg)  11/06/14 215 lb (97.523 kg)

## 2014-12-31 NOTE — Progress Notes (Signed)
Weekly Management Note:  Site: Right breast Current Dose:  800  cGy Projected Dose: 4800  cGy, no boost  Narrative: The patient is seen today for routine under treatment assessment. CBCT/MVCT images/port films were reviewed. The chart was reviewed.   She is without complaints today.  She uses Radioplex gel.  She has a non-aluminum deodorant that she would like to use an I think this is satisfactory.  She also has a body wash that she would like to continue to use and I told her that this would be okay I will she developed a skin reaction.  Physical Examination:  Filed Vitals:   12/31/14 1321  BP: 132/73  Pulse: 64  Temp: 97.6 F (36.4 C)  Resp: 20  .  Weight: 212 lb 3.2 oz (96.253 kg).  No skin changes.  Impression: Tolerating radiation therapy well.  Plan: Continue radiation therapy as planned.

## 2015-01-01 ENCOUNTER — Ambulatory Visit
Admission: RE | Admit: 2015-01-01 | Discharge: 2015-01-01 | Disposition: A | Discharge status: Home / Self Care | Payer: Medicare Other | Source: Ambulatory Visit | Attending: Radiation Oncology | Admitting: Radiation Oncology

## 2015-01-01 DIAGNOSIS — C50211 Malignant neoplasm of upper-inner quadrant of right female breast: Secondary | ICD-10-CM | POA: Diagnosis not present

## 2015-01-02 ENCOUNTER — Ambulatory Visit
Admission: RE | Admit: 2015-01-02 | Discharge: 2015-01-02 | Disposition: A | Discharge status: Home / Self Care | Payer: Medicare Other | Source: Ambulatory Visit | Attending: Radiation Oncology | Admitting: Radiation Oncology

## 2015-01-02 DIAGNOSIS — C50211 Malignant neoplasm of upper-inner quadrant of right female breast: Secondary | ICD-10-CM | POA: Diagnosis not present

## 2015-01-03 ENCOUNTER — Ambulatory Visit
Admission: RE | Admit: 2015-01-03 | Discharge: 2015-01-03 | Disposition: A | Discharge status: Home / Self Care | Payer: Medicare Other | Source: Ambulatory Visit | Attending: Radiation Oncology | Admitting: Radiation Oncology

## 2015-01-03 DIAGNOSIS — C50211 Malignant neoplasm of upper-inner quadrant of right female breast: Secondary | ICD-10-CM | POA: Diagnosis not present

## 2015-01-04 ENCOUNTER — Ambulatory Visit
Admission: RE | Admit: 2015-01-04 | Discharge: 2015-01-04 | Disposition: A | Discharge status: Home / Self Care | Payer: Medicare Other | Source: Ambulatory Visit | Attending: Radiation Oncology | Admitting: Radiation Oncology

## 2015-01-04 DIAGNOSIS — C50211 Malignant neoplasm of upper-inner quadrant of right female breast: Secondary | ICD-10-CM | POA: Diagnosis not present

## 2015-01-07 ENCOUNTER — Encounter: Payer: Self-pay | Admitting: Radiation Oncology

## 2015-01-07 ENCOUNTER — Ambulatory Visit
Admission: RE | Admit: 2015-01-07 | Discharge: 2015-01-07 | Disposition: A | Discharge status: Home / Self Care | Payer: Medicare Other | Source: Ambulatory Visit | Attending: Radiation Oncology | Admitting: Radiation Oncology

## 2015-01-07 VITALS — BP 127/71 | HR 75 | Temp 98.0°F | Resp 20 | Wt 213.9 lb

## 2015-01-07 DIAGNOSIS — C50211 Malignant neoplasm of upper-inner quadrant of right female breast: Secondary | ICD-10-CM | POA: Diagnosis not present

## 2015-01-07 MED ORDER — BIAFINE EX EMUL
Freq: Two times a day (BID) | CUTANEOUS | Status: DC
  Administered 2015-01-07: 08:00:00 via TOPICAL

## 2015-01-07 NOTE — Progress Notes (Signed)
   Weekly Management Note:  Outpatient    ICD-9-CM ICD-10-CM   1. Breast cancer of upper-inner quadrant of right female breast 174.2 C50.211 topical emolient (BIAFINE) emulsion    Current Dose:  18 Gy  Projected Dose: 48 Gy   Narrative:  The patient presents for routine under treatment assessment.  CBCT/MVCT images/Port film x-rays were reviewed.  The chart was checked. She has some increased itching of the right breast.   Physical Findings:  weight is 213 lb 14.4 oz (97.024 kg). Her oral temperature is 98 F (36.7 C). Her blood pressure is 127/71 and her pulse is 75. Her respiration is 20.   Wt Readings from Last 3 Encounters:  01/07/15 213 lb 14.4 oz (97.024 kg)  12/31/14 212 lb 3.2 oz (96.253 kg)  11/22/14 209 lb (94.802 kg)   Erythema diffusely and radiation dermititis to the superior portion of the right breast.    Impression:  The patient is tolerating radiotherapy.  Plan:  Continue radiotherapy as planned. Discussed using hydrocortisone cream to itching areas and gave her a tube of Biafine cream.  This document serves as a record of services personally performed by Eppie Gibson, MD. It was created on her behalf by Arlyce Harman, a trained medical scribe. The creation of this record is based on the scribe's personal observations and the provider's statements to them. This document has been checked and approved by the attending provider. ________________________________   Eppie Gibson, M.D.

## 2015-01-07 NOTE — Progress Notes (Addendum)
biafine cream given with instructions of use bid daily,twach back given 8:25 AM'

## 2015-01-07 NOTE — Progress Notes (Signed)
Weekly radiation treatment right breast 924 completed,  Erythema and rash on breast no pain, c.o increased  Itching, uses radiaplex bid, appetite good, energy level good stated 8:16 AM BP 127/71 mmHg  Pulse 75  Temp(Src) 98 F (36.7 C) (Oral)  Resp 20  Wt 213 lb 14.4 oz (97.024 kg)  Wt Readings from Last 3 Encounters:  01/07/15 213 lb 14.4 oz (97.024 kg)  12/31/14 212 lb 3.2 oz (96.253 kg)  11/22/14 209 lb (94.802 kg)

## 2015-01-08 ENCOUNTER — Ambulatory Visit
Admission: RE | Admit: 2015-01-08 | Discharge: 2015-01-08 | Disposition: A | Discharge status: Home / Self Care | Payer: Medicare Other | Source: Ambulatory Visit | Attending: Radiation Oncology | Admitting: Radiation Oncology

## 2015-01-08 DIAGNOSIS — C50211 Malignant neoplasm of upper-inner quadrant of right female breast: Secondary | ICD-10-CM | POA: Diagnosis not present

## 2015-01-09 ENCOUNTER — Ambulatory Visit
Admission: RE | Admit: 2015-01-09 | Discharge: 2015-01-09 | Disposition: A | Discharge status: Home / Self Care | Payer: Medicare Other | Source: Ambulatory Visit | Attending: Radiation Oncology | Admitting: Radiation Oncology

## 2015-01-09 DIAGNOSIS — C50211 Malignant neoplasm of upper-inner quadrant of right female breast: Secondary | ICD-10-CM | POA: Diagnosis not present

## 2015-01-10 ENCOUNTER — Ambulatory Visit
Admission: RE | Admit: 2015-01-10 | Discharge: 2015-01-10 | Disposition: A | Discharge status: Home / Self Care | Payer: Medicare Other | Source: Ambulatory Visit | Attending: Radiation Oncology | Admitting: Radiation Oncology

## 2015-01-10 DIAGNOSIS — C50211 Malignant neoplasm of upper-inner quadrant of right female breast: Secondary | ICD-10-CM | POA: Diagnosis not present

## 2015-01-11 ENCOUNTER — Ambulatory Visit
Admission: RE | Admit: 2015-01-11 | Discharge: 2015-01-11 | Disposition: A | Discharge status: Home / Self Care | Payer: Medicare Other | Source: Ambulatory Visit | Attending: Radiation Oncology | Admitting: Radiation Oncology

## 2015-01-11 DIAGNOSIS — C50211 Malignant neoplasm of upper-inner quadrant of right female breast: Secondary | ICD-10-CM | POA: Diagnosis not present

## 2015-01-14 ENCOUNTER — Ambulatory Visit
Admission: RE | Admit: 2015-01-14 | Discharge: 2015-01-14 | Disposition: A | Discharge status: Home / Self Care | Payer: Medicare Other | Source: Ambulatory Visit | Attending: Radiation Oncology | Admitting: Radiation Oncology

## 2015-01-14 ENCOUNTER — Encounter: Payer: Self-pay | Admitting: Radiation Oncology

## 2015-01-14 VITALS — BP 122/61 | HR 84 | Temp 97.8°F | Ht 66.0 in | Wt 206.9 lb

## 2015-01-14 DIAGNOSIS — C50211 Malignant neoplasm of upper-inner quadrant of right female breast: Secondary | ICD-10-CM | POA: Diagnosis not present

## 2015-01-14 MED ORDER — BIAFINE EX EMUL
CUTANEOUS | Status: DC | PRN
  Administered 2015-01-14: 09:00:00 via TOPICAL

## 2015-01-14 NOTE — Progress Notes (Signed)
Weekly Management Note:  Site: Right breast Current Dose:  2800  cGy Projected Dose: 4800  cGy  Narrative: The patient is seen today for routine under treatment assessment. CBCT/MVCT images/port films were reviewed. The chart was reviewed.   She does report more pruritus along the upper inner aspect of her right breast.  She has been using Biafine cream with improvement of her pruritus.  Physical Examination:  Filed Vitals:   01/14/15 0823  BP: 122/61  Pulse: 84  Temp: 97.8 F (36.6 C)  .  Weight: 206 lb 14.4 oz (93.849 kg).  There is mild to moderate erythema along the right breast which is more intense along the inframammary region.  No areas of desquamation.  Impression: Tolerating radiation therapy well.  Plan: Continue radiation therapy as planned.

## 2015-01-14 NOTE — Progress Notes (Signed)
Stacey Navarro has received 14 fractions to her right breast.  Main concern today is itching form the visible rash in the upper, inner portion of her tx field and in the inframmary fold with erythema.  States relief with the application of Biafine.  Given another tube today.  Denies fatigue.

## 2015-01-15 ENCOUNTER — Ambulatory Visit
Admission: RE | Admit: 2015-01-15 | Discharge: 2015-01-15 | Disposition: A | Discharge status: Home / Self Care | Payer: Medicare Other | Source: Ambulatory Visit | Attending: Radiation Oncology | Admitting: Radiation Oncology

## 2015-01-15 DIAGNOSIS — C50211 Malignant neoplasm of upper-inner quadrant of right female breast: Secondary | ICD-10-CM | POA: Diagnosis not present

## 2015-01-16 ENCOUNTER — Ambulatory Visit
Admission: RE | Admit: 2015-01-16 | Discharge: 2015-01-16 | Disposition: A | Discharge status: Home / Self Care | Payer: Medicare Other | Source: Ambulatory Visit | Attending: Radiation Oncology | Admitting: Radiation Oncology

## 2015-01-16 DIAGNOSIS — C50211 Malignant neoplasm of upper-inner quadrant of right female breast: Secondary | ICD-10-CM | POA: Diagnosis not present

## 2015-01-17 ENCOUNTER — Ambulatory Visit
Admission: RE | Admit: 2015-01-17 | Discharge: 2015-01-17 | Disposition: A | Discharge status: Home / Self Care | Payer: Medicare Other | Source: Ambulatory Visit | Attending: Radiation Oncology | Admitting: Radiation Oncology

## 2015-01-17 DIAGNOSIS — C50211 Malignant neoplasm of upper-inner quadrant of right female breast: Secondary | ICD-10-CM | POA: Diagnosis not present

## 2015-01-18 ENCOUNTER — Ambulatory Visit
Admission: RE | Admit: 2015-01-18 | Discharge: 2015-01-18 | Disposition: A | Discharge status: Home / Self Care | Payer: Medicare Other | Source: Ambulatory Visit | Attending: Radiation Oncology | Admitting: Radiation Oncology

## 2015-01-18 DIAGNOSIS — C50211 Malignant neoplasm of upper-inner quadrant of right female breast: Secondary | ICD-10-CM | POA: Diagnosis not present

## 2015-01-21 ENCOUNTER — Ambulatory Visit
Admission: RE | Admit: 2015-01-21 | Discharge: 2015-01-21 | Disposition: A | Discharge status: Home / Self Care | Payer: Medicare Other | Source: Ambulatory Visit | Attending: Radiation Oncology | Admitting: Radiation Oncology

## 2015-01-21 ENCOUNTER — Encounter: Payer: Self-pay | Admitting: Radiation Oncology

## 2015-01-21 VITALS — BP 121/73 | HR 78 | Temp 97.8°F | Resp 20 | Wt 211.1 lb

## 2015-01-21 DIAGNOSIS — C50211 Malignant neoplasm of upper-inner quadrant of right female breast: Secondary | ICD-10-CM

## 2015-01-21 NOTE — Progress Notes (Signed)
Weekly Management Note:  Site: Right breast Current Dose:  3800  cGy Projected Dose: 4800  cGy  Narrative: The patient is seen today for routine under treatment assessment. CBCT/MVCT images/port films were reviewed. The chart was reviewed.   She has a focal area of moist desquamation along her right axilla.  She is using Neosporin.  She is using Biafine along the remainder of the breast and cortisone where she has pruritus.  No pain.  Physical Examination:  Filed Vitals:   01/21/15 0815  BP: 121/73  Pulse: 78  Temp: 97.8 F (36.6 C)  Resp: 20  .  Weight: 211 lb 1.6 oz (95.754 kg).  There is diffuse erythema along the right breast with focal moist desquamation along her right axilla and dry desquamation along her inframammary region.  Impression: Tolerating radiation therapy well, although she does have focal moist desquamation along her right axilla.  She may very well develop moist desquamation along the inframammary region.  She will finish her radiation therapy next Tuesday.  Plan: Continue radiation therapy as planned.

## 2015-01-21 NOTE — Progress Notes (Signed)
Weekly  Right breast  19/24  Right breast  Erythema, and one area skin broken since Saturday using nesporin there top of right breast, c/o itching using biafine and cortisone , no c/o pain 8:18 AM

## 2015-01-22 ENCOUNTER — Ambulatory Visit
Admission: RE | Admit: 2015-01-22 | Discharge: 2015-01-22 | Disposition: A | Discharge status: Home / Self Care | Payer: Medicare Other | Source: Ambulatory Visit | Attending: Radiation Oncology | Admitting: Radiation Oncology

## 2015-01-22 ENCOUNTER — Telehealth: Payer: Self-pay | Admitting: Hematology and Oncology

## 2015-01-22 ENCOUNTER — Other Ambulatory Visit: Payer: Self-pay

## 2015-01-22 DIAGNOSIS — C50211 Malignant neoplasm of upper-inner quadrant of right female breast: Secondary | ICD-10-CM | POA: Diagnosis not present

## 2015-01-22 NOTE — Telephone Encounter (Signed)
Returned patients call as she needs another day in july

## 2015-01-22 NOTE — Telephone Encounter (Signed)
spoke with patient and she is aware of her follow up

## 2015-01-23 ENCOUNTER — Ambulatory Visit
Admission: RE | Admit: 2015-01-23 | Discharge: 2015-01-23 | Disposition: A | Discharge status: Home / Self Care | Payer: Medicare Other | Source: Ambulatory Visit | Attending: Radiation Oncology | Admitting: Radiation Oncology

## 2015-01-23 DIAGNOSIS — C50211 Malignant neoplasm of upper-inner quadrant of right female breast: Secondary | ICD-10-CM | POA: Diagnosis not present

## 2015-01-24 ENCOUNTER — Ambulatory Visit
Admission: RE | Admit: 2015-01-24 | Discharge: 2015-01-24 | Disposition: A | Discharge status: Home / Self Care | Payer: Medicare Other | Source: Ambulatory Visit | Attending: Radiation Oncology | Admitting: Radiation Oncology

## 2015-01-24 DIAGNOSIS — C50211 Malignant neoplasm of upper-inner quadrant of right female breast: Secondary | ICD-10-CM | POA: Diagnosis not present

## 2015-01-25 ENCOUNTER — Ambulatory Visit
Admission: RE | Admit: 2015-01-25 | Discharge: 2015-01-25 | Disposition: A | Discharge status: Home / Self Care | Payer: Medicare Other | Source: Ambulatory Visit | Attending: Radiation Oncology | Admitting: Radiation Oncology

## 2015-01-25 DIAGNOSIS — C50211 Malignant neoplasm of upper-inner quadrant of right female breast: Secondary | ICD-10-CM | POA: Diagnosis not present

## 2015-01-26 NOTE — Addendum Note (Signed)
Encounter addended by: Benn Moulder, RN on: 01/26/2015  4:10 PM<BR>     Documentation filed: Inpatient Patient Education, Demographics Visit

## 2015-01-29 ENCOUNTER — Encounter: Payer: Self-pay | Admitting: Radiation Oncology

## 2015-01-29 ENCOUNTER — Ambulatory Visit
Admission: RE | Admit: 2015-01-29 | Discharge: 2015-01-29 | Disposition: A | Discharge status: Home / Self Care | Payer: Medicare Other | Source: Ambulatory Visit | Attending: Radiation Oncology | Admitting: Radiation Oncology

## 2015-01-29 VITALS — BP 133/80 | HR 77 | Temp 97.6°F | Resp 20 | Wt 213.9 lb

## 2015-01-29 DIAGNOSIS — C50211 Malignant neoplasm of upper-inner quadrant of right female breast: Secondary | ICD-10-CM

## 2015-01-29 DIAGNOSIS — Z510 Encounter for antineoplastic radiation therapy: Secondary | ICD-10-CM | POA: Insufficient documentation

## 2015-01-29 MED ORDER — BIAFINE EX EMUL
Freq: Two times a day (BID) | CUTANEOUS | Status: DC
  Administered 2015-01-29: 09:00:00 via TOPICAL

## 2015-01-29 NOTE — Progress Notes (Signed)
Weekly Management Note:  Site: Right breast Current Dose:  4800  cGy Projected Dose: 4800  cGy  Narrative: The patient is seen today for routine under treatment assessment. CBCT/MVCT images/port films were reviewed. The chart was reviewed.   She is without new complaints today.  She is using Neosporin cream when she does have moist desquamation along the right inframammary fold.  Physical Examination:  Filed Vitals:   01/29/15 0814  BP: 133/80  Pulse: 77  Temp: 97.6 F (36.4 C)  Resp: 20  .  Weight: 213 lb 14.4 oz (97.024 kg).  There is diffuse erythema along the right breast which is more papular along the upper inner quadrant.  There is a moist desquamation along the inframammary fold as noted previously.  Impression: Tolerating radiation therapy well, except was desquamation along the inframammary fold.  She finishes her radiation therapy today.  Plan: Follow visit with me in one month.  She will continue with her Neosporin cream for at least 2-3 more days.

## 2015-01-29 NOTE — Progress Notes (Signed)
[facility] End of Treatment Note  Name:Stacey Navarro  Date: 01/29/2015 XYI:[id number] DOB:July 05, 1941   Status:outpatient    CC: [name], MD  Dr. [name]  REFERRING PHYSICIAN: Dr. [name]    DIAGNOSIS: Pathologic stage IIA (T2 N0 M0) invasive ductal/DCIS of the right breast   INDICATION FOR TREATMENT: Curative   TREATMENT DATES: 12/26/2014 through 01/29/2015                          SITE/DOSE:  Right breast 4800 cGy in 24 sessions                          BEAMS/ENERGY:  Tangential fields to the right breast with mixed 6 MV/10 MV photons                 NARRATIVE:   Stacey Navarro tolerated her treatment reasonably well, however she developed a focal moist desquamation along her right axilla and more diffuse moist desquamation along the right inframammary fold.  These areas were treated with Neosporin ointment.  She also used Radioplex gel along the remainder of her breast.                         PLAN: Routine followup in one month. Patient instructed to call if questions or worsening complaints in interim.

## 2015-01-29 NOTE — Progress Notes (Signed)
Rad txs right breast 24/24 compleetd, dermatitis on chest/breat area, c/o itching, willgive another biafine, under inframmary fold skin with moist desquamation,using neosporin there, 1 52monthf./u appt card given, appetite good Pain under inframmary fold of right breast 8:16 AM BP 133/80 mmHg  Pulse 77  Temp(Src) 97.6 F (36.4 C) (Oral)  Resp 20  Wt 213 lb 14.4 oz (97.024 kg)  Wt Readings from Last 3 Encounters:  01/29/15 213 lb 14.4 oz (97.024 kg)  01/21/15 211 lb 1.6 oz (95.754 kg)  01/14/15 206 lb 14.4 oz (93.849 kg)  JGaspar Garbe RN II Rad/Onc

## 2015-02-01 ENCOUNTER — Telehealth: Payer: Self-pay | Admitting: *Deleted

## 2015-02-01 NOTE — Telephone Encounter (Signed)
Left vm for pt to return call regarding final xrt and needs.

## 2015-02-04 ENCOUNTER — Telehealth: Payer: Self-pay | Admitting: *Deleted

## 2015-02-04 NOTE — Telephone Encounter (Signed)
Spoke to pt to assess needs after completion of xrt. Relate doing well and without complaints. Encourage pt to call with needs or questions. Received verbal understanding.

## 2015-02-12 ENCOUNTER — Ambulatory Visit (INDEPENDENT_AMBULATORY_CARE_PROVIDER_SITE_OTHER): Payer: Self-pay | Admitting: Neurology

## 2015-02-12 ENCOUNTER — Ambulatory Visit (INDEPENDENT_AMBULATORY_CARE_PROVIDER_SITE_OTHER): Payer: Medicare Other | Admitting: Neurology

## 2015-02-12 ENCOUNTER — Encounter: Payer: Self-pay | Admitting: Neurology

## 2015-02-12 ENCOUNTER — Ambulatory Visit: Payer: Medicare Other | Admitting: Hematology and Oncology

## 2015-02-12 DIAGNOSIS — G5601 Carpal tunnel syndrome, right upper limb: Secondary | ICD-10-CM | POA: Diagnosis not present

## 2015-02-12 DIAGNOSIS — G5600 Carpal tunnel syndrome, unspecified upper limb: Secondary | ICD-10-CM | POA: Insufficient documentation

## 2015-02-12 HISTORY — DX: Carpal tunnel syndrome, unspecified upper limb: G56.00

## 2015-02-12 NOTE — Procedures (Signed)
     HISTORY:  Stacey Navarro is a 74 year old patient with a one-month history of numbness in both hands, right greater than left. The patient denies any neck or shoulder discomfort. The patient is being evaluated for a possible neuropathy or a cervical radiculopathy.  NERVE CONDUCTION STUDIES:  Nerve conduction studies were performed on both upper extremities. The distal motor latencies for the median nerves were prolonged on the right, normal on the left, with normal motor amplitudes for these nerves bilaterally. The distal motor latencies and motor amplitudes for the ulnar nerves were normal bilaterally. The F wave latencies and nerve conduction velocities for the median and ulnar nerves were normal bilaterally. The sensory latency for the right median nerve was prolonged, normal for the left median nerve. The ulnar sensory latencies were within normal limits bilaterally.  EMG STUDIES:  EMG study was performed on the right upper extremity:  The first dorsal interosseous muscle reveals 2 to 4 K units with full recruitment. No fibrillations or positive waves were noted. The abductor pollicis brevis muscle reveals 2 to 5 K units with slightly reduced recruitment. No fibrillations or positive waves were noted. The extensor indicis proprius muscle reveals 1 to 3 K units with full recruitment. No fibrillations or positive waves were noted. The pronator teres muscle reveals 2 to 3 K units with full recruitment. No fibrillations or positive waves were noted. The biceps muscle reveals 1 to 2 K units with full recruitment. No fibrillations or positive waves were noted. The triceps muscle reveals 2 to 4 K units with full recruitment. No fibrillations or positive waves were noted. The anterior deltoid muscle reveals 2 to 3 K units with full recruitment. No fibrillations or positive waves were noted. The cervical paraspinal muscles were tested at 2 levels. No abnormalities of insertional activity were  seen at either level tested. There was good relaxation.  A limited EMG study was performed on the left upper extremity:  The first dorsal interosseous muscle reveals 2 to 4 K units with full recruitment. No fibrillations or positive waves were noted. The abductor pollicis brevis muscle reveals 2 to 4 K units with full recruitment. No fibrillations or positive waves were noted. The extensor indicis proprius muscle reveals 1 to 3 K units with full recruitment. No fibrillations or positive waves were noted.   IMPRESSION:  Nerve conduction studies done on both upper extremities shows evidence of a mild right carpal tunnel syndrome. There is no evidence of carpal tunnel on the left. EMG evaluation of the right upper extremity is unremarkable with exception of findings consistent with carpal tunnel syndrome. No evidence of an overlying right cervical radiculopathy is seen. A limited EMG evaluation of the left upper extremity was unremarkable.  Jill Alexanders MD 02/12/2015 4:34 PM  [facility] [address] [place], [place] [address]  Phone [phone] Fax [phone]

## 2015-02-12 NOTE — Progress Notes (Signed)
Please refer to EMG and nerve conduction study procedure note. 

## 2015-02-13 NOTE — Assessment & Plan Note (Signed)
Rt Lumpectomy 11/14/14: IDC Grade 3, 2.7 cm size, 0/3 LN , Margins clear; T2N0 Stage IIA Right breast invasive ductal carcinoma diagnosed with screening mammogram, 3.1 cm by ultrasound in the upper quadrant 6 cm from the nipple, grade 2-3, EF 100%, PR 48%, HER-2 negative ratio 0.6, Ki-67 was 28%, Oncotype DX score 19, 12% risk of recurrence, status post radiation 4800 cGy in 24 sessions completed 01/29/2015  Treatment plan: Start antiestrogen therapy with anastrozole 1 mg daily Anastrozole counseling:We discussed the risks and benefits of anti-estrogen therapy with aromatase inhibitors. These include but not limited to insomnia, hot flashes, mood changes, vaginal dryness, bone density loss, and weight gain. Although rare, serious side effects including endometrial cancer, risk of blood clots were also discussed. We strongly believe that the benefits far outweigh the risks. Patient understands these risks and consented to starting treatment. Planned treatment duration is 5 years.  Return to clinic in 3 months for toxicity check and follow-up

## 2015-02-14 ENCOUNTER — Other Ambulatory Visit: Payer: Self-pay | Admitting: *Deleted

## 2015-02-14 ENCOUNTER — Telehealth: Payer: Self-pay | Admitting: Hematology and Oncology

## 2015-02-14 ENCOUNTER — Encounter: Payer: Self-pay | Admitting: *Deleted

## 2015-02-14 ENCOUNTER — Encounter: Payer: Self-pay | Admitting: Hematology and Oncology

## 2015-02-14 ENCOUNTER — Ambulatory Visit (HOSPITAL_BASED_OUTPATIENT_CLINIC_OR_DEPARTMENT_OTHER): Payer: Medicare Other | Admitting: Hematology and Oncology

## 2015-02-14 VITALS — BP 141/77 | HR 72 | Temp 98.2°F | Resp 18 | Ht 66.0 in | Wt 215.7 lb

## 2015-02-14 DIAGNOSIS — Z79811 Long term (current) use of aromatase inhibitors: Secondary | ICD-10-CM | POA: Diagnosis not present

## 2015-02-14 DIAGNOSIS — C50211 Malignant neoplasm of upper-inner quadrant of right female breast: Secondary | ICD-10-CM | POA: Diagnosis not present

## 2015-02-14 DIAGNOSIS — Z170 Estrogen receptor positive status [ER+]: Secondary | ICD-10-CM | POA: Diagnosis not present

## 2015-02-14 MED ORDER — ANASTROZOLE 1 MG PO TABS: 1.0000 mg | ORAL_TABLET | Freq: Every day | ORAL | Status: DC

## 2015-02-14 NOTE — Progress Notes (Signed)
Patient Care Team: [name], MD as PCP - General (Internal Medicine) [name], MD as Consulting Physician (General Surgery) [name], MD as Consulting Physician (Hematology and Oncology) [name], MD as Consulting Physician (Radiation Oncology) Mauro Kaufmann, RN as Registered Nurse Rockwell Germany, RN as Registered Nurse Holley Bouche, NP as Nurse Practitioner (Nurse Practitioner) Marchia Bond, MD as Consulting Physician (Orthopedic Surgery)  DIAGNOSIS: Breast cancer of upper-inner quadrant of right female breast   Staging form: Breast, AJCC 7th Edition     Clinical stage from 10/31/2014: Stage IIA (T2, N0, M0) - Unsigned     Pathologic stage from 11/15/2014: Stage IIA (T2, N0, cM0) - Signed by Enid Cutter, MD on 11/26/2014       Staging comments: Staged on final lumpectomy specimen by Dr. Donato Heinz.    SUMMARY OF ONCOLOGIC HISTORY:   Breast cancer of upper-inner quadrant of right female breast   10/19/2014 Mammogram Right breast mass 3.1 cm upper quadrant 6 cm from the nipple   10/19/2014 Initial Diagnosis Right breast invasive ductal carcinoma grade 2-3, ER 100%, PR 48%, HER-2 negative ratio 0.6, Ki-67 28%   11/13/2014 Surgery Rt Lumpectomy: IDC Grade 3, 2.7 cm size, 0/3 LN , Margins clear; T2N0 Stage IIA, Oncotype DX score 19, 12% ROR   12/26/2014 - 01/29/2015 Radiation Therapy Right breast 4800 cGy in 24 sessions by Dr. Valere Dross   02/14/2015 -  Anti-estrogen oral therapy anastrozole 1 mg daily    CHIEF COMPLIANT: follow-up after radiation  INTERVAL HISTORY: Stacey Navarro is a 74 year old with above-mentioned history of right breast cancer treated with lumpectomy and radiation and is here to start talking about anastrozole. She has done very well from radiation standpoint has not had any problems or concerns.  REVIEW OF SYSTEMS:   Constitutional: Denies fevers, chills or abnormal weight loss Eyes: Denies blurriness of vision Ears, nose, mouth, throat, and face: Denies  mucositis or sore throat Respiratory: Denies cough, dyspnea or wheezes Cardiovascular: Denies palpitation, chest discomfort or lower extremity swelling Gastrointestinal:  Denies nausea, heartburn or change in bowel habits Skin: Denies abnormal skin rashes Lymphatics: Denies new lymphadenopathy or easy bruising Neurological:Denies numbness, tingling or new weaknesses Behavioral/Psych: Mood is stable, no new changes  Breast:  denies any pain or lumps or nodules in either breasts All other systems were reviewed with the patient and are negative.  I have reviewed the past medical history, past surgical history, social history and family history with the patient and they are unchanged from previous note.  ALLERGIES:  has No Known Allergies.  MEDICATIONS:  Current Outpatient Prescriptions  Medication Sig Dispense Refill  . emollient (BIAFINE) cream Apply 1 application topically 2 (two) times daily.    . hyaluronate sodium (RADIAPLEXRX) GEL Apply 1 application topically 2 (two) times daily.    . meloxicam (MOBIC) 15 MG tablet Take 15 mg by mouth daily.  0  . non-metallic deodorant (ALRA) MISC Apply 1 application topically daily as needed.    . pantoprazole (PROTONIX) 40 MG tablet TAKE 1 TABLET (40 MG TOTAL) BY MOUTH DAILY. 30 tablet 11  . anastrozole (ARIMIDEX) 1 MG tablet Take 1 tablet (1 mg total) by mouth daily. 90 tablet 3   No current facility-administered medications for this visit.    PHYSICAL EXAMINATION: ECOG PERFORMANCE STATUS: 0 - Asymptomatic  Filed Vitals:   02/14/15 1010  BP: 141/77  Pulse: 72  Temp: 98.2 F (36.8 C)  Resp: 18   Filed Weights   02/14/15  1010  Weight: 215 lb 11.2 oz (97.841 kg)    GENERAL:alert, no distress and comfortable SKIN: skin color, texture, turgor are normal, no rashes or significant lesions EYES: normal, Conjunctiva are pink and non-injected, sclera clear OROPHARYNX:no exudate, no erythema and lips, buccal mucosa, and tongue normal   NECK: supple, thyroid normal size, non-tender, without nodularity LYMPH:  no palpable lymphadenopathy in the cervical, axillary or inguinal LUNGS: clear to auscultation and percussion with normal breathing effort HEART: regular rate & rhythm and no murmurs and no lower extremity edema ABDOMEN:abdomen soft, non-tender and normal bowel sounds Musculoskeletal:no cyanosis of digits and no clubbing  NEURO: alert & oriented x 3 with fluent speech, no focal motor/sensory deficits   LABORATORY DATA:  I have reviewed the data as listed   Chemistry      Component Value Date/Time   NA 142 10/31/2014 0818   NA 138 10/03/2014 1055   K 4.6 10/31/2014 0818   K 4.1 10/03/2014 1055   CL 103 10/03/2014 1055   CO2 24 10/31/2014 0818   CO2 27 10/03/2014 1055   BUN 18.9 10/31/2014 0818   BUN 17 10/03/2014 1055   CREATININE 1.0 10/31/2014 0818   CREATININE 0.92 10/03/2014 1055      Component Value Date/Time   CALCIUM 9.4 10/31/2014 0818   CALCIUM 9.9 10/03/2014 1055   ALKPHOS 112 10/31/2014 0818   ALKPHOS 108 10/03/2014 1055   AST 33 10/31/2014 0818   AST 33 10/03/2014 1055   ALT 48 10/31/2014 0818   ALT 48* 10/03/2014 1055   BILITOT 0.41 10/31/2014 0818   BILITOT 0.7 10/03/2014 1055       Lab Results  Component Value Date   WBC 7.3 10/31/2014   HGB 15.0 10/31/2014   HCT 45.7 10/31/2014   MCV 94.4 10/31/2014   PLT 245 10/31/2014   NEUTROABS 4.2 10/31/2014    ASSESSMENT & PLAN:  Breast cancer of upper-inner quadrant of right female breast Rt Lumpectomy 11/14/14: IDC Grade 3, 2.7 cm size, 0/3 LN , Margins clear; T2N0 Stage IIA Right breast invasive ductal carcinoma diagnosed with screening mammogram, 3.1 cm by ultrasound in the upper quadrant 6 cm from the nipple, grade 2-3, EF 100%, PR 48%, HER-2 negative ratio 0.6, Ki-67 was 28%, Oncotype DX score 19, 12% risk of recurrence, status post radiation 4800 cGy in 24 sessions completed 01/29/2015  Treatment plan: Start antiestrogen  therapy with anastrozole 1 mg daily Anastrozole counseling:We discussed the risks and benefits of anti-estrogen therapy with aromatase inhibitors. These include but not limited to insomnia, hot flashes, mood changes, vaginal dryness, bone density loss, and weight gain. Although rare, serious side effects including endometrial cancer, risk of blood clots were also discussed. We strongly believe that the benefits far outweigh the risks. Patient understands these risks and consented to starting treatment. Planned treatment duration is 5 years.  Patient may have had a bone density test done somewhere. I would like to get the report of that. I discussed with her about taking calcium and vitamin D. however she drinks 3 glasses of milk and spends a lot of times outdoors. So I recommended that she does not need to do calcium and vitamin D supplementation. If she hasn't had a bone density, we will order another bone density to be done here. This will need to be repeated every other year.  Return to clinic in 3 months for toxicity check and follow-up    No orders of the defined types were placed in this  encounter.   The patient has a good understanding of the overall plan. she agrees with it. she will call with any problems that may develop before the next visit here.   Rulon Eisenmenger, MD

## 2015-02-14 NOTE — Telephone Encounter (Signed)
Gave avs & calendar for October. °

## 2015-02-18 ENCOUNTER — Other Ambulatory Visit: Payer: Self-pay

## 2015-02-18 ENCOUNTER — Telehealth: Payer: Self-pay | Admitting: Hematology and Oncology

## 2015-02-18 DIAGNOSIS — E2839 Other primary ovarian failure: Secondary | ICD-10-CM

## 2015-02-18 DIAGNOSIS — Z780 Asymptomatic menopausal state: Secondary | ICD-10-CM

## 2015-02-18 NOTE — Telephone Encounter (Signed)
Confirmed appointment for bone density for 08/02

## 2015-02-19 ENCOUNTER — Ambulatory Visit (INDEPENDENT_AMBULATORY_CARE_PROVIDER_SITE_OTHER): Payer: Medicare Other | Admitting: Sports Medicine

## 2015-02-19 ENCOUNTER — Encounter: Payer: Self-pay | Admitting: Sports Medicine

## 2015-02-19 VITALS — BP 129/74 | Ht 66.0 in | Wt 210.0 lb

## 2015-02-19 DIAGNOSIS — M25561 Pain in right knee: Secondary | ICD-10-CM

## 2015-02-19 MED ORDER — METHYLPREDNISOLONE ACETATE 40 MG/ML IJ SUSP
40.0000 mg | Freq: Once | INTRAMUSCULAR | Status: AC
  Administered 2015-02-19: 40 mg via INTRA_ARTICULAR

## 2015-02-19 NOTE — Progress Notes (Signed)
   Subjective:    Patient ID: Stacey Navarro, female    DOB: [birth year], 74 y.o.   MRN: [id number]  HPI chief complaint: Right knee pain  Patient comes in today with returning right knee pain. She has had similar pain in the past. She was seen in 2014 and a cortisone injection was administered which resulted in symptom resolution. She describes a diffuse pain in the knee which is present primarily with activity. Improves at rest. No swelling. No mechanical symptoms. No feelings of instability. No radiating pain. No known trauma.  Past history reviewed Medications reviewed Allergies reviewed   Review of Systems    as above Objective:   Physical Exam  Well-developed, well-nourished. No acute distress. Awake alert and oriented 3. Vital signs reviewed  Right knee: Full range of motion. Trace patellofemoral crepitus. No obvious effusion. She does have tenderness to palpation along both medial and lateral joint lines but a negative McMurray's. Negative Thessaly's. Good joint stability. Neurovascularly intact distally. Walking with only a slight limp.      Assessment & Plan:  Returning right knee pain likely secondary to DJD versus degenerative meniscal tear  Patient has responded well to cortisone injections in the past. I recommended that we repeat an injection today. Patient agrees. This was accomplished atraumatically under sterile technique utilizing an anterior lateral approach. If symptoms persist despite today's injection I would start with getting plain x-rays of her right knee. Follow-up for ongoing or Issues.  Consent obtained and verified. Time-out conducted. Noted no overlying erythema, induration, or other signs of local infection. Skin prepped in a sterile fashion. Topical analgesic spray: Ethyl chloride. Joint: right knee Needle: 25g 1.5 inch Completed without difficulty. Meds: 3cc 1% xylocaine, 1cc (15m) depomedrol  Advised to call if fevers/chills, erythema,  induration, drainage, or persistent bleeding.

## 2015-02-22 ENCOUNTER — Other Ambulatory Visit: Payer: Self-pay | Admitting: *Deleted

## 2015-02-22 DIAGNOSIS — C50211 Malignant neoplasm of upper-inner quadrant of right female breast: Secondary | ICD-10-CM

## 2015-02-22 MED ORDER — TRAMADOL HCL 50 MG PO TABS: 50.0000 mg | ORAL_TABLET | Freq: Two times a day (BID) | ORAL | Status: DC

## 2015-02-25 ENCOUNTER — Other Ambulatory Visit: Payer: Self-pay

## 2015-02-25 DIAGNOSIS — C50211 Malignant neoplasm of upper-inner quadrant of right female breast: Secondary | ICD-10-CM

## 2015-02-25 DIAGNOSIS — E2839 Other primary ovarian failure: Secondary | ICD-10-CM

## 2015-02-26 ENCOUNTER — Inpatient Hospital Stay: Admission: RE | Admit: 2015-02-26 | Payer: Medicare Other | Source: Ambulatory Visit

## 2015-02-26 ENCOUNTER — Telehealth: Payer: Self-pay

## 2015-02-26 ENCOUNTER — Other Ambulatory Visit: Payer: Medicare Other

## 2015-02-26 NOTE — Telephone Encounter (Signed)
Charting error.

## 2015-02-27 ENCOUNTER — Telehealth: Payer: Self-pay

## 2015-02-27 ENCOUNTER — Ambulatory Visit: Payer: Medicare Other | Admitting: Sports Medicine

## 2015-02-27 DIAGNOSIS — C50211 Malignant neoplasm of upper-inner quadrant of right female breast: Secondary | ICD-10-CM

## 2015-02-27 DIAGNOSIS — E2839 Other primary ovarian failure: Secondary | ICD-10-CM

## 2015-02-27 NOTE — Telephone Encounter (Signed)
Let pt know BD appt at [facility] was 8/2 at 930.  Pt to call [facility] to reschedule - provided phone number.  I will put order back in.  Pt voiced understanding.

## 2015-02-28 ENCOUNTER — Other Ambulatory Visit: Payer: Self-pay | Admitting: Hematology and Oncology

## 2015-02-28 DIAGNOSIS — C50211 Malignant neoplasm of upper-inner quadrant of right female breast: Secondary | ICD-10-CM

## 2015-02-28 DIAGNOSIS — E2839 Other primary ovarian failure: Secondary | ICD-10-CM

## 2015-03-01 ENCOUNTER — Inpatient Hospital Stay: Admission: RE | Admit: 2015-03-01 | Payer: Medicare Other | Source: Ambulatory Visit

## 2015-03-01 ENCOUNTER — Ambulatory Visit
Admission: RE | Admit: 2015-03-01 | Discharge: 2015-03-01 | Disposition: A | Discharge status: Home / Self Care | Payer: Medicare Other | Source: Ambulatory Visit | Attending: Hematology and Oncology | Admitting: Hematology and Oncology

## 2015-03-01 DIAGNOSIS — C50211 Malignant neoplasm of upper-inner quadrant of right female breast: Secondary | ICD-10-CM

## 2015-03-01 DIAGNOSIS — E2839 Other primary ovarian failure: Secondary | ICD-10-CM

## 2015-03-02 LAB — COLOGUARD: Cologuard: NEGATIVE

## 2015-03-06 ENCOUNTER — Encounter: Payer: Self-pay | Admitting: Radiation Oncology

## 2015-03-12 ENCOUNTER — Ambulatory Visit
Admission: RE | Admit: 2015-03-12 | Discharge: 2015-03-12 | Disposition: A | Discharge status: Home / Self Care | Payer: Medicare Other | Source: Ambulatory Visit | Attending: Radiation Oncology | Admitting: Radiation Oncology

## 2015-03-12 VITALS — BP 129/86 | HR 72 | Temp 98.1°F | Ht 66.0 in | Wt 212.1 lb

## 2015-03-12 DIAGNOSIS — C50211 Malignant neoplasm of upper-inner quadrant of right female breast: Secondary | ICD-10-CM | POA: Insufficient documentation

## 2015-03-12 HISTORY — DX: Personal history of irradiation: Z92.3

## 2015-03-12 MED ORDER — RADIAPLEXRX EX GEL
Freq: Once | CUTANEOUS | Status: AC
  Administered 2015-03-12: 11:00:00 via TOPICAL

## 2015-03-12 NOTE — Addendum Note (Signed)
Encounter addended by: Benn Moulder, RN on: 03/12/2015  5:34 PM<BR>     Documentation filed: Charges VN

## 2015-03-12 NOTE — Progress Notes (Signed)
Stacey Navarro here for reassessment s/p xrt to her right breast.  No voiced concerns at this time.  Started Arimidex on 02/14/15

## 2015-03-12 NOTE — Progress Notes (Signed)
CC: Dr. [name], Dr. [name]  Follow-up note:  Stacey Navarro returns today approximately 5 weeks for completion of radiation therapy following conservative surgery in the management of her T2 N0 invasive ductal/DCIS of the right breast.  She is without new complaints today except for hot flashes, having started anastrozole approximately 3 weeks ago.  She tells that she will see Dr. Dalbert Batman for a follow-up visit in December.  She will see Dr. Lindi Adie for a follow-up visit in October.  Physical examination: Alert and oriented. Filed Vitals:   03/12/15 1112  BP: 129/86  Pulse: 72  Temp: 98.1 F (36.7 C)   Head and neck examination: Grossly unremarkable.  Nodes: Without palpable cervical, supraclavicular, or axillary lymphadenopathy.  Breasts: There is residual erythema along the right breast.  There is minimal breast thickening.  No masses are appreciated.  Left breast without masses or lesions.  Extremities: Without edema.  Impression: Satisfactory progress.  Her presenting mammogram was in March, so she can wait until early next year for her annual/baseline mammography.  I've not scheduled the patient for a formal follow-up visit is since she will be followed closely by Dr. Lindi Adie.  Plan: As above.

## 2015-03-18 ENCOUNTER — Telehealth: Payer: Self-pay

## 2015-03-18 NOTE — Telephone Encounter (Signed)
Bone density results dtd 03/01/15 rcvd from [facility].  Reviewed by Dr. Lindi Adie.  Sent to scan.

## 2015-03-26 ENCOUNTER — Other Ambulatory Visit: Payer: Self-pay | Admitting: Nurse Practitioner

## 2015-03-26 DIAGNOSIS — C50211 Malignant neoplasm of upper-inner quadrant of right female breast: Secondary | ICD-10-CM

## 2015-03-29 ENCOUNTER — Telehealth: Payer: Self-pay | Admitting: Hematology and Oncology

## 2015-03-29 NOTE — Telephone Encounter (Signed)
Spoke with about survivorship and she declines a visit at this time

## 2015-04-25 ENCOUNTER — Encounter (HOSPITAL_BASED_OUTPATIENT_CLINIC_OR_DEPARTMENT_OTHER): Payer: Medicare Other | Attending: Orthopedic Surgery

## 2015-04-25 ENCOUNTER — Encounter (HOSPITAL_BASED_OUTPATIENT_CLINIC_OR_DEPARTMENT_OTHER): Payer: Self-pay | Admitting: *Deleted

## 2015-04-25 DIAGNOSIS — Z01818 Encounter for other preprocedural examination: Secondary | ICD-10-CM | POA: Diagnosis not present

## 2015-04-25 DIAGNOSIS — Z923 Personal history of irradiation: Secondary | ICD-10-CM | POA: Diagnosis not present

## 2015-04-25 DIAGNOSIS — E785 Hyperlipidemia, unspecified: Secondary | ICD-10-CM | POA: Diagnosis not present

## 2015-04-25 DIAGNOSIS — Z853 Personal history of malignant neoplasm of breast: Secondary | ICD-10-CM | POA: Diagnosis not present

## 2015-04-25 DIAGNOSIS — M1711 Unilateral primary osteoarthritis, right knee: Secondary | ICD-10-CM | POA: Diagnosis not present

## 2015-04-25 DIAGNOSIS — Z87891 Personal history of nicotine dependence: Secondary | ICD-10-CM | POA: Diagnosis not present

## 2015-04-25 LAB — SURGICAL PCR SCREEN
MRSA, PCR: NEGATIVE
Staphylococcus aureus: NEGATIVE

## 2015-04-26 ENCOUNTER — Other Ambulatory Visit: Payer: Self-pay | Admitting: Orthopedic Surgery

## 2015-05-03 ENCOUNTER — Ambulatory Visit (HOSPITAL_BASED_OUTPATIENT_CLINIC_OR_DEPARTMENT_OTHER)
Admission: RE | Admit: 2015-05-03 | Discharge: 2015-05-03 | Disposition: A | Discharge status: Home / Self Care | Payer: Medicare Other | Source: Ambulatory Visit | Attending: Orthopedic Surgery | Admitting: Orthopedic Surgery

## 2015-05-03 ENCOUNTER — Encounter (HOSPITAL_BASED_OUTPATIENT_CLINIC_OR_DEPARTMENT_OTHER)
Admission: RE | Disposition: A | Discharge status: Home / Self Care | Payer: Self-pay | Source: Ambulatory Visit | Attending: Orthopedic Surgery

## 2015-05-03 ENCOUNTER — Ambulatory Visit (HOSPITAL_COMMUNITY): Payer: Medicare Other

## 2015-05-03 ENCOUNTER — Ambulatory Visit (HOSPITAL_BASED_OUTPATIENT_CLINIC_OR_DEPARTMENT_OTHER): Payer: Medicare Other | Admitting: Certified Registered"

## 2015-05-03 ENCOUNTER — Encounter (HOSPITAL_BASED_OUTPATIENT_CLINIC_OR_DEPARTMENT_OTHER): Payer: Self-pay

## 2015-05-03 DIAGNOSIS — Z923 Personal history of irradiation: Secondary | ICD-10-CM | POA: Insufficient documentation

## 2015-05-03 DIAGNOSIS — Z853 Personal history of malignant neoplasm of breast: Secondary | ICD-10-CM | POA: Insufficient documentation

## 2015-05-03 DIAGNOSIS — Z87891 Personal history of nicotine dependence: Secondary | ICD-10-CM | POA: Insufficient documentation

## 2015-05-03 DIAGNOSIS — E785 Hyperlipidemia, unspecified: Secondary | ICD-10-CM | POA: Diagnosis not present

## 2015-05-03 DIAGNOSIS — M1711 Unilateral primary osteoarthritis, right knee: Secondary | ICD-10-CM | POA: Diagnosis not present

## 2015-05-03 DIAGNOSIS — Z96651 Presence of right artificial knee joint: Secondary | ICD-10-CM

## 2015-05-03 HISTORY — PX: PARTIAL KNEE ARTHROPLASTY: SHX2174

## 2015-05-03 HISTORY — DX: Unilateral primary osteoarthritis, right knee: M17.11

## 2015-05-03 LAB — POCT I-STAT, CHEM 8
BUN: 29 mg/dL — AB (ref 6–20)
CALCIUM ION: 1.02 mmol/L — AB (ref 1.13–1.30)
CHLORIDE: 110 mmol/L (ref 101–111)
Creatinine, Ser: 0.8 mg/dL (ref 0.44–1.00)
Glucose, Bld: 116 mg/dL — ABNORMAL HIGH (ref 65–99)
HEMATOCRIT: 44 % (ref 36.0–46.0)
Hemoglobin: 15 g/dL (ref 12.0–15.0)
Potassium: 4.7 mmol/L (ref 3.5–5.1)
SODIUM: 138 mmol/L (ref 135–145)
TCO2: 21 mmol/L (ref 0–100)

## 2015-05-03 LAB — CBC
HCT: 43.4 % (ref 36.0–46.0)
HEMOGLOBIN: 14.4 g/dL (ref 12.0–15.0)
MCH: 32.7 pg (ref 26.0–34.0)
MCHC: 33.2 g/dL (ref 30.0–36.0)
MCV: 98.6 fL (ref 78.0–100.0)
Platelets: 220 10*3/uL (ref 150–400)
RBC: 4.4 MIL/uL (ref 3.87–5.11)
RDW: 13.9 % (ref 11.5–15.5)
WBC: 5.8 10*3/uL (ref 4.0–10.5)

## 2015-05-03 SURGERY — ARTHROPLASTY, KNEE, UNICOMPARTMENTAL
Anesthesia: General | Site: Knee | Laterality: Right

## 2015-05-03 MED ORDER — RIVAROXABAN 10 MG PO TABS: 10.0000 mg | ORAL_TABLET | Freq: Every day | ORAL | Status: DC

## 2015-05-03 MED ORDER — HYDROMORPHONE HCL 1 MG/ML IJ SOLN
0.2500 mg | INTRAMUSCULAR | Status: DC | PRN
  Administered 2015-05-03 (×2): 0.5 mg via INTRAVENOUS
  Administered 2015-05-03: 0.25 mg via INTRAVENOUS
  Administered 2015-05-03: 0.5 mg via INTRAVENOUS
  Administered 2015-05-03: 0.25 mg via INTRAVENOUS

## 2015-05-03 MED ORDER — GLYCOPYRROLATE 0.2 MG/ML IJ SOLN: 0.2000 mg | Freq: Once | INTRAMUSCULAR | Status: DC | PRN

## 2015-05-03 MED ORDER — LIDOCAINE-EPINEPHRINE (PF) 1.5 %-1:200000 IJ SOLN
INTRAMUSCULAR | Status: DC | PRN
  Administered 2015-05-03: 15 mL via PERINEURAL

## 2015-05-03 MED ORDER — FENTANYL CITRATE (PF) 100 MCG/2ML IJ SOLN
50.0000 ug | INTRAMUSCULAR | Status: AC | PRN
  Administered 2015-05-03: 25 ug via INTRAVENOUS
  Administered 2015-05-03: 50 ug via INTRAVENOUS
  Administered 2015-05-03: 25 ug via INTRAVENOUS
  Administered 2015-05-03: 50 ug via INTRAVENOUS
  Administered 2015-05-03 (×2): 25 ug via INTRAVENOUS
  Administered 2015-05-03: 50 ug via INTRAVENOUS

## 2015-05-03 MED ORDER — PROPOFOL 10 MG/ML IV BOLUS
INTRAVENOUS | Status: DC | PRN
  Administered 2015-05-03: 200 mg via INTRAVENOUS

## 2015-05-03 MED ORDER — BUPIVACAINE HCL (PF) 0.5 % IJ SOLN
INTRAMUSCULAR | Status: DC | PRN
  Administered 2015-05-03: 25 mL via PERINEURAL

## 2015-05-03 MED ORDER — PROMETHAZINE HCL 25 MG/ML IJ SOLN: 6.2500 mg | INTRAMUSCULAR | Status: DC | PRN

## 2015-05-03 MED ORDER — FENTANYL CITRATE (PF) 100 MCG/2ML IJ SOLN
INTRAMUSCULAR | Status: AC
  Filled 2015-05-03: qty 4

## 2015-05-03 MED ORDER — OXYCODONE-ACETAMINOPHEN 10-325 MG PO TABS: 1.0000 | ORAL_TABLET | Freq: Four times a day (QID) | ORAL | Status: DC | PRN

## 2015-05-03 MED ORDER — DEXAMETHASONE SODIUM PHOSPHATE 10 MG/ML IJ SOLN
INTRAMUSCULAR | Status: DC | PRN
  Administered 2015-05-03: 10 mg via INTRAVENOUS

## 2015-05-03 MED ORDER — FENTANYL CITRATE (PF) 100 MCG/2ML IJ SOLN
25.0000 ug | INTRAMUSCULAR | Status: DC | PRN
  Administered 2015-05-03 (×2): 25 ug via INTRAVENOUS
  Administered 2015-05-03: 50 ug via INTRAVENOUS

## 2015-05-03 MED ORDER — LACTATED RINGERS IV SOLN
INTRAVENOUS | Status: DC
  Administered 2015-05-03 (×2): via INTRAVENOUS

## 2015-05-03 MED ORDER — SODIUM CHLORIDE 0.9 % IR SOLN
Status: DC | PRN
  Administered 2015-05-03: 1

## 2015-05-03 MED ORDER — CEFAZOLIN SODIUM-DEXTROSE 2-3 GM-% IV SOLR
2.0000 g | INTRAVENOUS | Status: AC
  Administered 2015-05-03: 2 g via INTRAVENOUS

## 2015-05-03 MED ORDER — FENTANYL CITRATE (PF) 100 MCG/2ML IJ SOLN
INTRAMUSCULAR | Status: AC
  Filled 2015-05-03: qty 2

## 2015-05-03 MED ORDER — ONDANSETRON HCL 4 MG/2ML IJ SOLN
INTRAMUSCULAR | Status: DC | PRN
  Administered 2015-05-03: 4 mg via INTRAVENOUS

## 2015-05-03 MED ORDER — MIDAZOLAM HCL 2 MG/2ML IJ SOLN
1.0000 mg | INTRAMUSCULAR | Status: DC | PRN
  Administered 2015-05-03: 2 mg via INTRAVENOUS

## 2015-05-03 MED ORDER — SCOPOLAMINE 1 MG/3DAYS TD PT72: 1.0000 | MEDICATED_PATCH | Freq: Once | TRANSDERMAL | Status: DC | PRN

## 2015-05-03 MED ORDER — HYDROMORPHONE HCL 1 MG/ML IJ SOLN
INTRAMUSCULAR | Status: AC
  Filled 2015-05-03: qty 1

## 2015-05-03 MED ORDER — SENNA-DOCUSATE SODIUM 8.6-50 MG PO TABS: 2.0000 | ORAL_TABLET | Freq: Every day | ORAL | Status: DC

## 2015-05-03 MED ORDER — SODIUM CHLORIDE 0.9 % IV SOLN
INTRAVENOUS | Status: DC
  Administered 2015-05-03: 13:00:00 via INTRAVENOUS

## 2015-05-03 MED ORDER — LIDOCAINE HCL (CARDIAC) 20 MG/ML IV SOLN
INTRAVENOUS | Status: DC | PRN
  Administered 2015-05-03: 30 mg via INTRAVENOUS

## 2015-05-03 MED ORDER — CEFAZOLIN SODIUM-DEXTROSE 2-3 GM-% IV SOLR
INTRAVENOUS | Status: AC
  Filled 2015-05-03: qty 50

## 2015-05-03 MED ORDER — KETOROLAC TROMETHAMINE 30 MG/ML IJ SOLN
30.0000 mg | Freq: Once | INTRAMUSCULAR | Status: AC
  Administered 2015-05-03: 30 mg via INTRAVENOUS

## 2015-05-03 MED ORDER — MIDAZOLAM HCL 2 MG/2ML IJ SOLN
INTRAMUSCULAR | Status: AC
  Filled 2015-05-03: qty 2

## 2015-05-03 MED ORDER — ONDANSETRON HCL 4 MG PO TABS: 4.0000 mg | ORAL_TABLET | Freq: Three times a day (TID) | ORAL | Status: DC | PRN

## 2015-05-03 MED ORDER — BACLOFEN 10 MG PO TABS: 10.0000 mg | ORAL_TABLET | Freq: Three times a day (TID) | ORAL | Status: DC

## 2015-05-03 MED ORDER — KETOROLAC TROMETHAMINE 30 MG/ML IJ SOLN
INTRAMUSCULAR | Status: AC
  Filled 2015-05-03: qty 1

## 2015-05-03 SURGICAL SUPPLY — 63 items
BANDAGE ELASTIC 6 VELCRO ST LF (GAUZE/BANDAGES/DRESSINGS) ×2 IMPLANT
BANDAGE ESMARK 6X9 LF (GAUZE/BANDAGES/DRESSINGS) ×1 IMPLANT
BLADE SURG 10 STRL SS (BLADE) ×2 IMPLANT
BLADE SURG 15 STRL LF DISP TIS (BLADE) ×2 IMPLANT
BLADE SURG 15 STRL SS (BLADE) ×4
BNDG CMPR 9X6 STRL LF SNTH (GAUZE/BANDAGES/DRESSINGS) ×1
BNDG ESMARK 6X9 LF (GAUZE/BANDAGES/DRESSINGS) ×2
BOWL SMART MIX CTS (DISPOSABLE) ×2 IMPLANT
CANISTER SUCT 1200ML W/VALVE (MISCELLANEOUS) ×1 IMPLANT
CANISTER SUCTION 2500CC (MISCELLANEOUS) ×2 IMPLANT
CAPT KNEE PARTIAL 2 ×1 IMPLANT
CEMENT HV SMART SET (Cement) ×2 IMPLANT
CLSR STERI-STRIP ANTIMIC 1/2X4 (GAUZE/BANDAGES/DRESSINGS) ×2 IMPLANT
COVER BACK TABLE 60X90IN (DRAPES) ×2 IMPLANT
CUFF TOURNIQUET SINGLE 34IN LL (TOURNIQUET CUFF) ×1 IMPLANT
DECANTER SPIKE VIAL GLASS SM (MISCELLANEOUS) IMPLANT
DRAPE EXTREMITY T 121X128X90 (DRAPE) ×2 IMPLANT
DRAPE U 20/CS (DRAPES) ×2 IMPLANT
DRAPE U-SHAPE 47X51 STRL (DRAPES) ×2 IMPLANT
DRSG PAD ABDOMINAL 8X10 ST (GAUZE/BANDAGES/DRESSINGS) ×2 IMPLANT
DURAPREP 26ML APPLICATOR (WOUND CARE) ×2 IMPLANT
ELECT REM PT RETURN 9FT ADLT (ELECTROSURGICAL)
ELECTRODE REM PT RTRN 9FT ADLT (ELECTROSURGICAL) ×1 IMPLANT
FACESHIELD WRAPAROUND (MASK) ×4 IMPLANT
FACESHIELD WRAPAROUND OR TEAM (MASK) ×2 IMPLANT
GAUZE SPONGE 4X4 12PLY STRL (GAUZE/BANDAGES/DRESSINGS) ×2 IMPLANT
GLOVE BIO SURGEON STRL SZ 6.5 (GLOVE) ×1 IMPLANT
GLOVE BIO SURGEON STRL SZ8 (GLOVE) ×3 IMPLANT
GLOVE BIOGEL PI IND STRL 7.0 (GLOVE) IMPLANT
GLOVE BIOGEL PI IND STRL 8 (GLOVE) ×2 IMPLANT
GLOVE BIOGEL PI INDICATOR 7.0 (GLOVE) ×2
GLOVE BIOGEL PI INDICATOR 8 (GLOVE) ×3
GLOVE ORTHO TXT STRL SZ7.5 (GLOVE) ×2 IMPLANT
GOWN STRL REUS W/ TWL LRG LVL3 (GOWN DISPOSABLE) ×1 IMPLANT
GOWN STRL REUS W/ TWL XL LVL3 (GOWN DISPOSABLE) ×2 IMPLANT
GOWN STRL REUS W/TWL LRG LVL3 (GOWN DISPOSABLE) ×2
GOWN STRL REUS W/TWL XL LVL3 (GOWN DISPOSABLE) ×6
HANDPIECE INTERPULSE COAX TIP (DISPOSABLE) ×2
IMMOBILIZER KNEE 22 UNIV (SOFTGOODS) ×2 IMPLANT
IMMOBILIZER KNEE 24 THIGH 36 (MISCELLANEOUS) ×1 IMPLANT
IMMOBILIZER KNEE 24 UNIV (MISCELLANEOUS) ×2
NS IRRIG 1000ML POUR BTL (IV SOLUTION) ×2 IMPLANT
PACK ARTHROSCOPY DSU (CUSTOM PROCEDURE TRAY) ×2 IMPLANT
PACK BASIN DAY SURGERY FS (CUSTOM PROCEDURE TRAY) ×2 IMPLANT
PENCIL BUTTON HOLSTER BLD 10FT (ELECTRODE) ×2 IMPLANT
SAWBLADE OXFORD PARTIAL (BLADE) ×2 IMPLANT
SET HNDPC FAN SPRY TIP SCT (DISPOSABLE) ×1 IMPLANT
SHEET MEDIUM DRAPE 40X70 STRL (DRAPES) ×2 IMPLANT
SLEEVE SCD COMPRESS KNEE MED (MISCELLANEOUS) ×2 IMPLANT
SPONGE LAP 18X18 X RAY DECT (DISPOSABLE) ×2 IMPLANT
SUCTION FRAZIER TIP 10 FR DISP (SUCTIONS) ×2 IMPLANT
SUT MNCRL AB 4-0 PS2 18 (SUTURE) IMPLANT
SUT VIC AB 0 CT1 27 (SUTURE) ×2
SUT VIC AB 0 CT1 27XBRD ANBCTR (SUTURE) ×1 IMPLANT
SUT VIC AB 2-0 SH 27 (SUTURE) ×2
SUT VIC AB 2-0 SH 27XBRD (SUTURE) ×1 IMPLANT
SUT VICRYL 3-0 CR8 SH (SUTURE) ×2 IMPLANT
SUT VICRYL 4-0 PS2 18IN ABS (SUTURE) IMPLANT
SYR BULB IRRIGATION 50ML (SYRINGE) ×2 IMPLANT
TOWEL OR 17X24 6PK STRL BLUE (TOWEL DISPOSABLE) ×2 IMPLANT
TOWEL OR NON WOVEN STRL DISP B (DISPOSABLE) ×4 IMPLANT
TUBE CONNECTING 20X1/4 (TUBING) ×1 IMPLANT
YANKAUER SUCT BULB TIP NO VENT (SUCTIONS) IMPLANT

## 2015-05-03 NOTE — Op Note (Signed)
05/03/2015  11:24 AM  PATIENT:  Stacey Navarro    PRE-OPERATIVE DIAGNOSIS:  unilateral primary osteoarthritis right knee M17.11  POST-OPERATIVE DIAGNOSIS:  Same  PROCEDURE:  RIGHT UNICOMPARTMENTAL KNEE  SURGEON:  Johnny Bridge, MD  PHYSICIAN ASSISTANT: Joya Gaskins, OPA-C, present and scrubbed throughout the case, critical for completion in a timely fashion, and for retraction, instrumentation, and closure.  ANESTHESIA:   General  PREOPERATIVE INDICATIONS:  Stacey Navarro is a  74 y.o. female with a diagnosis of unilateral primary osteoarthritis right knee M17.11 who failed conservative measures and elected for surgical management.    The risks benefits and alternatives were discussed with the patient preoperatively including but not limited to the risks of infection, bleeding, nerve injury, cardiopulmonary complications, blood clots, the need for revision surgery, among others, and the patient was willing to proceed.  OPERATIVE IMPLANTS: Biomet [place] mobile bearing medial compartment arthroplasty femur size small, tibia size a, bearing size 5.  OPERATIVE FINDINGS: She had extensive grade 3 and some grade 4 chondral changes within the medial compartment on both tibia and the femur. Overall bone quality was relatively poor. The lateral compartment had a little bit of wear on the medial aspect of the lateral femoral condyle, and a little bit of wear on the medial aspect of the patellar facet. These changes were fairly minor however in the lateral or patellofemoral joint.  The ACL was intact.  OPERATIVE PROCEDURE: The patient was brought to the operating room placed in supine position. General anesthesia was administered. IV antibiotics were given. The lower extremity was placed in the legholder and prepped and draped in usual sterile fashion.  Time out was performed.  The leg was elevated and exsanguinated and the tourniquet was inflated. Anteromedial incision was performed, and I  took care to preserve the MCL. Parapatellar incision was carried out, and there really were not very many osteophytes, and I excised the medial meniscus with a portion of the fat pad.   The extra medullary tibial cutting jig was applied, using the spoon and the 43m G-Clamp and the 2 mm shim, and I took care to protect the anterior cruciate ligament insertion and the tibial spine. The medial collateral ligament was also protected, and I resected my proximal tibia, matching the anatomic slope.   The proximal tibial bony cut was removed in one piece, and I turned my attention to the femur.  The intramedullary femoral rod was placed using the drill, and then using the appropriate reference, I assembled the femoral jig, setting my posterior cutting block. I resected my posterior femur, used the 0 spigot for the anterior femur, and then measured my gap.   I then used the appropriate mill to match the extension gap to the flexion gap. The second milling was at a 5, and then again at a 6.  The gaps were then measured again with the appropriate feeler gauges. Once I had balanced flexion and extension gaps, I then completed the preparation of the femur.  I milled off the anterior aspect of the distal femur to prevent impingement. I also exposed the tibia, and selected the above-named component, and then used the cutting jig to prepare the keel slot on the tibia. I also used the awl to curette out the bone to complete the preparation of the keel. The back wall was intact. The size of the tibia was in between and a, and a beat. The B was fairly overhung medially, but she was larger from front to  back. I settled with the a optimizing the medial to lateral coverage.  I then placed trial components, and it was found to have excellent motion, and appropriate balance.  I then cemented the components into place, cementing the tibia first, removing all excess cement, and then cementing the femur.  All loose cement was  removed.  The real polyethylene insert was applied manually, and the knee was taken through functional range of motion, and found to have excellent stability and restoration of joint motion, with excellent balance.  The wounds were irrigated copiously, and the parapatellar tissue closed with Vicryl, followed by Vicryl for the subcutaneous tissue, with routine closure with Steri-Strips and sterile gauze.  The tourniquet was released, and the patient was awakened and extubated and returned to PACU in stable and satisfactory condition. There were no complications.

## 2015-05-03 NOTE — Anesthesia Procedure Notes (Addendum)
Anesthesia Regional Block:  Femoral nerve block  Pre-Anesthetic Checklist: ,, timeout performed, Correct Patient, Correct Site, Correct Laterality, Correct Procedure, Correct Position, site marked, Risks and benefits discussed,  Surgical consent,  Pre-op evaluation,  At surgeon's request and post-op pain management  Laterality: Right  Prep: chloraprep       Needles:  Injection technique: Single-shot  Needle Type: Echogenic Stimulator Needle     Needle Length: 9cm 9 cm Needle Gauge: 21 and 21 G    Additional Needles:  Procedures: ultrasound guided (picture in chart) Femoral nerve block  Nerve Stimulator or Paresthesia:  Response: 0.5 mA,   Additional Responses:   Narrative:  Injection made incrementally with aspirations every 5 mL.  Performed by: Personally   Additional Notes: Patient tolerated the procedure well without complications   Procedure Name: LMA Insertion Date/Time: 05/03/2015 10:02 AM Performed by: Braylei Totino D Pre-anesthesia Checklist: Patient identified, Emergency Drugs available, Suction available and Patient being monitored Patient Re-evaluated:Patient Re-evaluated prior to inductionOxygen Delivery Method: Circle System Utilized Preoxygenation: Pre-oxygenation with 100% oxygen Intubation Type: IV induction Ventilation: Mask ventilation without difficulty LMA: LMA inserted LMA Size: 4.0 Number of attempts: 1 Airway Equipment and Method: Bite block Placement Confirmation: positive ETCO2 Tube secured with: Tape Dental Injury: Teeth and Oropharynx as per pre-operative assessment

## 2015-05-03 NOTE — Transfer of Care (Signed)
Immediate Anesthesia Transfer of Care Note  Patient: Stacey Navarro  Procedure(s) Performed: Procedure(s): RIGHT UNICOMPARTMENTAL KNEE (Right)  Patient Location: PACU  Anesthesia Type:GA combined with regional for post-op pain  Level of Consciousness: awake, alert , oriented and patient cooperative  Airway & Oxygen Therapy: Patient Spontanous Breathing and Patient connected to face mask oxygen  Post-op Assessment: Report given to RN and Post -op Vital signs reviewed and stable  Post vital signs: Reviewed and stable  Last Vitals:  Filed Vitals:   05/03/15 0945  BP: 129/76  Pulse: 61  Temp:   Resp: 11    Complications: No apparent anesthesia complications

## 2015-05-03 NOTE — Progress Notes (Signed)
Assisted Dr. Ermalene Postin with right, ultrasound guided, femoral block. Side rails up, monitors on throughout procedure. See vital signs in flow sheet. Tolerated Procedure well.

## 2015-05-03 NOTE — Discharge Instructions (Signed)
Diet: As you were doing prior to hospitalization   Shower:  May shower but keep the wounds dry, use an occlusive plastic wrap, NO SOAKING IN TUB.  If the bandage gets wet, change with a clean dry gauze.  If you have a splint on, leave the splint in place and keep the splint dry with a plastic bag.  Dressing:  You may change your dressing 3-5 days after surgery, unless you have a splint.  If you have a splint, then just leave the splint in place and we will change your bandages during your first follow-up appointment.    If you had hand or foot surgery, we will plan to remove your stitches in about 2 weeks in the office.  For all other surgeries, there are sticky tapes (steri-strips) on your wounds and all the stitches are absorbable.  Leave the steri-strips in place when changing your dressings, they will peel off with time, usually 2-3 weeks.  Activity:  Increase activity slowly as tolerated, but follow the weight bearing instructions below.  The rules on driving is that you can not be taking narcotics while you drive, and you must feel in control of the vehicle.    Weight Bearing:   As tolerated.    To prevent constipation: you may use a stool softener such as -  Colace (over the counter) 100 mg by mouth twice a day  Drink plenty of fluids (prune juice may be helpful) and high fiber foods Miralax (over the counter) for constipation as needed.    Itching:  If you experience itching with your medications, try taking only a single pain pill, or even half a pain pill at a time.  You may take up to 10 pain pills per day, and you can also use benadryl over the counter for itching or also to help with sleep.   Precautions:  If you experience chest pain or shortness of breath - call 911 immediately for transfer to the hospital emergency department!!  If you develop a fever greater that 101 F, purulent drainage from wound, increased redness or drainage from wound, or calf pain -- Call the office at  [phone]                                                Follow- Up Appointment:  Please call for an appointment to be seen in 2 weeks [place] - [phone]   Regional Anesthesia Blocks  1. Numbness or the inability to move the "blocked" extremity may last from 3-48 hours after placement. The length of time depends on the medication injected and your individual response to the medication. If the numbness is not going away after 48 hours, call your surgeon.  2. The extremity that is blocked will need to be protected until the numbness is gone and the  Strength has returned. Because you cannot feel it, you will need to take extra care to avoid injury. Because it may be weak, you may have difficulty moving it or using it. You may not know what position it is in without looking at it while the block is in effect.  3. For blocks in the legs and feet, returning to weight bearing and walking needs to be done carefully. You will need to wait until the numbness is entirely gone and the strength has returned. You should be able  to move your leg and foot normally before you try and bear weight or walk. You will need someone to be with you when you first try to ensure you do not fall and possibly risk injury.  4. Bruising and tenderness at the needle site are common side effects and will resolve in a few days.  5. Persistent numbness or new problems with movement should be communicated to the surgeon or the [place] [phone] [place] [phone]).  Post Anesthesia Home Care Instructions  Activity: Get plenty of rest for the remainder of the day. A responsible adult should stay with you for 24 hours following the procedure.  For the next 24 hours, DO NOT: -Drive a car -Paediatric nurse -Drink alcoholic beverages -Take any medication unless instructed by your physician -Make any legal decisions or sign important papers.  Meals: Start with liquid foods  such as gelatin or soup. Progress to regular foods as tolerated. Avoid greasy, spicy, heavy foods. If nausea and/or vomiting occur, drink only clear liquids until the nausea and/or vomiting subsides. Call your physician if vomiting continues.  Special Instructions/Symptoms: Your throat may feel dry or sore from the anesthesia or the breathing tube placed in your throat during surgery. If this causes discomfort, gargle with warm salt water. The discomfort should disappear within 24 hours.  If you had a scopolamine patch placed behind your ear for the management of post- operative nausea and/or vomiting:  1. The medication in the patch is effective for 72 hours, after which it should be removed.  Wrap patch in a tissue and discard in the trash. Wash hands thoroughly with soap and water. 2. You may remove the patch earlier than 72 hours if you experience unpleasant side effects which may include dry mouth, dizziness or visual disturbances. 3. Avoid touching the patch. Wash your hands with soap and water after contact with the patch.

## 2015-05-03 NOTE — Anesthesia Preprocedure Evaluation (Signed)
Anesthesia Evaluation  Patient identified by MRN, date of birth, ID band Patient awake    Reviewed: Allergy & Precautions, NPO status , Patient's Chart, lab work & pertinent test results  Airway Mallampati: II  TM Distance: >3 FB Neck ROM: Full    Dental no notable dental hx.    Pulmonary neg pulmonary ROS, former smoker,    Pulmonary exam normal breath sounds clear to auscultation       Cardiovascular negative cardio ROS Normal cardiovascular exam Rhythm:Regular Rate:Normal     Neuro/Psych negative neurological ROS  negative psych ROS   GI/Hepatic Neg liver ROS, GERD  Medicated,  Endo/Other  negative endocrine ROS  Renal/GU negative Renal ROS  negative genitourinary   Musculoskeletal negative musculoskeletal ROS (+)   Abdominal   Peds negative pediatric ROS (+)  Hematology negative hematology ROS (+)   Anesthesia Other Findings   Reproductive/Obstetrics negative OB ROS                             Anesthesia Physical Anesthesia Plan  ASA: II  Anesthesia Plan: General   Post-op Pain Management: GA combined w/ Regional for post-op pain   Induction: Intravenous  Airway Management Planned: LMA  Additional Equipment:   Intra-op Plan:   Post-operative Plan: Extubation in OR  Informed Consent: I have reviewed the patients History and Physical, chart, labs and discussed the procedure including the risks, benefits and alternatives for the proposed anesthesia with the patient or authorized representative who has indicated his/her understanding and acceptance.   Dental advisory given  Plan Discussed with: CRNA and Surgeon  Anesthesia Plan Comments:         Anesthesia Quick Evaluation

## 2015-05-03 NOTE — Anesthesia Postprocedure Evaluation (Signed)
  Anesthesia Post-op Note  Patient: Stacey Navarro  Procedure(s) Performed: Procedure(s) (LRB): RIGHT UNICOMPARTMENTAL KNEE (Right)  Patient Location: PACU  Anesthesia Type: GA combined with regional for post-op pain  Level of Consciousness: awake and alert   Airway and Oxygen Therapy: Patient Spontanous Breathing  Post-op Pain: mild  Post-op Assessment: Post-op Vital signs reviewed, Patient's Cardiovascular Status Stable, Respiratory Function Stable, Patent Airway and No signs of Nausea or vomiting  Last Vitals:  Filed Vitals:   05/03/15 1245  BP: 147/61  Pulse: 66  Temp:   Resp: 16    Post-op Vital Signs: stable   Complications: No apparent anesthesia complications

## 2015-05-03 NOTE — H&P (Signed)
PREOPERATIVE H&P  Chief Complaint: unilateral primary osteoarthritis right knee M17.11  HPI: Stacey Navarro is a 74 y.o. female who presents for preoperative history and physical with a diagnosis of unilateral primary osteoarthritis right knee M17.11. Symptoms are rated as moderate to severe, and have been worsening.  This is significantly impairing activities of daily living.  She has elected for surgical management.   She has failed injections, activity modification, anti-inflammatories, and assistive devices. She's had a total of 4 injections and the pain is located medially worse over the past 2 months, she's had x-rays as well as an MRI and use Voltaren.  Preoperative X-rays demonstrate end stage degenerative changes with osteophyte formation, loss of joint space, subchondral sclerosis.   Past Medical History  Diagnosis Date  . GERD (gastroesophageal reflux disease)   . Hyperlipidemia   . Arthritis 2010    OA in knees  . Breast cancer of upper-inner quadrant of right female breast 10/23/2014  . Carpal tunnel syndrome 02/12/2015    Right  . S/P radiation therapy 12/26/2014 through 01/29/2015     Right breast 4800 cGy in 24 sessions    Past Surgical History  Procedure Laterality Date  . Cholecystectomy  2011  . Tonsillectomy  1948  . Tubal ligation    . Dilation and curettage of uterus    . Radioactive seed guided mastectomy with axillary sentinel lymph node biopsy Right 11/13/2014    Procedure: RIGHT PARTIAL MASTECTOMY WITH RADIOACTIVE SEED LOCALIZATION RIGHT  AXILLARY SENTINEL LYMPH NODE BIOPSY;  Surgeon: [name], MD;  Location: [facility];  Service: General;  Laterality: Right;   Social History   Social History  . Marital Status: Widowed    Spouse Name: [name]  . Number of Children: [name]  . Years of Education: [name]   Social History Main Topics  . Smoking status: Former Smoker --  1.00 packs/day    Types: Cigarettes    Quit date: 11/06/1995  . Smokeless tobacco: Never Used  . Alcohol Use: 1.2 oz/week    2 Glasses of wine per week     Comment: occ  . Drug Use: No  . Sexual Activity: Not Currently    Birth Control/ Protection: Post-menopausal   Other Topics Concern  . None   Social History Narrative   Family History  Problem Relation Age of Onset  . Hypertension Mother   . Alcohol abuse Father   . Breast cancer Maternal Aunt    No Known Allergies Prior to Admission medications   Medication Sig Start Date End Date Taking? Authorizing Provider  anastrozole (ARIMIDEX) 1 MG tablet Take 1 tablet (1 mg total) by mouth daily. 02/14/15  Yes [name], MD  calcium carbonate (OS-CAL) 1250 (500 CA) MG chewable tablet Chew 1 tablet by mouth daily.   Yes Historical Provider, MD  cholecalciferol (VITAMIN D) 1000 UNITS tablet Take 1,000 Units by mouth daily.   Yes Historical Provider, MD  Glucosamine-Chondroit-Vit C-Mn (GLUCOSAMINE 1500 COMPLEX PO) Take by mouth.   Yes Historical Provider, MD  meloxicam (MOBIC) 15 MG tablet Take 15 mg by mouth daily. 12/27/14  Yes Historical Provider, MD  Omega-3 Fatty Acids (FISH OIL) 1000 MG CPDR Take by mouth.   Yes Historical Provider, MD  pantoprazole (PROTONIX) 40 MG tablet TAKE 1 TABLET (40 MG TOTAL) BY MOUTH DAILY. 10/03/14  Yes [name], MD  vitamin E 400 UNIT capsule Take 400 Units by mouth daily.   Yes Historical Provider, MD  Positive ROS: All other systems have been reviewed and were otherwise negative with the exception of those mentioned in the HPI and as above.  Physical Exam: General: Alert, no acute distress Cardiovascular: No pedal edema Respiratory: No cyanosis, no use of accessory musculature GI: No organomegaly, abdomen is soft and non-tender Skin: No lesions in the area of chief complaint Neurologic: Sensation intact distally Psychiatric: Patient is competent for consent with normal mood and  affect Lymphatic: No axillary or cervical lymphadenopathy  MUSCULOSKELETAL: Right knee has range of motion 0-130 with positive crepitance medially and pseudo-laxity with pain to palpation over the medial joint line.  Assessment: unilateral primary osteoarthritis right knee M17.11   Plan: Plan for Procedure(s): RIGHT UNICOMPARTMENTAL KNEE  The risks benefits and alternatives were discussed with the patient including but not limited to the risks of nonoperative treatment, versus surgical intervention including infection, bleeding, nerve injury,  blood clots, cardiopulmonary complications, morbidity, mortality, among others, and they were willing to proceed. We have also discussed the possibility for hardware failure, need for revision surgery, among others.  Johnny Bridge, MD Cell [phone]   05/03/2015 7:27 AM

## 2015-05-06 ENCOUNTER — Encounter (HOSPITAL_BASED_OUTPATIENT_CLINIC_OR_DEPARTMENT_OTHER): Payer: Self-pay | Admitting: Orthopedic Surgery

## 2015-05-13 ENCOUNTER — Telehealth: Payer: Self-pay | Admitting: Hematology and Oncology

## 2015-05-13 NOTE — Telephone Encounter (Signed)
Patient called in to reschedule her appointment as she has had knee surg.

## 2015-05-16 ENCOUNTER — Other Ambulatory Visit: Payer: Self-pay | Admitting: *Deleted

## 2015-05-16 ENCOUNTER — Ambulatory Visit: Payer: Medicare Other | Admitting: Hematology and Oncology

## 2015-05-16 DIAGNOSIS — C50211 Malignant neoplasm of upper-inner quadrant of right female breast: Secondary | ICD-10-CM

## 2015-05-17 ENCOUNTER — Encounter: Payer: Self-pay | Admitting: Nurse Practitioner

## 2015-05-17 DIAGNOSIS — C50211 Malignant neoplasm of upper-inner quadrant of right female breast: Secondary | ICD-10-CM

## 2015-05-17 NOTE — Progress Notes (Signed)
The Survivorship Care Plan was mailed to Stacey Navarro as she reported not being able to come in to the Survivorship Clinic for an in-person visit at this time. A letter was mailed to her outlining the purpose of the content of the care plan, as well as encouraging her to reach out to me with any questions or concerns.  My business card was included in the correspondence to the patient as well.  A copy of the care plan was also routed/faxed/mailed to Stacey Calico, MD, the patient's PCP.  I will not be placing any follow-up appointments to the Survivorship Clinic for Stacey Navarro, but I am happy to see her at any time in the future for any survivorship concerns that may arise. Thank you for allowing me to participate in her care!  Kenn File, [place] [phone]

## 2015-05-31 ENCOUNTER — Telehealth: Payer: Self-pay | Admitting: Hematology and Oncology

## 2015-05-31 ENCOUNTER — Ambulatory Visit: Payer: Medicare Other | Admitting: Hematology and Oncology

## 2015-05-31 NOTE — Telephone Encounter (Signed)
pt called to r/s appt...done....pt ok and aware of new d.t

## 2015-05-31 NOTE — Assessment & Plan Note (Signed)
Rt Lumpectomy 11/14/14: IDC Grade 3, 2.7 cm size, 0/3 LN , Margins clear; T2N0 Stage IIA Right breast invasive ductal carcinoma diagnosed with screening mammogram, 3.1 cm by ultrasound in the upper quadrant 6 cm from the nipple, grade 2-3, EF 100%, PR 48%, HER-2 negative ratio 0.6, Ki-67 was 28%, Oncotype DX score 19, 12% risk of recurrence, status post radiation 4800 cGy in 24 sessions completed 01/29/2015  Current Treatment:  anastrozole 1 mg daily started 02/19/15 Anastrozole Toxicities:  RTC 6 months

## 2015-06-07 ENCOUNTER — Telehealth: Payer: Self-pay | Admitting: Hematology and Oncology

## 2015-06-07 ENCOUNTER — Encounter: Payer: Self-pay | Admitting: Hematology and Oncology

## 2015-06-07 ENCOUNTER — Ambulatory Visit (HOSPITAL_BASED_OUTPATIENT_CLINIC_OR_DEPARTMENT_OTHER): Payer: Medicare Other | Admitting: Hematology and Oncology

## 2015-06-07 VITALS — BP 126/70 | HR 86 | Temp 98.0°F | Resp 18 | Ht 66.0 in | Wt 210.2 lb

## 2015-06-07 DIAGNOSIS — C50211 Malignant neoplasm of upper-inner quadrant of right female breast: Secondary | ICD-10-CM

## 2015-06-07 DIAGNOSIS — Z79811 Long term (current) use of aromatase inhibitors: Secondary | ICD-10-CM

## 2015-06-07 DIAGNOSIS — Z170 Estrogen receptor positive status [ER+]: Secondary | ICD-10-CM | POA: Diagnosis not present

## 2015-06-07 NOTE — Telephone Encounter (Signed)
Appointments made and avs printed for patient °

## 2015-06-07 NOTE — Assessment & Plan Note (Signed)
Rt Lumpectomy 11/14/14: IDC Grade 3, 2.7 cm size, 0/3 LN , Margins clear; T2N0 Stage IIA Right breast invasive ductal carcinoma diagnosed with screening mammogram, 3.1 cm by ultrasound in the upper quadrant 6 cm from the nipple, grade 2-3, EF 100%, PR 48%, HER-2 negative ratio 0.6, Ki-67 was 28%, Oncotype DX score 19, 12% risk of recurrence, status post radiation 4800 cGy in 24 sessions completed 01/29/2015  Treatment plan: antiestrogen therapy with anastrozole 1 mg daily started 02/14/2015  Anastrozole toxicities:  Breast Cancer Surveillance: 1. Breast exam 06/07/2015: Normal 2. Mammograms to be done in March 2017  Return to clinic in 6 months for follow-up

## 2015-06-07 NOTE — Progress Notes (Signed)
Patient Care Team: [name], MD as PCP - General (Internal Medicine) [name], MD as Consulting Physician (General Surgery) [name], MD as Consulting Physician (Hematology and Oncology) [name], MD as Consulting Physician (Radiation Oncology) Mauro Kaufmann, RN as Registered Nurse Rockwell Germany, RN as Registered Nurse Holley Bouche, NP as Nurse Practitioner (Nurse Practitioner) Marchia Bond, MD as Consulting Physician (Orthopedic Surgery) Sylvan Cheese, NP as Nurse Practitioner (Hematology and Oncology)  DIAGNOSIS: Breast cancer of upper-inner quadrant of right female breast [facility])   Staging form: Breast, AJCC 7th Edition     Clinical stage from 10/31/2014: Stage IIA (T2, N0, M0) - Unsigned     Pathologic stage from 11/15/2014: Stage IIA (T2, N0, cM0) - Signed by Enid Cutter, MD on 11/26/2014       Staging comments: Staged on final lumpectomy specimen by Dr. Donato Heinz.    SUMMARY OF ONCOLOGIC HISTORY:   Breast cancer of upper-inner quadrant of right female breast ([place])   10/19/2014 Mammogram Right breast mass 3.1 cm upper quadrant, 6 cm from the nipple   10/19/2014 Initial Biopsy Right breast core needle bx: invasive ductal carcinoma, grade 2-3, ER+ (100%), PR+ (48%), HER-2 negative (ratio 0.6), Ki-67 28%   10/20/2014 Clinical Stage Stage IIA: T2 N0   11/13/2014 Definitive Surgery Right Lumpectomy/SLNB Dalbert Batman): IDC, grade 3, 2.7 cm size, 3 LN removed and negative for malignancy (0/3) margins clear. HER2/neu repeated and remains negative (ratio 0.88).   11/13/2014 Pathologic Stage Stage IIA: pT2 pN0   11/13/2014 Oncotype testing Score 19 (12% ROR). No chemotherapy [facility]).   12/26/2014 - 01/29/2015 Radiation Therapy Adjuvant RT Valere Dross): Right breast 48 Gy over 24 sessions   02/14/2015 -  Anti-estrogen oral therapy Anastrozole 1 mg daily. Planned duration of therapy 5 years [place])    Survivorship Survivorship care plan completed and mailed to patient in lieu of  inperson visit.    CHIEF COMPLIANT: F/U on anastrozole  INTERVAL HISTORY: Stacey Navarro is a 74 yr old with  H/O Rt Breast cancer S/P XRT and now on anastrozole. Shes tolerating it very well. She doesnot have any side effects.  REVIEW OF SYSTEMS:   Constitutional: Denies fevers, chills or abnormal weight loss Eyes: Denies blurriness of vision Ears, nose, mouth, throat, and face: Denies mucositis or sore throat Respiratory: Denies cough, dyspnea or wheezes Cardiovascular: Denies palpitation, chest discomfort or lower extremity swelling Gastrointestinal:  Denies nausea, heartburn or change in bowel habits Skin: Denies abnormal skin rashes Lymphatics: Denies new lymphadenopathy or easy bruising Neurological:Denies numbness, tingling or new weaknesses Behavioral/Psych: Mood is stable, no new changes  Breast:  denies any pain or lumps or nodules in either breasts All other systems were reviewed with the patient and are negative.  I have reviewed the past medical history, past surgical history, social history and family history with the patient and they are unchanged from previous note.  ALLERGIES:  has No Known Allergies.  MEDICATIONS:  Current Outpatient Prescriptions  Medication Sig Dispense Refill  . anastrozole (ARIMIDEX) 1 MG tablet Take 1 tablet (1 mg total) by mouth daily. 90 tablet 3  . baclofen (LIORESAL) 10 MG tablet Take 1 tablet (10 mg total) by mouth 3 (three) times daily. As needed for muscle spasm 50 tablet 0  . calcium carbonate (OS-CAL) 1250 (500 CA) MG chewable tablet Chew 1 tablet by mouth daily.    . cholecalciferol (VITAMIN D) 1000 UNITS tablet Take 1,000 Units by mouth daily.    Marland Kitchen  Glucosamine-Chondroit-Vit C-Mn (GLUCOSAMINE 1500 COMPLEX PO) Take by mouth.    . Omega-3 Fatty Acids (FISH OIL) 1000 MG CPDR Take by mouth.    . ondansetron (ZOFRAN) 4 MG tablet Take 1 tablet (4 mg total) by mouth every 8 (eight) hours as needed for nausea or vomiting. 30 tablet 0  .  oxyCODONE-acetaminophen (PERCOCET) 10-325 MG tablet Take 1-2 tablets by mouth every 6 (six) hours as needed for pain. MAXIMUM TOTAL ACETAMINOPHEN DOSE IS 4000 MG PER DAY 50 tablet 0  . pantoprazole (PROTONIX) 40 MG tablet TAKE 1 TABLET (40 MG TOTAL) BY MOUTH DAILY. 30 tablet 11  . rivaroxaban (XARELTO) 10 MG TABS tablet Take 1 tablet (10 mg total) by mouth daily. 21 tablet 0  . sennosides-docusate sodium (SENOKOT-S) 8.6-50 MG tablet Take 2 tablets by mouth daily. 30 tablet 1  . vitamin E 400 UNIT capsule Take 400 Units by mouth daily.     No current facility-administered medications for this visit.    PHYSICAL EXAMINATION: ECOG PERFORMANCE STATUS: 0 - Asymptomatic  Filed Vitals:   06/07/15 1230  BP: 126/70  Pulse: 86  Temp: 98 F (36.7 C)  Resp: 18   Filed Weights   06/07/15 1230  Weight: 210 lb 3.2 oz (95.346 kg)    GENERAL:alert, no distress and comfortable SKIN: skin color, texture, turgor are normal, no rashes or significant lesions EYES: normal, Conjunctiva are pink and non-injected, sclera clear OROPHARYNX:no exudate, no erythema and lips, buccal mucosa, and tongue normal  NECK: supple, thyroid normal size, non-tender, without nodularity LYMPH:  no palpable lymphadenopathy in the cervical, axillary or inguinal LUNGS: clear to auscultation and percussion with normal breathing effort HEART: regular rate & rhythm and no murmurs and no lower extremity edema ABDOMEN:abdomen soft, non-tender and normal bowel sounds Musculoskeletal:no cyanosis of digits and no clubbing  NEURO: alert & oriented x 3 with fluent speech, no focal motor/sensory deficits  LABORATORY DATA:  I have reviewed the data as listed   Chemistry      Component Value Date/Time   NA 138 05/03/2015 0910   NA 142 10/31/2014 0818   K 4.7 05/03/2015 0910   K 4.6 10/31/2014 0818   CL 110 05/03/2015 0910   CO2 24 10/31/2014 0818   CO2 27 10/03/2014 1055   BUN 29* 05/03/2015 0910   BUN 18.9 10/31/2014 0818    CREATININE 0.80 05/03/2015 0910   CREATININE 1.0 10/31/2014 0818      Component Value Date/Time   CALCIUM 9.4 10/31/2014 0818   CALCIUM 9.9 10/03/2014 1055   ALKPHOS 112 10/31/2014 0818   ALKPHOS 108 10/03/2014 1055   AST 33 10/31/2014 0818   AST 33 10/03/2014 1055   ALT 48 10/31/2014 0818   ALT 48* 10/03/2014 1055   BILITOT 0.41 10/31/2014 0818   BILITOT 0.7 10/03/2014 1055       Lab Results  Component Value Date   WBC 5.8 05/03/2015   HGB 15.0 05/03/2015   HCT 44.0 05/03/2015   MCV 98.6 05/03/2015   PLT 220 05/03/2015   NEUTROABS 4.2 10/31/2014   ASSESSMENT & PLAN:  Breast cancer of upper-inner quadrant of right female breast Rt Lumpectomy 11/14/14: IDC Grade 3, 2.7 cm size, 0/3 LN , Margins clear; T2N0 Stage IIA Right breast invasive ductal carcinoma diagnosed with screening mammogram, 3.1 cm by ultrasound in the upper quadrant 6 cm from the nipple, grade 2-3, EF 100%, PR 48%, HER-2 negative ratio 0.6, Ki-67 was 28%, Oncotype DX score 19, 12% risk of  recurrence, status post radiation 4800 cGy in 24 sessions completed 01/29/2015  Treatment plan: antiestrogen therapy with anastrozole 1 mg daily started 02/14/2015  Anastrozole toxicities: Tolerating it very well without any side effects  Breast Cancer Surveillance: 1. Breast exam 06/07/2015: Normal 2. Mammograms to be done in March 2017  Return to clinic in 6 months for follow-up   No orders of the defined types were placed in this encounter.   The patient has a good understanding of the overall plan. she agrees with it. she will call with any problems that may develop before the next visit here.   Rulon Eisenmenger, MD 06/07/2015

## 2015-07-01 ENCOUNTER — Encounter (HOSPITAL_BASED_OUTPATIENT_CLINIC_OR_DEPARTMENT_OTHER): Payer: Self-pay | Admitting: *Deleted

## 2015-07-02 ENCOUNTER — Encounter (HOSPITAL_BASED_OUTPATIENT_CLINIC_OR_DEPARTMENT_OTHER)
Admission: RE | Admit: 2015-07-02 | Discharge: 2015-07-02 | Disposition: A | Discharge status: Home / Self Care | Payer: Medicare Other | Source: Ambulatory Visit | Attending: Orthopedic Surgery | Admitting: Orthopedic Surgery

## 2015-07-02 DIAGNOSIS — Z01818 Encounter for other preprocedural examination: Secondary | ICD-10-CM | POA: Insufficient documentation

## 2015-07-02 DIAGNOSIS — M1712 Unilateral primary osteoarthritis, left knee: Secondary | ICD-10-CM | POA: Insufficient documentation

## 2015-07-02 LAB — SURGICAL PCR SCREEN
MRSA, PCR: NEGATIVE
STAPHYLOCOCCUS AUREUS: NEGATIVE

## 2015-07-05 ENCOUNTER — Other Ambulatory Visit: Payer: Self-pay | Admitting: Orthopedic Surgery

## 2015-07-12 ENCOUNTER — Ambulatory Visit (HOSPITAL_BASED_OUTPATIENT_CLINIC_OR_DEPARTMENT_OTHER)
Admission: RE | Admit: 2015-07-12 | Discharge: 2015-07-12 | Disposition: A | Discharge status: Home / Self Care | Payer: Medicare Other | Source: Ambulatory Visit | Attending: Orthopedic Surgery | Admitting: Orthopedic Surgery

## 2015-07-12 ENCOUNTER — Ambulatory Visit (HOSPITAL_COMMUNITY): Payer: Medicare Other

## 2015-07-12 ENCOUNTER — Ambulatory Visit (HOSPITAL_BASED_OUTPATIENT_CLINIC_OR_DEPARTMENT_OTHER): Payer: Medicare Other | Admitting: Anesthesiology

## 2015-07-12 ENCOUNTER — Encounter (HOSPITAL_BASED_OUTPATIENT_CLINIC_OR_DEPARTMENT_OTHER)
Admission: RE | Disposition: A | Discharge status: Home / Self Care | Payer: Self-pay | Source: Ambulatory Visit | Attending: Orthopedic Surgery

## 2015-07-12 ENCOUNTER — Encounter: Payer: Medicare Other | Admitting: Nurse Practitioner

## 2015-07-12 ENCOUNTER — Encounter (HOSPITAL_BASED_OUTPATIENT_CLINIC_OR_DEPARTMENT_OTHER): Payer: Self-pay

## 2015-07-12 DIAGNOSIS — Z96652 Presence of left artificial knee joint: Secondary | ICD-10-CM

## 2015-07-12 DIAGNOSIS — Z87891 Personal history of nicotine dependence: Secondary | ICD-10-CM | POA: Insufficient documentation

## 2015-07-12 DIAGNOSIS — M1712 Unilateral primary osteoarthritis, left knee: Secondary | ICD-10-CM | POA: Diagnosis present

## 2015-07-12 DIAGNOSIS — K219 Gastro-esophageal reflux disease without esophagitis: Secondary | ICD-10-CM | POA: Insufficient documentation

## 2015-07-12 HISTORY — DX: Unilateral primary osteoarthritis, left knee: M17.12

## 2015-07-12 HISTORY — PX: PARTIAL KNEE ARTHROPLASTY: SHX2174

## 2015-07-12 SURGERY — ARTHROPLASTY, KNEE, UNICOMPARTMENTAL
Anesthesia: Regional | Site: Knee | Laterality: Left

## 2015-07-12 MED ORDER — ONDANSETRON HCL 4 MG PO TABS: 4.0000 mg | ORAL_TABLET | Freq: Three times a day (TID) | ORAL | Status: DC | PRN

## 2015-07-12 MED ORDER — VITAMIN D3 25 MCG (1000 UNIT) PO TABS
1000.0000 [IU] | ORAL_TABLET | Freq: Every day | ORAL | Status: DC
Start: 2015-07-12 — End: 2015-07-12

## 2015-07-12 MED ORDER — DOCUSATE SODIUM 100 MG PO CAPS: 100.0000 mg | ORAL_CAPSULE | Freq: Two times a day (BID) | ORAL | Status: DC

## 2015-07-12 MED ORDER — HYDROMORPHONE HCL 1 MG/ML IJ SOLN: 0.5000 mg | INTRAMUSCULAR | Status: DC | PRN

## 2015-07-12 MED ORDER — METOCLOPRAMIDE HCL 5 MG/ML IJ SOLN: 5.0000 mg | Freq: Three times a day (TID) | INTRAMUSCULAR | Status: DC | PRN

## 2015-07-12 MED ORDER — ANASTROZOLE 1 MG PO TABS: 1.0000 mg | ORAL_TABLET | Freq: Every day | ORAL | Status: DC

## 2015-07-12 MED ORDER — CALCIUM CARBONATE 1250 (500 CA) MG PO CHEW: 1250.0000 mg | CHEWABLE_TABLET | Freq: Every day | ORAL | Status: DC

## 2015-07-12 MED ORDER — ONDANSETRON HCL 4 MG PO TABS: 4.0000 mg | ORAL_TABLET | Freq: Four times a day (QID) | ORAL | Status: DC | PRN

## 2015-07-12 MED ORDER — MAGNESIUM CITRATE PO SOLN: 1.0000 | Freq: Once | ORAL | Status: DC | PRN

## 2015-07-12 MED ORDER — RIVAROXABAN 10 MG PO TABS: 10.0000 mg | ORAL_TABLET | Freq: Every day | ORAL | Status: DC

## 2015-07-12 MED ORDER — PROPOFOL 10 MG/ML IV BOLUS
INTRAVENOUS | Status: DC | PRN
  Administered 2015-07-12 (×2): 50 mg via INTRAVENOUS
  Administered 2015-07-12: 150 mg via INTRAVENOUS
  Administered 2015-07-12 (×2): 50 mg via INTRAVENOUS

## 2015-07-12 MED ORDER — FENTANYL CITRATE (PF) 100 MCG/2ML IJ SOLN
INTRAMUSCULAR | Status: AC
  Filled 2015-07-12: qty 2

## 2015-07-12 MED ORDER — HYDROMORPHONE HCL 1 MG/ML IJ SOLN: 0.2500 mg | INTRAMUSCULAR | Status: DC | PRN

## 2015-07-12 MED ORDER — PHENYLEPHRINE 40 MCG/ML (10ML) SYRINGE FOR IV PUSH (FOR BLOOD PRESSURE SUPPORT)
PREFILLED_SYRINGE | INTRAVENOUS | Status: AC
  Filled 2015-07-12: qty 10

## 2015-07-12 MED ORDER — DEXAMETHASONE SODIUM PHOSPHATE 10 MG/ML IJ SOLN
INTRAMUSCULAR | Status: AC
  Filled 2015-07-12: qty 1

## 2015-07-12 MED ORDER — METHOCARBAMOL 500 MG PO TABS: 500.0000 mg | ORAL_TABLET | Freq: Four times a day (QID) | ORAL | Status: DC | PRN

## 2015-07-12 MED ORDER — SCOPOLAMINE 1 MG/3DAYS TD PT72
MEDICATED_PATCH | TRANSDERMAL | Status: AC
  Filled 2015-07-12: qty 1

## 2015-07-12 MED ORDER — OXYCODONE-ACETAMINOPHEN 5-325 MG PO TABS: 1.0000 | ORAL_TABLET | ORAL | Status: DC | PRN

## 2015-07-12 MED ORDER — PANTOPRAZOLE SODIUM 40 MG PO TBEC: 40.0000 mg | DELAYED_RELEASE_TABLET | Freq: Every day | ORAL | Status: DC

## 2015-07-12 MED ORDER — BISACODYL 10 MG RE SUPP: 10.0000 mg | Freq: Every day | RECTAL | Status: DC | PRN

## 2015-07-12 MED ORDER — OXYCODONE-ACETAMINOPHEN 5-325 MG PO TABS: 1.0000 | ORAL_TABLET | Freq: Four times a day (QID) | ORAL | Status: DC | PRN

## 2015-07-12 MED ORDER — SODIUM CHLORIDE 0.9 % IV SOLN: INTRAVENOUS | Status: DC

## 2015-07-12 MED ORDER — ZOLPIDEM TARTRATE 5 MG PO TABS: 5.0000 mg | ORAL_TABLET | Freq: Every evening | ORAL | Status: DC | PRN

## 2015-07-12 MED ORDER — LACTATED RINGERS IV SOLN
INTRAVENOUS | Status: DC
  Administered 2015-07-12 (×2): via INTRAVENOUS

## 2015-07-12 MED ORDER — HYDROCODONE-ACETAMINOPHEN 7.5-325 MG PO TABS: 1.0000 | ORAL_TABLET | Freq: Once | ORAL | Status: DC | PRN

## 2015-07-12 MED ORDER — ONDANSETRON HCL 4 MG/2ML IJ SOLN: 4.0000 mg | Freq: Four times a day (QID) | INTRAMUSCULAR | Status: DC | PRN

## 2015-07-12 MED ORDER — METOCLOPRAMIDE HCL 5 MG PO TABS: 5.0000 mg | ORAL_TABLET | Freq: Three times a day (TID) | ORAL | Status: DC | PRN

## 2015-07-12 MED ORDER — FENTANYL CITRATE (PF) 100 MCG/2ML IJ SOLN
50.0000 ug | INTRAMUSCULAR | Status: DC | PRN
  Administered 2015-07-12: 50 ug via INTRAVENOUS

## 2015-07-12 MED ORDER — SENNA-DOCUSATE SODIUM 8.6-50 MG PO TABS: 2.0000 | ORAL_TABLET | Freq: Every day | ORAL | Status: DC

## 2015-07-12 MED ORDER — PROPOFOL 500 MG/50ML IV EMUL
INTRAVENOUS | Status: AC
  Filled 2015-07-12: qty 50

## 2015-07-12 MED ORDER — FENTANYL CITRATE (PF) 100 MCG/2ML IJ SOLN
INTRAMUSCULAR | Status: DC | PRN
  Administered 2015-07-12: 50 ug via INTRAVENOUS

## 2015-07-12 MED ORDER — POLYETHYLENE GLYCOL 3350 17 G PO PACK: 17.0000 g | PACK | Freq: Every day | ORAL | Status: DC | PRN

## 2015-07-12 MED ORDER — BUPIVACAINE-EPINEPHRINE (PF) 0.5% -1:200000 IJ SOLN
INTRAMUSCULAR | Status: DC | PRN
  Administered 2015-07-12: 30 mL via PERINEURAL

## 2015-07-12 MED ORDER — OXYCODONE HCL 5 MG PO TABS: 5.0000 mg | ORAL_TABLET | ORAL | Status: DC | PRN

## 2015-07-12 MED ORDER — PROPOFOL 500 MG/50ML IV EMUL
INTRAVENOUS | Status: DC | PRN
  Administered 2015-07-12: 25 ug/kg/min via INTRAVENOUS

## 2015-07-12 MED ORDER — SENNA 8.6 MG PO TABS: 1.0000 | ORAL_TABLET | Freq: Two times a day (BID) | ORAL | Status: DC

## 2015-07-12 MED ORDER — PHENYLEPHRINE HCL 10 MG/ML IJ SOLN
INTRAMUSCULAR | Status: DC | PRN
  Administered 2015-07-12: 40 ug via INTRAVENOUS

## 2015-07-12 MED ORDER — DEXAMETHASONE SODIUM PHOSPHATE 4 MG/ML IJ SOLN
INTRAMUSCULAR | Status: DC | PRN
  Administered 2015-07-12: 10 mg via INTRAVENOUS

## 2015-07-12 MED ORDER — BACLOFEN 10 MG PO TABS: 10.0000 mg | ORAL_TABLET | Freq: Three times a day (TID) | ORAL | Status: DC

## 2015-07-12 MED ORDER — MIDAZOLAM HCL 2 MG/2ML IJ SOLN
INTRAMUSCULAR | Status: AC
  Filled 2015-07-12: qty 2

## 2015-07-12 MED ORDER — METHOCARBAMOL 1000 MG/10ML IJ SOLN: 500.0000 mg | Freq: Four times a day (QID) | INTRAVENOUS | Status: DC | PRN

## 2015-07-12 MED ORDER — GLYCOPYRROLATE 0.2 MG/ML IJ SOLN: 0.2000 mg | Freq: Once | INTRAMUSCULAR | Status: DC | PRN

## 2015-07-12 MED ORDER — ONDANSETRON HCL 4 MG/2ML IJ SOLN
INTRAMUSCULAR | Status: AC
  Filled 2015-07-12: qty 2

## 2015-07-12 MED ORDER — SCOPOLAMINE 1 MG/3DAYS TD PT72: 1.0000 | MEDICATED_PATCH | Freq: Once | TRANSDERMAL | Status: DC

## 2015-07-12 MED ORDER — VITAMIN E 180 MG (400 UNIT) PO CAPS
400.0000 [IU] | ORAL_CAPSULE | Freq: Every day | ORAL | Status: DC
Start: 2015-07-12 — End: 2015-07-12

## 2015-07-12 MED ORDER — PHENYLEPHRINE HCL 10 MG/ML IJ SOLN
INTRAMUSCULAR | Status: AC
  Filled 2015-07-12: qty 1

## 2015-07-12 MED ORDER — CEFAZOLIN SODIUM 1-5 GM-% IV SOLN: 1.0000 g | Freq: Four times a day (QID) | INTRAVENOUS | Status: DC

## 2015-07-12 MED ORDER — CEFAZOLIN SODIUM-DEXTROSE 2-3 GM-% IV SOLR
2.0000 g | INTRAVENOUS | Status: AC
  Administered 2015-07-12: 2 g via INTRAVENOUS

## 2015-07-12 MED ORDER — PROMETHAZINE HCL 25 MG/ML IJ SOLN
6.2500 mg | INTRAMUSCULAR | Status: DC | PRN
Start: 2015-07-12 — End: 2015-07-12

## 2015-07-12 MED ORDER — MIDAZOLAM HCL 2 MG/2ML IJ SOLN
1.0000 mg | INTRAMUSCULAR | Status: DC | PRN
  Administered 2015-07-12: 1 mg via INTRAVENOUS

## 2015-07-12 MED ORDER — ONDANSETRON HCL 4 MG/2ML IJ SOLN
INTRAMUSCULAR | Status: DC | PRN
  Administered 2015-07-12: 4 mg via INTRAVENOUS

## 2015-07-12 MED ORDER — LIDOCAINE HCL (CARDIAC) 20 MG/ML IV SOLN
INTRAVENOUS | Status: AC
  Filled 2015-07-12: qty 5

## 2015-07-12 MED ORDER — PROPOFOL 10 MG/ML IV BOLUS
INTRAVENOUS | Status: AC
Start: 2015-07-12 — End: 2015-07-12
  Filled 2015-07-12: qty 40

## 2015-07-12 MED ORDER — SODIUM CHLORIDE 0.9 % IV SOLN
INTRAVENOUS | Status: DC | PRN
  Administered 2015-07-12: 1000 mL

## 2015-07-12 SURGICAL SUPPLY — 64 items
BANDAGE ELASTIC 6 VELCRO ST LF (GAUZE/BANDAGES/DRESSINGS) ×2 IMPLANT
BANDAGE ESMARK 6X9 LF (GAUZE/BANDAGES/DRESSINGS) ×1 IMPLANT
BLADE SURG 10 STRL SS (BLADE) ×2 IMPLANT
BLADE SURG 15 STRL LF DISP TIS (BLADE) ×2 IMPLANT
BLADE SURG 15 STRL SS (BLADE) ×4
BNDG CMPR 9X6 STRL LF SNTH (GAUZE/BANDAGES/DRESSINGS) ×1
BNDG ESMARK 6X9 LF (GAUZE/BANDAGES/DRESSINGS) ×2
BOWL SMART MIX CTS (DISPOSABLE) ×2 IMPLANT
CANISTER SUCT 1200ML W/VALVE (MISCELLANEOUS) IMPLANT
CANISTER SUCTION 2500CC (MISCELLANEOUS) ×2 IMPLANT
CAPT KNEE PARTIAL 2 ×1 IMPLANT
CEMENT HV SMART SET (Cement) ×2 IMPLANT
CLSR STERI-STRIP ANTIMIC 1/2X4 (GAUZE/BANDAGES/DRESSINGS) ×2 IMPLANT
COVER BACK TABLE 60X90IN (DRAPES) ×2 IMPLANT
CUFF TOURNIQUET SINGLE 34IN LL (TOURNIQUET CUFF) ×1 IMPLANT
DECANTER SPIKE VIAL GLASS SM (MISCELLANEOUS) IMPLANT
DRAPE EXTREMITY T 121X128X90 (DRAPE) ×2 IMPLANT
DRAPE U 20/CS (DRAPES) ×2 IMPLANT
DRAPE U-SHAPE 47X51 STRL (DRAPES) ×2 IMPLANT
DRSG PAD ABDOMINAL 8X10 ST (GAUZE/BANDAGES/DRESSINGS) ×2 IMPLANT
DURAPREP 26ML APPLICATOR (WOUND CARE) ×2 IMPLANT
ELECT REM PT RETURN 9FT ADLT (ELECTROSURGICAL) ×2
ELECTRODE REM PT RTRN 9FT ADLT (ELECTROSURGICAL) ×1 IMPLANT
FACESHIELD WRAPAROUND (MASK) ×4 IMPLANT
FACESHIELD WRAPAROUND OR TEAM (MASK) ×2 IMPLANT
GAUZE SPONGE 4X4 12PLY STRL (GAUZE/BANDAGES/DRESSINGS) ×2 IMPLANT
GLOVE BIO SURGEON STRL SZ8 (GLOVE) ×2 IMPLANT
GLOVE BIOGEL PI IND STRL 7.0 (GLOVE) IMPLANT
GLOVE BIOGEL PI IND STRL 8 (GLOVE) ×2 IMPLANT
GLOVE BIOGEL PI INDICATOR 7.0 (GLOVE) ×2
GLOVE BIOGEL PI INDICATOR 8 (GLOVE) ×2
GLOVE ECLIPSE 6.5 STRL STRAW (GLOVE) ×2 IMPLANT
GLOVE ORTHO TXT STRL SZ7.5 (GLOVE) ×2 IMPLANT
GOWN STRL REUS W/ TWL LRG LVL3 (GOWN DISPOSABLE) ×1 IMPLANT
GOWN STRL REUS W/ TWL XL LVL3 (GOWN DISPOSABLE) ×2 IMPLANT
GOWN STRL REUS W/TWL LRG LVL3 (GOWN DISPOSABLE) ×2
GOWN STRL REUS W/TWL XL LVL3 (GOWN DISPOSABLE) ×6
HANDPIECE INTERPULSE COAX TIP (DISPOSABLE) ×2
IMMOBILIZER KNEE 22 UNIV (SOFTGOODS) ×2 IMPLANT
IMMOBILIZER KNEE 24 THIGH 36 (MISCELLANEOUS) ×1 IMPLANT
IMMOBILIZER KNEE 24 UNIV (MISCELLANEOUS)
KIT SAW BLADE (KITS) ×2 IMPLANT
MANIFOLD NEPTUNE II (INSTRUMENTS) ×2 IMPLANT
NS IRRIG 1000ML POUR BTL (IV SOLUTION) ×2 IMPLANT
PACK ARTHROSCOPY DSU (CUSTOM PROCEDURE TRAY) ×2 IMPLANT
PACK BASIN DAY SURGERY FS (CUSTOM PROCEDURE TRAY) ×2 IMPLANT
PENCIL BUTTON HOLSTER BLD 10FT (ELECTRODE) ×2 IMPLANT
SET HNDPC FAN SPRY TIP SCT (DISPOSABLE) ×1 IMPLANT
SHEET MEDIUM DRAPE 40X70 STRL (DRAPES) ×2 IMPLANT
SLEEVE SCD COMPRESS KNEE MED (MISCELLANEOUS) ×2 IMPLANT
SPONGE LAP 18X18 X RAY DECT (DISPOSABLE) ×2 IMPLANT
SUCTION FRAZIER TIP 10 FR DISP (SUCTIONS) ×2 IMPLANT
SUT MNCRL AB 4-0 PS2 18 (SUTURE) IMPLANT
SUT VIC AB 0 CT1 27 (SUTURE) ×2
SUT VIC AB 0 CT1 27XBRD ANBCTR (SUTURE) ×1 IMPLANT
SUT VIC AB 2-0 SH 27 (SUTURE) ×2
SUT VIC AB 2-0 SH 27XBRD (SUTURE) ×1 IMPLANT
SUT VICRYL 3-0 CR8 SH (SUTURE) ×2 IMPLANT
SUT VICRYL 4-0 PS2 18IN ABS (SUTURE) IMPLANT
SYR BULB IRRIGATION 50ML (SYRINGE) ×2 IMPLANT
TOWEL OR 17X24 6PK STRL BLUE (TOWEL DISPOSABLE) ×2 IMPLANT
TOWEL OR NON WOVEN STRL DISP B (DISPOSABLE) ×4 IMPLANT
TUBE CONNECTING 20X1/4 (TUBING) IMPLANT
YANKAUER SUCT BULB TIP NO VENT (SUCTIONS) ×1 IMPLANT

## 2015-07-12 NOTE — H&P (Signed)
PREOPERATIVE H&P  Chief Complaint: Unilateral primary osteoarthitis  HPI: Stacey Navarro is a 74 y.o. female who presents for preoperative history and physical with a diagnosis of Unilateral primary osteoarthitis. Symptoms are rated as moderate to severe, and have been worsening.  This is significantly impairing activities of daily living.  She has elected for surgical management.   She has failed injections, activity modification, anti-inflammatories, and assistive devices.  Preoperative X-rays demonstrate end stage degenerative changes with osteophyte formation, loss of joint space, subchondral sclerosis.  Did well after contralateral partial knee.   Past Medical History  Diagnosis Date  . GERD (gastroesophageal reflux disease)   . Hyperlipidemia   . Arthritis 2010    OA in knees  . Breast cancer of upper-inner quadrant of right female breast ([name]) 10/23/2014  . Carpal tunnel syndrome 02/12/2015    Right  . S/P radiation therapy 12/26/2014 through 01/29/2015     Right breast 4800 cGy in 24 sessions   . Primary localized osteoarthritis of right knee 05/03/2015   Past Surgical History  Procedure Laterality Date  . Cholecystectomy  2011  . Tonsillectomy  1948  . Tubal ligation    . Dilation and curettage of uterus    . Radioactive seed guided mastectomy with axillary sentinel lymph node biopsy Right 11/13/2014    Procedure: RIGHT PARTIAL MASTECTOMY WITH RADIOACTIVE SEED LOCALIZATION RIGHT  AXILLARY SENTINEL LYMPH NODE BIOPSY;  Surgeon: [name], MD;  Location: [facility];  Service: General;  Laterality: Right;  . Partial knee arthroplasty Right 05/03/2015    Procedure: RIGHT UNICOMPARTMENTAL KNEE;  Surgeon: Marchia Bond, MD;  Location: [facility];  Service: Orthopedics;  Laterality: Right;   Social History   Social History  . Marital Status: Widowed    Spouse Name: [name]   . Number of Children: [name]  . Years of Education: [name]   Social History Main Topics  . Smoking status: Former Smoker -- 1.00 packs/day    Types: Cigarettes    Quit date: 11/06/1995  . Smokeless tobacco: Never Used  . Alcohol Use: 1.2 oz/week    2 Glasses of wine per week     Comment: occ  . Drug Use: No  . Sexual Activity: Not Currently    Birth Control/ Protection: Post-menopausal   Other Topics Concern  . None   Social History Narrative   Family History  Problem Relation Age of Onset  . Hypertension Mother   . Alcohol abuse Father   . Breast cancer Maternal Aunt    No Known Allergies Prior to Admission medications   Medication Sig Start Date End Date Taking? Authorizing Provider  anastrozole (ARIMIDEX) 1 MG tablet Take 1 tablet (1 mg total) by mouth daily. 02/14/15  Yes [name], MD  calcium carbonate (OS-CAL) 1250 (500 CA) MG chewable tablet Chew 1 tablet by mouth daily.   Yes Historical Provider, MD  cholecalciferol (VITAMIN D) 1000 UNITS tablet Take 1,000 Units by mouth daily.   Yes Historical Provider, MD  pantoprazole (PROTONIX) 40 MG tablet TAKE 1 TABLET (40 MG TOTAL) BY MOUTH DAILY. 10/03/14  Yes [name], MD  vitamin E 400 UNIT capsule Take 400 Units by mouth daily.   Yes Historical Provider, MD  Glucosamine-Chondroit-Vit C-Mn (GLUCOSAMINE 1500 COMPLEX PO) Take by mouth.    Historical Provider, MD  Omega-3 Fatty Acids (FISH OIL) 1000 MG CPDR Take by mouth.    Historical Provider, MD     Positive ROS: All other systems have  been reviewed and were otherwise negative with the exception of those mentioned in the HPI and as above.  Physical Exam: General: Alert, no acute distress Cardiovascular: No pedal edema Respiratory: No cyanosis, no use of accessory musculature GI: No organomegaly, abdomen is soft and non-tender Skin: No lesions in the area of chief complaint Neurologic: Sensation intact distally Psychiatric: Patient is competent for consent with  normal mood and affect Lymphatic: No axillary or cervical lymphadenopathy  MUSCULOSKELETAL: AROM left knee 0-125 with medial crepitance and pain.  Mild effusion  Assessment: Unilateral primary osteoarthitis   Plan: Plan for Procedure(s): LEFT UNICOMPARTMENTAL KNEE  The risks benefits and alternatives were discussed with the patient including but not limited to the risks of nonoperative treatment, versus surgical intervention including infection, bleeding, nerve injury,  blood clots, cardiopulmonary complications, morbidity, mortality, among others, and they were willing to proceed.   Johnny Bridge, MD Cell [phone]   07/12/2015 7:21 AM

## 2015-07-12 NOTE — Anesthesia Postprocedure Evaluation (Signed)
Anesthesia Post Note  Patient: Stacey Navarro  Procedure(s) Performed: Procedure(s) (LRB): LEFT UNICOMPARTMENTAL KNEE (Left)  Patient location during evaluation: PACU Anesthesia Type: General Level of consciousness: awake and alert Pain management: pain level controlled Vital Signs Assessment: post-procedure vital signs reviewed and stable Respiratory status: spontaneous breathing and respiratory function stable Cardiovascular status: blood pressure returned to baseline and stable Postop Assessment: no signs of nausea or vomiting Anesthetic complications: no    Last Vitals:  Filed Vitals:   07/12/15 0945 07/12/15 1030  BP: 125/58 126/70  Pulse: 71 77  Temp:  36.7 C  Resp: 15 20    Last Pain:  Filed Vitals:   07/12/15 1031  PainSc: 0-No pain                 Lanetta Figuero S

## 2015-07-12 NOTE — Discharge Instructions (Signed)
INSTRUCTIONS AFTER JOINT REPLACEMENT   o Remove items at home which could result in a fall. This includes throw rugs or furniture in walking pathways o ICE to the affected joint every three hours while awake for 30 minutes at a time, for at least the first 3-5 days, and then as needed for pain and swelling.  Continue to use ice for pain and swelling. You may notice swelling that will progress down to the foot and ankle.  This is normal after surgery.  Elevate your leg when you are not up walking on it.   o Continue to use the breathing machine you got in the hospital (incentive spirometer) which will help keep your temperature down.  It is common for your temperature to cycle up and down following surgery, especially at night when you are not up moving around and exerting yourself.  The breathing machine keeps your lungs expanded and your temperature down.   DIET:  As you were doing prior to hospitalization, we recommend a well-balanced diet.  DRESSING / WOUND CARE / SHOWERING  You may change your dressing 3-5 days after surgery.  Then change the dressing every day with sterile gauze.  Please use good hand washing techniques before changing the dressing.  Do not use any lotions or creams on the incision until instructed by your surgeon.  ACTIVITY  o Increase activity slowly as tolerated, but follow the weight bearing instructions below.   o No driving for 6 weeks or until further direction given by your physician.  You cannot drive while taking narcotics.  o No lifting or carrying greater than 10 lbs. until further directed by your surgeon. o Avoid periods of inactivity such as sitting longer than an hour when not asleep. This helps prevent blood clots.  o You may return to work once you are authorized by your doctor.     WEIGHT BEARING   Weight bearing as tolerated with assist device (walker, cane, etc) as directed, use it as long as suggested by your surgeon or therapist, typically at  least 4-6 weeks.   EXERCISES  Results after joint replacement surgery are often greatly improved when you follow the exercise, range of motion and muscle strengthening exercises prescribed by your doctor. Safety measures are also important to protect the joint from further injury. Any time any of these exercises cause you to have increased pain or swelling, decrease what you are doing until you are comfortable again and then slowly increase them. If you have problems or questions, call your caregiver or physical therapist for advice.   Rehabilitation is important following a joint replacement. After just a few days of immobilization, the muscles of the leg can become weakened and shrink (atrophy).  These exercises are designed to build up the tone and strength of the thigh and leg muscles and to improve motion. Often times heat used for twenty to thirty minutes before working out will loosen up your tissues and help with improving the range of motion but do not use heat for the first two weeks following surgery (sometimes heat can increase post-operative swelling).   These exercises can be done on a training (exercise) mat, on the floor, on a table or on a bed. Use whatever works the best and is most comfortable for you.    Use music or television while you are exercising so that the exercises are a pleasant break in your day. This will make your life better with the exercises acting as a break  in your routine that you can look forward to.   Perform all exercises about fifteen times, three times per day or as directed.  You should exercise both the operative leg and the other leg as well.  Exercises include:    Quad Sets - Tighten up the muscle on the front of the thigh (Quad) and hold for 5-10 seconds.    Straight Leg Raises - With your knee straight (if you were given a brace, keep it on), lift the leg to 60 degrees, hold for 3 seconds, and slowly lower the leg.  Perform this exercise against  resistance later as your leg gets stronger.   Leg Slides: Lying on your back, slowly slide your foot toward your buttocks, bending your knee up off the floor (only go as far as is comfortable). Then slowly slide your foot back down until your leg is flat on the floor again.   Angel Wings: Lying on your back spread your legs to the side as far apart as you can without causing discomfort.   Hamstring Strength:  Lying on your back, push your heel against the floor with your leg straight by tightening up the muscles of your buttocks.  Repeat, but this time bend your knee to a comfortable angle, and push your heel against the floor.  You may put a pillow under the heel to make it more comfortable if necessary.   A rehabilitation program following joint replacement surgery can speed recovery and prevent re-injury in the future due to weakened muscles. Contact your doctor or a physical therapist for more information on knee rehabilitation.    CONSTIPATION  Constipation is defined medically as fewer than three stools per week and severe constipation as less than one stool per week.  Even if you have a regular bowel pattern at home, your normal regimen is likely to be disrupted due to multiple reasons following surgery.  Combination of anesthesia, postoperative narcotics, change in appetite and fluid intake all can affect your bowels.   YOU MUST use at least one of the following options; they are listed in order of increasing strength to get the job done.  They are all available over the counter, and you may need to use some, POSSIBLY even all of these options:    Drink plenty of fluids (prune juice may be helpful) and high fiber foods Colace 100 mg by mouth twice a day  Senokot for constipation as directed and as needed Dulcolax (bisacodyl), take with full glass of water  Miralax (polyethylene glycol) once or twice a day as needed.  If you have tried all these things and are unable to have a bowel  movement in the first 3-4 days after surgery call either your surgeon or your primary doctor.    If you experience loose stools or diarrhea, hold the medications until you stool forms back up.  If your symptoms do not get better within 1 week or if they get worse, check with your doctor.  If you experience "the worst abdominal pain ever" or develop nausea or vomiting, please contact the office immediately for further recommendations for treatment.   ITCHING:  If you experience itching with your medications, try taking only a single pain pill, or even half a pain pill at a time.  You can also use Benadryl over the counter for itching or also to help with sleep.   TED HOSE STOCKINGS:  Use stockings on both legs until for at least 2 weeks or as  directed by physician office. They may be removed at night for sleeping.  MEDICATIONS:  See your medication summary on the After Visit Summary that nursing will review with you.  You may have some home medications which will be placed on hold until you complete the course of blood thinner medication.  It is important for you to complete the blood thinner medication as prescribed.  PRECAUTIONS:  If you experience chest pain or shortness of breath - call 911 immediately for transfer to the hospital emergency department.   If you develop a fever greater that 101 F, purulent drainage from wound, increased redness or drainage from wound, foul odor from the wound/dressing, or calf pain - CONTACT YOUR SURGEON.                                                   FOLLOW-UP APPOINTMENTS:  If you do not already have a post-op appointment, please call the office for an appointment to be seen by your surgeon.  Guidelines for how soon to be seen are listed in your After Visit Summary, but are typically between 1-4 weeks after surgery.   MAKE SURE YOU:   Understand these instructions.   Get help right away if you are not doing well or get worse.    Thank you for  letting us be a part of your medical care team.  It is a privilege we respect greatly.  We hope these instructions will help you stay on track for a fast and full recovery!   Regional Anesthesia Blocks  1. Numbness or the inability to move the "blocked" extremity may last from 3-48 hours after placement. The length of time depends on the medication injected and your individual response to the medication. If the numbness is not going away after 48 hours, call your surgeon.  2. The extremity that is blocked will need to be protected until the numbness is gone and the  Strength has returned. Because you cannot feel it, you will need to take extra care to avoid injury. Because it may be weak, you may have difficulty moving it or using it. You may not know what position it is in without looking at it while the block is in effect.  3. For blocks in the legs and feet, returning to weight bearing and walking needs to be done carefully. You will need to wait until the numbness is entirely gone and the strength has returned. You should be able to move your leg and foot normally before you try and bear weight or walk. You will need someone to be with you when you first try to ensure you do not fall and possibly risk injury.  4. Bruising and tenderness at the needle site are common side effects and will resolve in a few days.  5. Persistent numbness or new problems with movement should be communicated to the surgeon or the [facility] [phone] [place] [phone]).  Post Anesthesia Home Care Instructions  Activity: Get plenty of rest for the remainder of the day. A responsible adult should stay with you for 24 hours following the procedure.  For the next 24 hours, DO NOT: -Drive a car -Paediatric nurse -Drink alcoholic beverages -Take any medication unless instructed by your physician -Make any legal decisions or sign important papers.  Meals: Start  with liquid  foods such as gelatin or soup. Progress to regular foods as tolerated. Avoid greasy, spicy, heavy foods. If nausea and/or vomiting occur, drink only clear liquids until the nausea and/or vomiting subsides. Call your physician if vomiting continues.  Special Instructions/Symptoms: Your throat may feel dry or sore from the anesthesia or the breathing tube placed in your throat during surgery. If this causes discomfort, gargle with warm salt water. The discomfort should disappear within 24 hours.  If you had a scopolamine patch placed behind your ear for the management of post- operative nausea and/or vomiting:  1. The medication in the patch is effective for 72 hours, after which it should be removed.  Wrap patch in a tissue and discard in the trash. Wash hands thoroughly with soap and water. 2. You may remove the patch earlier than 72 hours if you experience unpleasant side effects which may include dry mouth, dizziness or visual disturbances. 3. Avoid touching the patch. Wash your hands with soap and water after contact with the patch.

## 2015-07-12 NOTE — Transfer of Care (Signed)
Immediate Anesthesia Transfer of Care Note  Patient: Stacey Navarro  Procedure(s) Performed: Procedure(s): LEFT UNICOMPARTMENTAL KNEE (Left)  Patient Location: PACU  Anesthesia Type:GA combined with regional for post-op pain  Level of Consciousness: awake and patient cooperative  Airway & Oxygen Therapy: Patient Spontanous Breathing and Patient connected to face mask oxygen  Post-op Assessment: Report given to RN and Post -op Vital signs reviewed and stable  Post vital signs: Reviewed and stable  Last Vitals:  Filed Vitals:   07/12/15 0710 07/12/15 0715  BP:  113/65  Pulse: 84 84  Temp:    Resp: 15 17    Complications: No apparent anesthesia complications

## 2015-07-12 NOTE — Op Note (Signed)
07/12/2015  8:57 AM  PATIENT:  Stacey Navarro    PRE-OPERATIVE DIAGNOSIS:  Unilateral primary osteoarthitis left knee  POST-OPERATIVE DIAGNOSIS:  Same  PROCEDURE:  LEFT UNICOMPARTMENTAL KNEE  SURGEON:  Johnny Bridge, MD  PHYSICIAN ASSISTANT: Joya Gaskins, OPA-C, present and scrubbed throughout the case, critical for completion in a timely fashion, and for retraction, instrumentation, and closure.  ANESTHESIA:   General  PREOPERATIVE INDICATIONS:  [name] is a  74 y.o. female with a diagnosis of Unilateral primary osteoarthitis left knee who failed conservative measures and elected for surgical management.    The risks benefits and alternatives were discussed with the patient preoperatively including but not limited to the risks of infection, bleeding, nerve injury, cardiopulmonary complications, blood clots, the need for revision surgery, among others, and the patient was willing to proceed.  OPERATIVE IMPLANTS: Biomet [place] mobile bearing medial compartment arthroplasty femur size medium, tibia size B, bearing size 4.  Unique aspects of the case: I was surprised that the femoral size was a medium, whereas the contralateral side was a small, but nonetheless small was just too tight with the spoon, and also the tibial size was slightly bigger than the contralateral side. Again, she was deeper from front to back and she was medial to lateral, but the size B fit quite nicely and provided better front to back coverage.  OPERATIVE FINDINGS: Endstage grade 4 medial compartment osteoarthritis. No significant changes in the lateral with just a small amount of grade 2+ changes in the patellofemoral joint.  The ACL was intact.  OPERATIVE PROCEDURE: The patient was brought to the operating room placed in supine position. General anesthesia was administered. IV antibiotics were given. The lower extremity was placed in the legholder and prepped and draped in usual sterile  fashion.  Time out was performed.  The leg was elevated and exsanguinated and the tourniquet was inflated. Anteromedial incision was performed, and I took care to preserve the MCL. Parapatellar incision was carried out, and the osteophytes were excised, along with the medial meniscus and a small portion of the fat pad.  The extra medullary tibial cutting jig was applied, using the spoon and the 41m G-Clamp and the 2 mm shim, and I took care to protect the anterior cruciate ligament insertion and the tibial spine. The medial collateral ligament was also protected, and I resected my proximal tibia, matching the anatomic slope.   The proximal tibial bony cut was removed in one piece, and I turned my attention to the femur.  The intramedullary femoral rod was placed using the drill, and then using the appropriate reference, I assembled the femoral jig, setting my posterior cutting block. I resected my posterior femur, used the 0 spigot for the anterior femur, and then measured my gap.   I then used the appropriate mill to match the extension gap to the flexion gap. The second milling was at a 4.  The gaps were then measured again with the appropriate feeler gauges. Once I had balanced flexion and extension gaps, I then completed the preparation of the femur.  I milled off the anterior aspect of the distal femur to prevent impingement. I also exposed the tibia, and selected the above-named component, and then used the cutting jig to prepare the keel slot on the tibia. I also used the awl to curette out the bone to complete the preparation of the keel. The back wall was intact.  I then placed trial components, and it was found to  have excellent motion, and appropriate balance.  I then cemented the components into place, cementing the tibia first, removing all excess cement, and then cementing the femur.  All loose cement was removed.  The real polyethylene insert was applied manually, and the knee was  taken through functional range of motion, and found to have excellent stability and restoration of joint motion, with excellent balance.  The wounds were irrigated copiously, and the parapatellar tissue closed with Vicryl, followed by Vicryl for the subcutaneous tissue, with routine closure with Steri-Strips and sterile gauze.  The tourniquet was released, and the patient was awakened and extubated and returned to PACU in stable and satisfactory condition. There were no complications.

## 2015-07-12 NOTE — Progress Notes (Signed)
Assisted Dr. Rodman Comp with left, ultrasound guided, femoral block. Side rails up, monitors on throughout procedure. See vital signs in flow sheet. Tolerated Procedure well.

## 2015-07-12 NOTE — Anesthesia Preprocedure Evaluation (Signed)
Anesthesia Evaluation  Patient identified by MRN, date of birth, ID band Patient awake    Reviewed: Allergy & Precautions, NPO status , Patient's Chart, lab work & pertinent test results  Airway Mallampati: II  TM Distance: >3 FB Neck ROM: Full    Dental no notable dental hx.    Pulmonary neg pulmonary ROS, former smoker,    Pulmonary exam normal breath sounds clear to auscultation       Cardiovascular negative cardio ROS Normal cardiovascular exam Rhythm:Regular Rate:Normal     Neuro/Psych negative neurological ROS  negative psych ROS   GI/Hepatic Neg liver ROS, GERD  Medicated,  Endo/Other  negative endocrine ROS  Renal/GU negative Renal ROS  negative genitourinary   Musculoskeletal negative musculoskeletal ROS (+)   Abdominal   Peds negative pediatric ROS (+)  Hematology negative hematology ROS (+)   Anesthesia Other Findings   Reproductive/Obstetrics negative OB ROS                             Anesthesia Physical  Anesthesia Plan  ASA: II  Anesthesia Plan: General and Regional   Post-op Pain Management: GA combined w/ Regional for post-op pain   Induction: Intravenous  Airway Management Planned: LMA  Additional Equipment:   Intra-op Plan:   Post-operative Plan: Extubation in OR  Informed Consent: I have reviewed the patients History and Physical, chart, labs and discussed the procedure including the risks, benefits and alternatives for the proposed anesthesia with the patient or authorized representative who has indicated his/her understanding and acceptance.   Dental advisory given  Plan Discussed with: CRNA and Surgeon  Anesthesia Plan Comments:         Anesthesia Quick Evaluation

## 2015-07-12 NOTE — Anesthesia Procedure Notes (Addendum)
Anesthesia Regional Block:  Femoral nerve block  Pre-Anesthetic Checklist: ,, timeout performed, Correct Patient, Correct Site, Correct Laterality, Correct Procedure, Correct Position, site marked, Risks and benefits discussed,  Surgical consent,  Pre-op evaluation,  At surgeon's request and post-op pain management  Laterality: Left  Prep: chloraprep       Needles:  Injection technique: Single-shot  Needle Type: Echogenic Stimulator Needle     Needle Length: 9cm 9 cm Needle Gauge: 21 and 21 G    Additional Needles:  Procedures: ultrasound guided (picture in chart) and nerve stimulator Femoral nerve block  Nerve Stimulator or Paresthesia:  Response: quadraceps contraction, 0.45 mA,   Additional Responses:   Narrative:  Start time: 07/12/2015 6:55 AM End time: 07/12/2015 7:05 AM Injection made incrementally with aspirations every 5 mL.  Performed by: Personally  Anesthesiologist: Suzette Battiest   Procedure Name: LMA Insertion Date/Time: 07/12/2015 7:33 AM Performed by: Marrianne Mood Pre-anesthesia Checklist: Patient identified, Emergency Drugs available, Suction available, Patient being monitored and Timeout performed Patient Re-evaluated:Patient Re-evaluated prior to inductionOxygen Delivery Method: Circle System Utilized Preoxygenation: Pre-oxygenation with 100% oxygen Intubation Type: IV induction Ventilation: Mask ventilation without difficulty LMA: LMA inserted LMA Size: 4.0 Number of attempts: 1 Airway Equipment and Method: Bite block Placement Confirmation: positive ETCO2 Tube secured with: Tape Dental Injury: Teeth and Oropharynx as per pre-operative assessment

## 2015-07-15 ENCOUNTER — Encounter (HOSPITAL_BASED_OUTPATIENT_CLINIC_OR_DEPARTMENT_OTHER): Payer: Self-pay | Admitting: Orthopedic Surgery

## 2015-08-26 MED FILL — HYDROCODON-APAP 5-325: 5-325 | 8 days supply | Qty: 30 | Fill #0

## 2015-10-21 ENCOUNTER — Other Ambulatory Visit: Payer: Self-pay | Admitting: Internal Medicine

## 2015-10-21 DIAGNOSIS — Z9889 Other specified postprocedural states: Secondary | ICD-10-CM

## 2015-10-21 DIAGNOSIS — Z853 Personal history of malignant neoplasm of breast: Secondary | ICD-10-CM

## 2015-10-30 ENCOUNTER — Ambulatory Visit
Admission: RE | Admit: 2015-10-30 | Discharge: 2015-10-30 | Disposition: A | Discharge status: Home / Self Care | Payer: Medicare Other | Source: Ambulatory Visit | Attending: Internal Medicine | Admitting: Internal Medicine

## 2015-10-30 DIAGNOSIS — Z9889 Other specified postprocedural states: Secondary | ICD-10-CM

## 2015-10-30 DIAGNOSIS — R928 Other abnormal and inconclusive findings on diagnostic imaging of breast: Secondary | ICD-10-CM | POA: Diagnosis not present

## 2015-10-30 DIAGNOSIS — Z853 Personal history of malignant neoplasm of breast: Secondary | ICD-10-CM

## 2015-10-30 LAB — HM MAMMOGRAPHY

## 2015-12-05 ENCOUNTER — Encounter: Payer: Self-pay | Admitting: Internal Medicine

## 2015-12-06 ENCOUNTER — Ambulatory Visit: Payer: Medicare Other | Admitting: Hematology and Oncology

## 2015-12-10 ENCOUNTER — Telehealth: Payer: Self-pay | Admitting: Hematology and Oncology

## 2015-12-10 ENCOUNTER — Encounter: Payer: Self-pay | Admitting: Hematology and Oncology

## 2015-12-10 ENCOUNTER — Ambulatory Visit (HOSPITAL_BASED_OUTPATIENT_CLINIC_OR_DEPARTMENT_OTHER): Payer: Medicare Other | Admitting: Hematology and Oncology

## 2015-12-10 VITALS — BP 137/72 | HR 70 | Temp 98.5°F | Resp 18 | Wt 214.7 lb

## 2015-12-10 DIAGNOSIS — Z170 Estrogen receptor positive status [ER+]: Secondary | ICD-10-CM | POA: Diagnosis not present

## 2015-12-10 DIAGNOSIS — C50211 Malignant neoplasm of upper-inner quadrant of right female breast: Secondary | ICD-10-CM | POA: Diagnosis not present

## 2015-12-10 DIAGNOSIS — Z79811 Long term (current) use of aromatase inhibitors: Secondary | ICD-10-CM | POA: Diagnosis not present

## 2015-12-10 MED ORDER — ANASTROZOLE 1 MG PO TABS: 1.0000 mg | ORAL_TABLET | Freq: Every day | ORAL | Status: DC

## 2015-12-10 NOTE — Assessment & Plan Note (Signed)
Rt Lumpectomy 11/14/14: IDC Grade 3, 2.7 cm size, 0/3 LN , Margins clear; T2N0 Stage IIA Right breast invasive ductal carcinoma diagnosed with screening mammogram, 3.1 cm by ultrasound in the upper quadrant 6 cm from the nipple, grade 2-3, EF 100%, PR 48%, HER-2 negative ratio 0.6, Ki-67 was 28%, Oncotype DX score 19, 12% risk of recurrence, status post radiation 4800 cGy in 24 sessions completed 01/29/2015  Treatment plan: antiestrogen therapy with anastrozole 1 mg daily started 02/14/2015  Anastrozole toxicities: Tolerating it very well without any side effects  Breast Cancer Surveillance: 1. Breast exam 12/10/2015: Normal 2. Mammograms 10/30/2015:No concern for cancer, breast density category C  Return to clinic in 6 months for follow-up

## 2015-12-10 NOTE — Progress Notes (Signed)
Patient Care Team: [name], MD as PCP - General (Internal Medicine) [name], MD as Consulting Physician (General Surgery) [name], MD as Consulting Physician (Hematology and Oncology) [name], MD as Consulting Physician (Radiation Oncology) Mauro Kaufmann, RN as Registered Nurse Rockwell Germany, RN as Registered Nurse Holley Bouche, NP as Nurse Practitioner (Nurse Practitioner) Marchia Bond, MD as Consulting Physician (Orthopedic Surgery) Sylvan Cheese, NP as Nurse Practitioner (Hematology and Oncology)  DIAGNOSIS: Breast cancer of upper-inner quadrant of right female breast [facility])   Staging form: Breast, AJCC 7th Edition     Clinical stage from 10/31/2014: Stage IIA (T2, N0, M0) - Unsigned     Pathologic stage from 11/15/2014: Stage IIA (T2, N0, cM0) - Signed by Enid Cutter, MD on 11/26/2014       Staging comments: Staged on final lumpectomy specimen by Dr. Donato Heinz.    SUMMARY OF ONCOLOGIC HISTORY:   Breast cancer of upper-inner quadrant of right female breast ([place])   10/19/2014 Mammogram Right breast mass 3.1 cm upper quadrant, 6 cm from the nipple   10/19/2014 Initial Biopsy Right breast core needle bx: invasive ductal carcinoma, grade 2-3, ER+ (100%), PR+ (48%), HER-2 negative (ratio 0.6), Ki-67 28%   10/20/2014 Clinical Stage Stage IIA: T2 N0   11/13/2014 Definitive Surgery Right Lumpectomy/SLNB Dalbert Batman): IDC, grade 3, 2.7 cm size, 3 LN removed and negative for malignancy (0/3) margins clear. HER2/neu repeated and remains negative (ratio 0.88).   11/13/2014 Pathologic Stage Stage IIA: pT2 pN0   11/13/2014 Oncotype testing Score 19 (12% ROR). No chemotherapy [facility]).   12/26/2014 - 01/29/2015 Radiation Therapy Adjuvant RT Valere Dross): Right breast 48 Gy over 24 sessions   02/14/2015 -  Anti-estrogen oral therapy Anastrozole 1 mg daily. Planned duration of therapy 5 years [place])    Survivorship Survivorship care plan completed and mailed to patient in lieu of  inperson visit.    CHIEF COMPLIANT: tolerating anastrozole extremely well  INTERVAL HISTORY: Stacey Navarro is a 75 year old with above-mentioned history of right breast cancer treated with lumpectomyand radiation and is now on anastrozole since July 2016. She appears to be tolerating it extremely well. She denies any lumps or nodules in the breast. She denies any hot flashes or myalgias.  REVIEW OF SYSTEMS:   Constitutional: Denies fevers, chills or abnormal weight loss Eyes: Denies blurriness of vision Ears, nose, mouth, throat, and face: Denies mucositis or sore throat Respiratory: Denies cough, dyspnea or wheezes Cardiovascular: Denies palpitation, chest discomfort Gastrointestinal:  Denies nausea, heartburn or change in bowel habits Skin: Denies abnormal skin rashes Lymphatics: Denies new lymphadenopathy or easy bruising Neurological:Denies numbness, tingling or new weaknesses Behavioral/Psych: Mood is stable, no new changes  Extremities: No lower extremity edema Breast:  denies any pain or lumps or nodules in either breasts All other systems were reviewed with the patient and are negative.  I have reviewed the past medical history, past surgical history, social history and family history with the patient and they are unchanged from previous note.  ALLERGIES:  has No Known Allergies.  MEDICATIONS:  Current Outpatient Prescriptions  Medication Sig Dispense Refill  . anastrozole (ARIMIDEX) 1 MG tablet Take 1 tablet (1 mg total) by mouth daily. 90 tablet 3  . baclofen (LIORESAL) 10 MG tablet Take 1 tablet (10 mg total) by mouth 3 (three) times daily. As needed for muscle spasm 50 tablet 0  . calcium carbonate (OS-CAL) 1250 (500 CA) MG chewable tablet Chew 1 tablet by mouth daily.    Marland Kitchen  cholecalciferol (VITAMIN D) 1000 UNITS tablet Take 1,000 Units by mouth daily.    . Glucosamine-Chondroit-Vit C-Mn (GLUCOSAMINE 1500 COMPLEX PO) Take by mouth.    . Omega-3 Fatty Acids (FISH OIL)  1000 MG CPDR Take by mouth.    . ondansetron (ZOFRAN) 4 MG tablet Take 1 tablet (4 mg total) by mouth every 8 (eight) hours as needed for nausea or vomiting. 30 tablet 0  . oxyCODONE-acetaminophen (ROXICET) 5-325 MG tablet Take 1-2 tablets by mouth every 6 (six) hours as needed for severe pain. 50 tablet 0  . pantoprazole (PROTONIX) 40 MG tablet TAKE 1 TABLET (40 MG TOTAL) BY MOUTH DAILY. 30 tablet 11  . rivaroxaban (XARELTO) 10 MG TABS tablet Take 1 tablet (10 mg total) by mouth daily. 21 tablet 0  . sennosides-docusate sodium (SENOKOT-S) 8.6-50 MG tablet Take 2 tablets by mouth daily. 30 tablet 1  . vitamin E 400 UNIT capsule Take 400 Units by mouth daily.     No current facility-administered medications for this visit.    PHYSICAL EXAMINATION: ECOG PERFORMANCE STATUS: 0 - Asymptomatic  Filed Vitals:   12/10/15 0946  BP: 137/72  Pulse: 70  Temp: 98.5 F (36.9 C)  Resp: 18   Filed Weights   12/10/15 0946  Weight: 214 lb 11.2 oz (97.387 kg)    GENERAL:alert, no distress and comfortable SKIN: skin color, texture, turgor are normal, no rashes or significant lesions EYES: normal, Conjunctiva are pink and non-injected, sclera clear OROPHARYNX:no exudate, no erythema and lips, buccal mucosa, and tongue normal  NECK: supple, thyroid normal size, non-tender, without nodularity LYMPH:  no palpable lymphadenopathy in the cervical, axillary or inguinal LUNGS: clear to auscultation and percussion with normal breathing effort HEART: regular rate & rhythm and no murmurs and no lower extremity edema ABDOMEN:abdomen soft, non-tender and normal bowel sounds MUSCULOSKELETAL:no cyanosis of digits and no clubbing  NEURO: alert & oriented x 3 with fluent speech, no focal motor/sensory deficits EXTREMITIES: No lower extremity edema BREAST: No palpable masses or nodules in either right or left breasts. No palpable axillary supraclavicular or infraclavicular adenopathy no breast tenderness or nipple  discharge. (exam performed in the presence of a chaperone)  LABORATORY DATA:  I have reviewed the data as listed   Chemistry      Component Value Date/Time   NA 138 05/03/2015 0910   NA 142 10/31/2014 0818   K 4.7 05/03/2015 0910   K 4.6 10/31/2014 0818   CL 110 05/03/2015 0910   CO2 24 10/31/2014 0818   CO2 27 10/03/2014 1055   BUN 29* 05/03/2015 0910   BUN 18.9 10/31/2014 0818   CREATININE 0.80 05/03/2015 0910   CREATININE 1.0 10/31/2014 0818      Component Value Date/Time   CALCIUM 9.4 10/31/2014 0818   CALCIUM 9.9 10/03/2014 1055   ALKPHOS 112 10/31/2014 0818   ALKPHOS 108 10/03/2014 1055   AST 33 10/31/2014 0818   AST 33 10/03/2014 1055   ALT 48 10/31/2014 0818   ALT 48* 10/03/2014 1055   BILITOT 0.41 10/31/2014 0818   BILITOT 0.7 10/03/2014 1055       Lab Results  Component Value Date   WBC 5.8 05/03/2015   HGB 15.0 05/03/2015   HCT 44.0 05/03/2015   MCV 98.6 05/03/2015   PLT 220 05/03/2015   NEUTROABS 4.2 10/31/2014     ASSESSMENT & PLAN:  Breast cancer of upper-inner quadrant of right female breast Rt Lumpectomy 11/14/14: IDC Grade 3, 2.7 cm size, 0/3 LN ,  Margins clear; T2N0 Stage IIA Right breast invasive ductal carcinoma diagnosed with screening mammogram, 3.1 cm by ultrasound in the upper quadrant 6 cm from the nipple, grade 2-3, EF 100%, PR 48%, HER-2 negative ratio 0.6, Ki-67 was 28%, Oncotype DX score 19, 12% risk of recurrence, status post radiation 4800 cGy in 24 sessions completed 01/29/2015  Treatment plan: antiestrogen therapy with anastrozole 1 mg daily started 02/14/2015  Anastrozole toxicities: Tolerating it very well without any side effects  Breast Cancer Surveillance: 1. Breast exam 12/10/2015: Normal 2. Mammograms 10/30/2015:No concern for cancer, breast density category C  Return to clinic in 6 months for follow-up and after that we can see her once a year  No orders of the defined types were placed in this encounter.   The  patient has a good understanding of the overall plan. she agrees with it. she will call with any problems that may develop before the next visit here.   Rulon Eisenmenger, MD 12/10/2015

## 2015-12-10 NOTE — Telephone Encounter (Signed)
Gave and printed appt sched and avs fo rpt; for NOV  °

## 2015-12-18 ENCOUNTER — Encounter: Payer: Self-pay | Admitting: Internal Medicine

## 2015-12-26 ENCOUNTER — Other Ambulatory Visit: Payer: Self-pay | Admitting: Internal Medicine

## 2016-03-16 ENCOUNTER — Ambulatory Visit (INDEPENDENT_AMBULATORY_CARE_PROVIDER_SITE_OTHER): Payer: Medicare Other | Admitting: Internal Medicine

## 2016-03-16 ENCOUNTER — Encounter: Payer: Self-pay | Admitting: Internal Medicine

## 2016-03-16 ENCOUNTER — Other Ambulatory Visit (INDEPENDENT_AMBULATORY_CARE_PROVIDER_SITE_OTHER): Payer: Medicare Other

## 2016-03-16 VITALS — BP 130/88 | HR 91 | Temp 98.8°F | Ht 66.0 in | Wt 215.4 lb

## 2016-03-16 DIAGNOSIS — R739 Hyperglycemia, unspecified: Secondary | ICD-10-CM

## 2016-03-16 DIAGNOSIS — K2100 Gastro-esophageal reflux disease with esophagitis, without bleeding: Secondary | ICD-10-CM

## 2016-03-16 DIAGNOSIS — E785 Hyperlipidemia, unspecified: Secondary | ICD-10-CM | POA: Diagnosis not present

## 2016-03-16 DIAGNOSIS — E669 Obesity, unspecified: Secondary | ICD-10-CM | POA: Insufficient documentation

## 2016-03-16 DIAGNOSIS — Z0000 Encounter for general adult medical examination without abnormal findings: Secondary | ICD-10-CM

## 2016-03-16 DIAGNOSIS — Z23 Encounter for immunization: Secondary | ICD-10-CM | POA: Diagnosis not present

## 2016-03-16 DIAGNOSIS — R7303 Prediabetes: Secondary | ICD-10-CM | POA: Insufficient documentation

## 2016-03-16 LAB — COMPREHENSIVE METABOLIC PANEL
ALBUMIN: 4.3 g/dL (ref 3.5–5.2)
ALK PHOS: 126 U/L — AB (ref 39–117)
ALT: 48 U/L — ABNORMAL HIGH (ref 0–35)
AST: 34 U/L (ref 0–37)
BUN: 21 mg/dL (ref 6–23)
CALCIUM: 10.5 mg/dL (ref 8.4–10.5)
CHLORIDE: 101 meq/L (ref 96–112)
CO2: 27 mEq/L (ref 19–32)
Creatinine, Ser: 1.01 mg/dL (ref 0.40–1.20)
GFR: 57.04 mL/min — AB (ref >60.00)
Glucose, Bld: 108 mg/dL — ABNORMAL HIGH (ref 70–99)
POTASSIUM: 4.1 meq/L (ref 3.5–5.1)
Sodium: 139 mEq/L (ref 135–145)
TOTAL PROTEIN: 7.8 g/dL (ref 6.0–8.3)
Total Bilirubin: 0.6 mg/dL (ref 0.2–1.2)

## 2016-03-16 LAB — CBC WITH DIFFERENTIAL/PLATELET
BASOS PCT: 0.5 % (ref 0.0–3.0)
Basophils Absolute: 0 10*3/uL (ref 0.0–0.1)
EOS PCT: 2.6 % (ref 0.0–5.0)
Eosinophils Absolute: 0.2 10*3/uL (ref 0.0–0.7)
HEMATOCRIT: 45.8 % (ref 36.0–46.0)
HEMOGLOBIN: 15.7 g/dL — AB (ref 12.0–15.0)
LYMPHS PCT: 22.8 % (ref 12.0–46.0)
Lymphs Abs: 1.6 10*3/uL (ref 0.7–4.0)
MCHC: 34.3 g/dL (ref 30.0–36.0)
MCV: 94.7 fl (ref 78.0–100.0)
MONO ABS: 0.8 10*3/uL (ref 0.1–1.0)
Monocytes Relative: 11.3 % (ref 3.0–12.0)
Neutro Abs: 4.3 10*3/uL (ref 1.4–7.7)
Neutrophils Relative %: 62.8 % (ref 43.0–77.0)
Platelets: 298 10*3/uL (ref 150.0–400.0)
RBC: 4.83 Mil/uL (ref 3.87–5.11)
RDW: 14.7 % (ref 11.5–15.5)
WBC: 6.8 10*3/uL (ref 4.0–10.5)

## 2016-03-16 LAB — LIPID PANEL
CHOLESTEROL: 275 mg/dL — AB (ref 0–200)
HDL: 71.7 mg/dL (ref >39.00)
LDL CALC: 179 mg/dL — AB (ref 0–99)
NonHDL: 203.43
TRIGLYCERIDES: 121 mg/dL (ref 0.0–149.0)
Total CHOL/HDL Ratio: 4
VLDL: 24.2 mg/dL (ref 0.0–40.0)

## 2016-03-16 LAB — TSH: TSH: 4 u[IU]/mL (ref 0.35–4.50)

## 2016-03-16 LAB — HEMOGLOBIN A1C: HEMOGLOBIN A1C: 5.9 % (ref 4.6–6.5)

## 2016-03-16 MED ORDER — PANTOPRAZOLE SODIUM 40 MG PO TBEC: 40.0000 mg | DELAYED_RELEASE_TABLET | Freq: Every day | ORAL | 1 refills | Status: DC

## 2016-03-16 MED ORDER — ZOSTER VACCINE LIVE 19400 UNT/0.65ML ~~LOC~~ SUSR: 0.6500 mL | Freq: Once | SUBCUTANEOUS | 0 refills | Status: AC

## 2016-03-16 NOTE — Assessment & Plan Note (Signed)

## 2016-03-16 NOTE — Assessment & Plan Note (Signed)
She agrees to work on lifestyle modification to lose weight.

## 2016-03-16 NOTE — Patient Instructions (Signed)
Preventive Care for Adults, Female A healthy lifestyle and preventive care can promote health and wellness. Preventive health guidelines for women include the following key practices.  A routine yearly physical is a good way to check with your health care provider about your health and preventive screening. It is a chance to share any concerns and updates on your health and to receive a thorough exam.  Visit your dentist for a routine exam and preventive care every 6 months. Brush your teeth twice a day and floss once a day. Good oral hygiene prevents tooth decay and gum disease.  The frequency of eye exams is based on your age, health, family medical history, use of contact lenses, and other factors. Follow your health care provider's recommendations for frequency of eye exams.  Eat a healthy diet. Foods like vegetables, fruits, whole grains, low-fat dairy products, and lean protein foods contain the nutrients you need without too many calories. Decrease your intake of foods high in solid fats, added sugars, and salt. Eat the right amount of calories for you.Get information about a proper diet from your health care provider, if necessary.  Regular physical exercise is one of the most important things you can do for your health. Most adults should get at least 150 minutes of moderate-intensity exercise (any activity that increases your heart rate and causes you to sweat) each week. In addition, most adults need muscle-strengthening exercises on 2 or more days a week.  Maintain a healthy weight. The body mass index (BMI) is a screening tool to identify possible weight problems. It provides an estimate of body fat based on height and weight. Your health care provider can find your BMI and can help you achieve or maintain a healthy weight.For adults 20 years and older:  A BMI below 18.5 is considered underweight.  A BMI of 18.5 to 24.9 is normal.  A BMI of 25 to 29.9 is considered overweight.  A  BMI of 30 and above is considered obese.  Maintain normal blood lipids and cholesterol levels by exercising and minimizing your intake of saturated fat. Eat a balanced diet with plenty of fruit and vegetables. Blood tests for lipids and cholesterol should begin at age 45 and be repeated every 5 years. If your lipid or cholesterol levels are high, you are over 50, or you are at high risk for heart disease, you may need your cholesterol levels checked more frequently.Ongoing high lipid and cholesterol levels should be treated with medicines if diet and exercise are not working.  If you smoke, find out from your health care provider how to quit. If you do not use tobacco, do not start.  Lung cancer screening is recommended for adults aged 45-80 years who are at high risk for developing lung cancer because of a history of smoking. A yearly low-dose CT scan of the lungs is recommended for people who have at least a 30-pack-year history of smoking and are a current smoker or have quit within the past 15 years. A pack year of smoking is smoking an average of 1 pack of cigarettes a day for 1 year (for example: 1 pack a day for 30 years or 2 packs a day for 15 years). Yearly screening should continue until the smoker has stopped smoking for at least 15 years. Yearly screening should be stopped for people who develop a health problem that would prevent them from having lung cancer treatment.  If you are pregnant, do not drink alcohol. If you are  breastfeeding, be very cautious about drinking alcohol. If you are not pregnant and choose to drink alcohol, do not have more than 1 drink per day. One drink is considered to be 12 ounces (355 mL) of beer, 5 ounces (148 mL) of wine, or 1.5 ounces (44 mL) of liquor.  Avoid use of street drugs. Do not share needles with anyone. Ask for help if you need support or instructions about stopping the use of drugs.  High blood pressure causes heart disease and increases the risk  of stroke. Your blood pressure should be checked at least every 1 to 2 years. Ongoing high blood pressure should be treated with medicines if weight loss and exercise do not work.  If you are 55-79 years old, ask your health care provider if you should take aspirin to prevent strokes.  Diabetes screening is done by taking a blood sample to check your blood glucose level after you have not eaten for a certain period of time (fasting). If you are not overweight and you do not have risk factors for diabetes, you should be screened once every 3 years starting at age 45. If you are overweight or obese and you are 40-70 years of age, you should be screened for diabetes every year as part of your cardiovascular risk assessment.  Breast cancer screening is essential preventive care for women. You should practice "breast self-awareness." This means understanding the normal appearance and feel of your breasts and may include breast self-examination. Any changes detected, no matter how small, should be reported to a health care provider. Women in their 20s and 30s should have a clinical breast exam (CBE) by a health care provider as part of a regular health exam every 1 to 3 years. After age 40, women should have a CBE every year. Starting at age 40, women should consider having a mammogram (breast X-ray test) every year. Women who have a family history of breast cancer should talk to their health care provider about genetic screening. Women at a high risk of breast cancer should talk to their health care providers about having an MRI and a mammogram every year.  Breast cancer gene (BRCA)-related cancer risk assessment is recommended for women who have family members with BRCA-related cancers. BRCA-related cancers include breast, ovarian, tubal, and peritoneal cancers. Having family members with these cancers may be associated with an increased risk for harmful changes (mutations) in the breast cancer genes BRCA1 and  BRCA2. Results of the assessment will determine the need for genetic counseling and BRCA1 and BRCA2 testing.  Your health care provider may recommend that you be screened regularly for cancer of the pelvic organs (ovaries, uterus, and vagina). This screening involves a pelvic examination, including checking for microscopic changes to the surface of your cervix (Pap test). You may be encouraged to have this screening done every 3 years, beginning at age 21.  For women ages 30-65, health care providers may recommend pelvic exams and Pap testing every 3 years, or they may recommend the Pap and pelvic exam, combined with testing for human papilloma virus (HPV), every 5 years. Some types of HPV increase your risk of cervical cancer. Testing for HPV may also be done on women of any age with unclear Pap test results.  Other health care providers may not recommend any screening for nonpregnant women who are considered low risk for pelvic cancer and who do not have symptoms. Ask your health care provider if a screening pelvic exam is right for   you.  If you have had past treatment for cervical cancer or a condition that could lead to cancer, you need Pap tests and screening for cancer for at least 20 years after your treatment. If Pap tests have been discontinued, your risk factors (such as having a new sexual partner) need to be reassessed to determine if screening should resume. Some women have medical problems that increase the chance of getting cervical cancer. In these cases, your health care provider may recommend more frequent screening and Pap tests.  Colorectal cancer can be detected and often prevented. Most routine colorectal cancer screening begins at the age of 50 years and continues through age 75 years. However, your health care provider may recommend screening at an earlier age if you have risk factors for colon cancer. On a yearly basis, your health care provider may provide home test kits to check  for hidden blood in the stool. Use of a small camera at the end of a tube, to directly examine the colon (sigmoidoscopy or colonoscopy), can detect the earliest forms of colorectal cancer. Talk to your health care provider about this at age 50, when routine screening begins. Direct exam of the colon should be repeated every 5-10 years through age 75 years, unless early forms of precancerous polyps or small growths are found.  People who are at an increased risk for hepatitis B should be screened for this virus. You are considered at high risk for hepatitis B if:  You were born in a country where hepatitis B occurs often. Talk with your health care provider about which countries are considered high risk.  Your parents were born in a high-risk country and you have not received a shot to protect against hepatitis B (hepatitis B vaccine).  You have HIV or AIDS.  You use needles to inject street drugs.  You live with, or have sex with, someone who has hepatitis B.  You get hemodialysis treatment.  You take certain medicines for conditions like cancer, organ transplantation, and autoimmune conditions.  Hepatitis C blood testing is recommended for all people born from 1945 through 1965 and any individual with known risks for hepatitis C.  Practice safe sex. Use condoms and avoid high-risk sexual practices to reduce the spread of sexually transmitted infections (STIs). STIs include gonorrhea, chlamydia, syphilis, trichomonas, herpes, HPV, and human immunodeficiency virus (HIV). Herpes, HIV, and HPV are viral illnesses that have no cure. They can result in disability, cancer, and death.  You should be screened for sexually transmitted illnesses (STIs) including gonorrhea and chlamydia if:  You are sexually active and are younger than 24 years.  You are older than 24 years and your health care provider tells you that you are at risk for this type of infection.  Your sexual activity has changed  since you were last screened and you are at an increased risk for chlamydia or gonorrhea. Ask your health care provider if you are at risk.  If you are at risk of being infected with HIV, it is recommended that you take a prescription medicine daily to prevent HIV infection. This is called preexposure prophylaxis (PrEP). You are considered at risk if:  You are sexually active and do not regularly use condoms or know the HIV status of your partner(s).  You take drugs by injection.  You are sexually active with a partner who has HIV.  Talk with your health care provider about whether you are at high risk of being infected with HIV. If   you choose to begin PrEP, you should first be tested for HIV. You should then be tested every 3 months for as long as you are taking PrEP.  Osteoporosis is a disease in which the bones lose minerals and strength with aging. This can result in serious bone fractures or breaks. The risk of osteoporosis can be identified using a bone density scan. Women ages 67 years and over and women at risk for fractures or osteoporosis should discuss screening with their health care providers. Ask your health care provider whether you should take a calcium supplement or vitamin D to reduce the rate of osteoporosis.  Menopause can be associated with physical symptoms and risks. Hormone replacement therapy is available to decrease symptoms and risks. You should talk to your health care provider about whether hormone replacement therapy is right for you.  Use sunscreen. Apply sunscreen liberally and repeatedly throughout the day. You should seek shade when your shadow is shorter than you. Protect yourself by wearing long sleeves, pants, a wide-brimmed hat, and sunglasses year round, whenever you are outdoors.  Once a month, do a whole body skin exam, using a mirror to look at the skin on your back. Tell your health care provider of new moles, moles that have irregular borders, moles that  are larger than a pencil eraser, or moles that have changed in shape or color.  Stay current with required vaccines (immunizations).  Influenza vaccine. All adults should be immunized every year.  Tetanus, diphtheria, and acellular pertussis (Td, Tdap) vaccine. Pregnant women should receive 1 dose of Tdap vaccine during each pregnancy. The dose should be obtained regardless of the length of time since the last dose. Immunization is preferred during the 27th-36th week of gestation. An adult who has not previously received Tdap or who does not know her vaccine status should receive 1 dose of Tdap. This initial dose should be followed by tetanus and diphtheria toxoids (Td) booster doses every 10 years. Adults with an unknown or incomplete history of completing a 3-dose immunization series with Td-containing vaccines should begin or complete a primary immunization series including a Tdap dose. Adults should receive a Td booster every 10 years.  Varicella vaccine. An adult without evidence of immunity to varicella should receive 2 doses or a second dose if she has previously received 1 dose. Pregnant females who do not have evidence of immunity should receive the first dose after pregnancy. This first dose should be obtained before leaving the health care facility. The second dose should be obtained 4-8 weeks after the first dose.  Human papillomavirus (HPV) vaccine. Females aged 13-26 years who have not received the vaccine previously should obtain the 3-dose series. The vaccine is not recommended for use in pregnant females. However, pregnancy testing is not needed before receiving a dose. If a female is found to be pregnant after receiving a dose, no treatment is needed. In that case, the remaining doses should be delayed until after the pregnancy. Immunization is recommended for any person with an immunocompromised condition through the age of 61 years if she did not get any or all doses earlier. During the  3-dose series, the second dose should be obtained 4-8 weeks after the first dose. The third dose should be obtained 24 weeks after the first dose and 16 weeks after the second dose.  Zoster vaccine. One dose is recommended for adults aged 30 years or older unless certain conditions are present.  Measles, mumps, and rubella (MMR) vaccine. Adults born  before 1957 generally are considered immune to measles and mumps. Adults born in 1957 or later should have 1 or more doses of MMR vaccine unless there is a contraindication to the vaccine or there is laboratory evidence of immunity to each of the three diseases. A routine second dose of MMR vaccine should be obtained at least 28 days after the first dose for students attending postsecondary schools, health care workers, or international travelers. People who received inactivated measles vaccine or an unknown type of measles vaccine during 1963-1967 should receive 2 doses of MMR vaccine. People who received inactivated mumps vaccine or an unknown type of mumps vaccine before 1979 and are at high risk for mumps infection should consider immunization with 2 doses of MMR vaccine. For females of childbearing age, rubella immunity should be determined. If there is no evidence of immunity, females who are not pregnant should be vaccinated. If there is no evidence of immunity, females who are pregnant should delay immunization until after pregnancy. Unvaccinated health care workers born before 1957 who lack laboratory evidence of measles, mumps, or rubella immunity or laboratory confirmation of disease should consider measles and mumps immunization with 2 doses of MMR vaccine or rubella immunization with 1 dose of MMR vaccine.  Pneumococcal 13-valent conjugate (PCV13) vaccine. When indicated, a person who is uncertain of his immunization history and has no record of immunization should receive the PCV13 vaccine. All adults 65 years of age and older should receive this  vaccine. An adult aged 19 years or older who has certain medical conditions and has not been previously immunized should receive 1 dose of PCV13 vaccine. This PCV13 should be followed with a dose of pneumococcal polysaccharide (PPSV23) vaccine. Adults who are at high risk for pneumococcal disease should obtain the PPSV23 vaccine at least 8 weeks after the dose of PCV13 vaccine. Adults older than 73 years of age who have normal immune system function should obtain the PPSV23 vaccine dose at least 1 year after the dose of PCV13 vaccine.  Pneumococcal polysaccharide (PPSV23) vaccine. When PCV13 is also indicated, PCV13 should be obtained first. All adults aged 65 years and older should be immunized. An adult younger than age 65 years who has certain medical conditions should be immunized. Any person who resides in a nursing home or long-term care facility should be immunized. An adult smoker should be immunized. People with an immunocompromised condition and certain other conditions should receive both PCV13 and PPSV23 vaccines. People with human immunodeficiency virus (HIV) infection should be immunized as soon as possible after diagnosis. Immunization during chemotherapy or radiation therapy should be avoided. Routine use of PPSV23 vaccine is not recommended for American Indians, [place] Natives, or people younger than 65 years unless there are medical conditions that require PPSV23 vaccine. When indicated, people who have unknown immunization and have no record of immunization should receive PPSV23 vaccine. One-time revaccination 5 years after the first dose of PPSV23 is recommended for people aged 19-64 years who have chronic kidney failure, nephrotic syndrome, asplenia, or immunocompromised conditions. People who received 1-2 doses of PPSV23 before age 65 years should receive another dose of PPSV23 vaccine at age 65 years or later if at least 5 years have passed since the previous dose. Doses of PPSV23 are not  needed for people immunized with PPSV23 at or after age 65 years.  Meningococcal vaccine. Adults with asplenia or persistent complement component deficiencies should receive 2 doses of quadrivalent meningococcal conjugate (MenACWY-D) vaccine. The doses should be obtained   at least 2 months apart. Microbiologists working with certain meningococcal bacteria, [place] recruits, people at risk during an outbreak, and people who travel to or live in countries with a high rate of meningitis should be immunized. A first-year college student up through age 34 years who is living in a residence hall should receive a dose if she did not receive a dose on or after her 16th birthday. Adults who have certain high-risk conditions should receive one or more doses of vaccine.  Hepatitis A vaccine. Adults who wish to be protected from this disease, have certain high-risk conditions, work with hepatitis A-infected animals, work in hepatitis A research labs, or travel to or work in countries with a high rate of hepatitis A should be immunized. Adults who were previously unvaccinated and who anticipate close contact with an international adoptee during the first 60 days after arrival in the [place] from a country with a high rate of hepatitis A should be immunized.  Hepatitis B vaccine. Adults who wish to be protected from this disease, have certain high-risk conditions, may be exposed to blood or other infectious body fluids, are household contacts or sex partners of hepatitis B positive people, are clients or workers in certain care facilities, or travel to or work in countries with a high rate of hepatitis B should be immunized.  Haemophilus influenzae type b (Hib) vaccine. A previously unvaccinated person with asplenia or sickle cell disease or having a scheduled splenectomy should receive 1 dose of Hib vaccine. Regardless of previous immunization, a recipient of a hematopoietic stem cell transplant should receive a  3-dose series 6-12 months after her successful transplant. Hib vaccine is not recommended for adults with HIV infection. Preventive Services / Frequency Ages 35 to 4 years  Blood pressure check.** / Every 3-5 years.  Lipid and cholesterol check.** / Every 5 years beginning at age 60.  Clinical breast exam.** / Every 3 years for women in their 71s and 10s.  BRCA-related cancer risk assessment.** / For women who have family members with a BRCA-related cancer (breast, ovarian, tubal, or peritoneal cancers).  Pap test.** / Every 2 years from ages 76 through 26. Every 3 years starting at age 61 through age 76 or 93 with a history of 3 consecutive normal Pap tests.  HPV screening.** / Every 3 years from ages 37 through ages 60 to 51 with a history of 3 consecutive normal Pap tests.  Hepatitis C blood test.** / For any individual with known risks for hepatitis C.  Skin self-exam. / Monthly.  Influenza vaccine. / Every year.  Tetanus, diphtheria, and acellular pertussis (Tdap, Td) vaccine.** / Consult your health care provider. Pregnant women should receive 1 dose of Tdap vaccine during each pregnancy. 1 dose of Td every 10 years.  Varicella vaccine.** / Consult your health care provider. Pregnant females who do not have evidence of immunity should receive the first dose after pregnancy.  HPV vaccine. / 3 doses over 6 months, if 93 and younger. The vaccine is not recommended for use in pregnant females. However, pregnancy testing is not needed before receiving a dose.  Measles, mumps, rubella (MMR) vaccine.** / You need at least 1 dose of MMR if you were born in 1957 or later. You may also need a 2nd dose. For females of childbearing age, rubella immunity should be determined. If there is no evidence of immunity, females who are not pregnant should be vaccinated. If there is no evidence of immunity, females who are  pregnant should delay immunization until after pregnancy.  Pneumococcal  13-valent conjugate (PCV13) vaccine.** / Consult your health care provider.  Pneumococcal polysaccharide (PPSV23) vaccine.** / 1 to 2 doses if you smoke cigarettes or if you have certain conditions.  Meningococcal vaccine.** / 1 dose if you are age 68 to 8 years and a Market researcher living in a residence hall, or have one of several medical conditions, you need to get vaccinated against meningococcal disease. You may also need additional booster doses.  Hepatitis A vaccine.** / Consult your health care provider.  Hepatitis B vaccine.** / Consult your health care provider.  Haemophilus influenzae type b (Hib) vaccine.** / Consult your health care provider. Ages 7 to 53 years  Blood pressure check.** / Every year.  Lipid and cholesterol check.** / Every 5 years beginning at age 25 years.  Lung cancer screening. / Every year if you are aged 11-80 years and have a 30-pack-year history of smoking and currently smoke or have quit within the past 15 years. Yearly screening is stopped once you have quit smoking for at least 15 years or develop a health problem that would prevent you from having lung cancer treatment.  Clinical breast exam.** / Every year after age 48 years.  BRCA-related cancer risk assessment.** / For women who have family members with a BRCA-related cancer (breast, ovarian, tubal, or peritoneal cancers).  Mammogram.** / Every year beginning at age 41 years and continuing for as long as you are in good health. Consult with your health care provider.  Pap test.** / Every 3 years starting at age 65 years through age 37 or 70 years with a history of 3 consecutive normal Pap tests.  HPV screening.** / Every 3 years from ages 72 years through ages 60 to 40 years with a history of 3 consecutive normal Pap tests.  Fecal occult blood test (FOBT) of stool. / Every year beginning at age 21 years and continuing until age 5 years. You may not need to do this test if you get  a colonoscopy every 10 years.  Flexible sigmoidoscopy or colonoscopy.** / Every 5 years for a flexible sigmoidoscopy or every 10 years for a colonoscopy beginning at age 35 years and continuing until age 48 years.  Hepatitis C blood test.** / For all people born from [birth year] through 1965 and any individual with known risks for hepatitis C.  Skin self-exam. / Monthly.  Influenza vaccine. / Every year.  Tetanus, diphtheria, and acellular pertussis (Tdap/Td) vaccine.** / Consult your health care provider. Pregnant women should receive 1 dose of Tdap vaccine during each pregnancy. 1 dose of Td every 10 years.  Varicella vaccine.** / Consult your health care provider. Pregnant females who do not have evidence of immunity should receive the first dose after pregnancy.  Zoster vaccine.** / 1 dose for adults aged 30 years or older.  Measles, mumps, rubella (MMR) vaccine.** / You need at least 1 dose of MMR if you were born in 1957 or later. You may also need a second dose. For females of childbearing age, rubella immunity should be determined. If there is no evidence of immunity, females who are not pregnant should be vaccinated. If there is no evidence of immunity, females who are pregnant should delay immunization until after pregnancy.  Pneumococcal 13-valent conjugate (PCV13) vaccine.** / Consult your health care provider.  Pneumococcal polysaccharide (PPSV23) vaccine.** / 1 to 2 doses if you smoke cigarettes or if you have certain conditions.  Meningococcal vaccine.** /  Consult your health care provider.  Hepatitis A vaccine.** / Consult your health care provider.  Hepatitis B vaccine.** / Consult your health care provider.  Haemophilus influenzae type b (Hib) vaccine.** / Consult your health care provider. Ages 64 years and over  Blood pressure check.** / Every year.  Lipid and cholesterol check.** / Every 5 years beginning at age 23 years.  Lung cancer screening. / Every year if you  are aged 16-80 years and have a 30-pack-year history of smoking and currently smoke or have quit within the past 15 years. Yearly screening is stopped once you have quit smoking for at least 15 years or develop a health problem that would prevent you from having lung cancer treatment.  Clinical breast exam.** / Every year after age 74 years.  BRCA-related cancer risk assessment.** / For women who have family members with a BRCA-related cancer (breast, ovarian, tubal, or peritoneal cancers).  Mammogram.** / Every year beginning at age 44 years and continuing for as long as you are in good health. Consult with your health care provider.  Pap test.** / Every 3 years starting at age 58 years through age 22 or 39 years with 3 consecutive normal Pap tests. Testing can be stopped between 65 and 70 years with 3 consecutive normal Pap tests and no abnormal Pap or HPV tests in the past 10 years.  HPV screening.** / Every 3 years from ages 64 years through ages 70 or 61 years with a history of 3 consecutive normal Pap tests. Testing can be stopped between 65 and 70 years with 3 consecutive normal Pap tests and no abnormal Pap or HPV tests in the past 10 years.  Fecal occult blood test (FOBT) of stool. / Every year beginning at age 40 years and continuing until age 27 years. You may not need to do this test if you get a colonoscopy every 10 years.  Flexible sigmoidoscopy or colonoscopy.** / Every 5 years for a flexible sigmoidoscopy or every 10 years for a colonoscopy beginning at age 7 years and continuing until age 32 years.  Hepatitis C blood test.** / For all people born from [birth year] through 1963 and any individual with known risks for hepatitis C.  Osteoporosis screening.** / A one-time screening for women ages 30 years and over and women at risk for fractures or osteoporosis.  Skin self-exam. / Monthly.  Influenza vaccine. / Every year.  Tetanus, diphtheria, and acellular pertussis (Tdap/Td)  vaccine.** / 1 dose of Td every 10 years.  Varicella vaccine.** / Consult your health care provider.  Zoster vaccine.** / 1 dose for adults aged 35 years or older.  Pneumococcal 13-valent conjugate (PCV13) vaccine.** / Consult your health care provider.  Pneumococcal polysaccharide (PPSV23) vaccine.** / 1 dose for all adults aged 46 years and older.  Meningococcal vaccine.** / Consult your health care provider.  Hepatitis A vaccine.** / Consult your health care provider.  Hepatitis B vaccine.** / Consult your health care provider.  Haemophilus influenzae type b (Hib) vaccine.** / Consult your health care provider. ** Family history and personal history of risk and conditions may change your health care provider's recommendations.   This information is not intended to replace advice given to you by your health care provider. Make sure you discuss any questions you have with your health care provider.   Document Released: 09/08/2001 Document Revised: 08/03/2014 Document Reviewed: 12/08/2010 Elsevier Interactive Patient Education [employer].

## 2016-03-16 NOTE — Progress Notes (Signed)
Pre visit review using our clinic review tool, if applicable. No additional management support is needed unless otherwise documented below in the visit note. 

## 2016-03-16 NOTE — Progress Notes (Signed)
Subjective:  Patient ID: Stacey Navarro, female    DOB: 19-May-1941  Age: 75 y.o. MRN: [id number]  CC: Annual Exam; Hyperlipidemia; and Gastroesophageal Reflux   HPI Stacey Navarro presents for a CPX.  She complains of heartburn during bedtime over the last few weeks. She ran out of Protonix a couple months ago and has tried an over-the-counter PPI without much relief from her symptoms. She denies odynophagia, dysphagia, loss of appetite, or weight loss.  Past Medical History:  Diagnosis Date  . Arthritis 2010   OA in knees  . Breast cancer of upper-inner quadrant of right female breast ([place]) 10/23/2014  . Carpal tunnel syndrome 02/12/2015   Right  . GERD (gastroesophageal reflux disease)   . Hyperlipidemia   . Primary localized osteoarthritis of left knee 07/12/2015  . Primary localized osteoarthritis of right knee 05/03/2015  . S/P radiation therapy 12/26/2014 through 01/29/2015    Right breast 4800 cGy in 24 sessions    Past Surgical History:  Procedure Laterality Date  . CHOLECYSTECTOMY  2011  . DILATION AND CURETTAGE OF UTERUS    . PARTIAL KNEE ARTHROPLASTY Right 05/03/2015   Procedure: RIGHT UNICOMPARTMENTAL KNEE;  Surgeon: Marchia Bond, MD;  Location: [facility];  Service: Orthopedics;  Laterality: Right;  . PARTIAL KNEE ARTHROPLASTY Left 07/12/2015   Procedure: LEFT UNICOMPARTMENTAL KNEE;  Surgeon: Marchia Bond, MD;  Location: [facility];  Service: Orthopedics;  Laterality: Left;  . RADIOACTIVE SEED GUIDED MASTECTOMY WITH AXILLARY SENTINEL LYMPH NODE BIOPSY Right 11/13/2014   Procedure: RIGHT PARTIAL MASTECTOMY WITH RADIOACTIVE SEED LOCALIZATION RIGHT  AXILLARY SENTINEL LYMPH NODE BIOPSY;  Surgeon: [name], MD;  Location: [facility];  Service: General;  Laterality: Right;  . TONSILLECTOMY  1948  . TUBAL LIGATION      reports that she quit smoking  about 20 years ago. Her smoking use included Cigarettes. She smoked 1.00 pack per day. She has never used smokeless tobacco. She reports that she drinks about 1.2 oz of alcohol per week . She reports that she does not use drugs. family history includes Alcohol abuse in her father; Breast cancer in her maternal aunt; Hypertension in her mother. No Known Allergies  Outpatient Medications Prior to Visit  Medication Sig Dispense Refill  . anastrozole (ARIMIDEX) 1 MG tablet Take 1 tablet (1 mg total) by mouth daily. 90 tablet 3  . baclofen (LIORESAL) 10 MG tablet Take 1 tablet (10 mg total) by mouth 3 (three) times daily. As needed for muscle spasm 50 tablet 0  . calcium carbonate (OS-CAL) 1250 (500 CA) MG chewable tablet Chew 1 tablet by mouth daily.    . cholecalciferol (VITAMIN D) 1000 UNITS tablet Take 1,000 Units by mouth daily.    . Omega-3 Fatty Acids (FISH OIL) 1000 MG CPDR Take by mouth.    . vitamin E 400 UNIT capsule Take 400 Units by mouth daily.    . Glucosamine-Chondroit-Vit C-Mn (GLUCOSAMINE 1500 COMPLEX PO) Take by mouth.    . ondansetron (ZOFRAN) 4 MG tablet Take 1 tablet (4 mg total) by mouth every 8 (eight) hours as needed for nausea or vomiting. 30 tablet 0  . oxyCODONE-acetaminophen (ROXICET) 5-325 MG tablet Take 1-2 tablets by mouth every 6 (six) hours as needed for severe pain. 50 tablet 0  . pantoprazole (PROTONIX) 40 MG tablet Take 1 tablet (40 mg total) by mouth daily. Overdue for yearly physical w/labs must see MD for refills 30 tablet 0  . rivaroxaban (  XARELTO) 10 MG TABS tablet Take 1 tablet (10 mg total) by mouth daily. 21 tablet 0  . sennosides-docusate sodium (SENOKOT-S) 8.6-50 MG tablet Take 2 tablets by mouth daily. 30 tablet 1   No facility-administered medications prior to visit.     ROS Review of Systems  Constitutional: Negative.  Negative for activity change, appetite change, diaphoresis, fatigue and unexpected weight change.  HENT: Negative.  Negative for  postnasal drip, sore throat and trouble swallowing.   Eyes: Negative.  Negative for visual disturbance.  Respiratory: Negative.  Negative for cough, choking, chest tightness, shortness of breath and stridor.   Cardiovascular: Negative.  Negative for chest pain, palpitations and leg swelling.  Gastrointestinal: Negative.  Negative for abdominal pain, blood in stool, constipation, diarrhea, nausea and vomiting.  Endocrine: Negative.   Genitourinary: Negative.  Negative for difficulty urinating.  Musculoskeletal: Negative.  Negative for arthralgias, back pain, myalgias and neck pain.  Skin: Negative.  Negative for color change.  Allergic/Immunologic: Negative.   Neurological: Negative.  Negative for dizziness, tremors, weakness, light-headedness and headaches.  Hematological: Negative.  Negative for adenopathy. Does not bruise/bleed easily.  Psychiatric/Behavioral: Negative.     Objective:  BP 130/88 (BP Location: Left Arm, Patient Position: Sitting, Cuff Size: Normal)   Pulse 91   Temp 98.8 F (37.1 C) (Oral)   Ht 5' 6"  (1.676 m)   Wt 215 lb 6 oz (97.7 kg)   SpO2 96%   BMI 34.76 kg/m   BP Readings from Last 3 Encounters:  03/16/16 130/88  12/10/15 137/72  07/12/15 126/70    Wt Readings from Last 3 Encounters:  03/16/16 215 lb 6 oz (97.7 kg)  12/10/15 214 lb 11.2 oz (97.4 kg)  07/12/15 213 lb (96.6 kg)    Physical Exam  Constitutional: She is oriented to person, place, and time. She appears well-developed and well-nourished. No distress.  HENT:  Head: Normocephalic and atraumatic.  Mouth/Throat: Oropharynx is clear and moist. No oropharyngeal exudate.  Eyes: Conjunctivae are normal. Right eye exhibits no discharge. Left eye exhibits no discharge. No scleral icterus.  Neck: Normal range of motion. Neck supple. No JVD present. No tracheal deviation present. No thyromegaly present.  Cardiovascular: Normal rate, regular rhythm, normal heart sounds and intact distal pulses.  Exam  reveals no gallop and no friction rub.   No murmur heard. Pulmonary/Chest: Effort normal and breath sounds normal. No stridor. No respiratory distress. She has no wheezes. She has no rales. She exhibits no tenderness.  Abdominal: Soft. Bowel sounds are normal. She exhibits no distension and no mass. There is no tenderness. There is no rebound and no guarding.  Musculoskeletal: Normal range of motion. She exhibits no edema, tenderness or deformity.  Lymphadenopathy:    She has no cervical adenopathy.  Neurological: She is oriented to person, place, and time.  Skin: Skin is warm and dry. No rash noted. She is not diaphoretic. No erythema. No pallor.  Psychiatric: She has a normal mood and affect. Her behavior is normal. Judgment and thought content normal.  Vitals reviewed.   Lab Results  Component Value Date   WBC 6.8 03/16/2016   HGB 15.7 (H) 03/16/2016   HCT 45.8 03/16/2016   PLT 298.0 03/16/2016   GLUCOSE 108 (H) 03/16/2016   CHOL 275 (H) 03/16/2016   TRIG 121.0 03/16/2016   HDL 71.70 03/16/2016   LDLDIRECT 158.4 07/17/2011   LDLCALC 179 (H) 03/16/2016   ALT 48 (H) 03/16/2016   AST 34 03/16/2016   NA 139  03/16/2016   K 4.1 03/16/2016   CL 101 03/16/2016   CREATININE 1.01 03/16/2016   BUN 21 03/16/2016   CO2 27 03/16/2016   TSH 4.00 03/16/2016   HGBA1C 5.9 03/16/2016    Mm Diag Breast Tomo Bilateral  Result Date: 10/30/2015 CLINICAL DATA:  History of right breast cancer status post lumpectomy 2016. EXAM: 2D DIGITAL DIAGNOSTIC BILATERAL MAMMOGRAM WITH CAD AND ADJUNCT TOMO COMPARISON:  Previous exam(s). ACR Breast Density Category b: There are scattered areas of fibroglandular density. FINDINGS: Lumpectomy changes are seen in the right breast. No suspicious mass or malignant type microcalcifications identified in either breast. Mammographic images were processed with CAD. IMPRESSION: No evidence of malignancy in either breast. RECOMMENDATION: Bilateral diagnostic mammogram in 1  year is recommended. I have discussed the findings and recommendations with the patient. Results were also provided in writing at the conclusion of the visit. If applicable, a reminder letter will be sent to the patient regarding the next appointment. BI-RADS CATEGORY  2: Benign. Electronically Signed   By: Lillia Mountain M.D.   On: 10/30/2015 10:36    Assessment & Plan:   Siniya was seen today for annual exam, hyperlipidemia and gastroesophageal reflux.  Diagnoses and all orders for this visit:  Gastroesophageal reflux disease with esophagitis- will restart a prescription strength PPI -     CBC with Differential/Platelet; Future  Hyperglycemia- she has mild prediabetes, she agrees to work on her lifestyle modifications. -     Comprehensive metabolic panel; Future -     Hemoglobin A1c; Future  Hyperlipidemia with target LDL less than 130- she has a slightly elevated LDL but she also has a very high HDL and low Framingham risk score, I therefore not recommended that she start a statin. -     Lipid panel; Future -     TSH; Future  Routine general medical examination at a health care facility -     Zoster Vaccine Live, PF, (ZOSTAVAX) 35329 UNT/0.65ML injection; Inject 19,400 Units into the skin once.  Need for prophylactic vaccination against Streptococcus pneumoniae (pneumococcus) -     Pneumococcal polysaccharide vaccine 23-valent greater than or equal to 2yo subcutaneous/IM  Other orders -     pantoprazole (PROTONIX) 40 MG tablet; Take 1 tablet (40 mg total) by mouth daily.   I have discontinued Ms. Phaneuf's Glucosamine-Chondroit-Vit C-Mn (GLUCOSAMINE 1500 COMPLEX PO), oxyCODONE-acetaminophen, ondansetron, sennosides-docusate sodium, and rivaroxaban. I have also changed her pantoprazole. Additionally, I am having her start on Zoster Vaccine Live (PF). Lastly, I am having her maintain her cholecalciferol, vitamin E, calcium carbonate, Fish Oil, baclofen, anastrozole, and meloxicam.  Meds  ordered this encounter  Medications  . meloxicam (MOBIC) 7.5 MG tablet    Sig: Take by mouth.  . pantoprazole (PROTONIX) 40 MG tablet    Sig: Take 1 tablet (40 mg total) by mouth daily.    Dispense:  90 tablet    Refill:  1  . Zoster Vaccine Live, PF, (ZOSTAVAX) 92426 UNT/0.65ML injection    Sig: Inject 19,400 Units into the skin once.    Dispense:  1 each    Refill:  0   See AVS for instructions about healthy living and anticipatory guidance.  Follow-up: Return in about 6 months (around 09/16/2016).  [name], MD

## 2016-06-02 ENCOUNTER — Ambulatory Visit: Payer: Medicare Other | Admitting: Hematology and Oncology

## 2016-06-22 NOTE — Assessment & Plan Note (Signed)
Rt Lumpectomy 11/14/14: IDC Grade 3, 2.7 cm size, 0/3 LN , Margins clear; T2N0 Stage IIA Right breast invasive ductal carcinoma diagnosed with screening mammogram, 3.1 cm by ultrasound in the upper quadrant 6 cm from the nipple, grade 2-3, EF 100%, PR 48%, HER-2 negative ratio 0.6, Ki-67 was 28%, Oncotype DX score 19, 12% risk of recurrence, status post radiation 4800 cGy in 24 sessions completed 01/29/2015  Treatment plan: antiestrogen therapy with anastrozole 1 mg daily started 02/14/2015  Anastrozole toxicities: Tolerating it very well without any side effects  Breast Cancer Surveillance: 1. Breast exam 06/23/2016: Normal 2. Mammograms 10/30/2015:No concern for cancer, breast density category C  Return to clinic in 1 year

## 2016-06-23 ENCOUNTER — Encounter: Payer: Self-pay | Admitting: Hematology and Oncology

## 2016-06-23 ENCOUNTER — Ambulatory Visit (HOSPITAL_BASED_OUTPATIENT_CLINIC_OR_DEPARTMENT_OTHER): Payer: Medicare Other | Admitting: Hematology and Oncology

## 2016-06-23 DIAGNOSIS — Z79811 Long term (current) use of aromatase inhibitors: Secondary | ICD-10-CM | POA: Diagnosis not present

## 2016-06-23 DIAGNOSIS — Z170 Estrogen receptor positive status [ER+]: Secondary | ICD-10-CM

## 2016-06-23 DIAGNOSIS — C50211 Malignant neoplasm of upper-inner quadrant of right female breast: Secondary | ICD-10-CM | POA: Diagnosis not present

## 2016-06-23 NOTE — Progress Notes (Signed)
Patient Care Team: [name], MD as PCP - General (Internal Medicine) [name], MD as Consulting Physician (General Surgery) [name], MD as Consulting Physician (Hematology and Oncology) [name], MD as Consulting Physician (Radiation Oncology) Mauro Kaufmann, RN as Registered Nurse Rockwell Germany, RN as Registered Nurse Holley Bouche, NP as Nurse Practitioner (Nurse Practitioner) Marchia Bond, MD as Consulting Physician (Orthopedic Surgery) Sylvan Cheese, NP as Nurse Practitioner (Hematology and Oncology)  DIAGNOSIS:  Encounter Diagnosis  Name Primary?  . Malignant neoplasm of upper-inner quadrant of right breast in female, estrogen receptor positive ([place])     SUMMARY OF ONCOLOGIC HISTORY:   Breast cancer of upper-inner quadrant of right female breast ([place])   10/19/2014 Mammogram    Right breast mass 3.1 cm upper quadrant, 6 cm from the nipple      10/19/2014 Initial Biopsy    Right breast core needle bx: invasive ductal carcinoma, grade 2-3, ER+ (100%), PR+ (48%), HER-2 negative (ratio 0.6), Ki-67 28%      10/20/2014 Clinical Stage    Stage IIA: T2 N0      11/13/2014 Definitive Surgery    Right Lumpectomy/SLNB Dalbert Batman): IDC, grade 3, 2.7 cm size, 3 LN removed and negative for malignancy (0/3) margins clear. HER2/neu repeated and remains negative (ratio 0.88).      11/13/2014 Pathologic Stage    Stage IIA: pT2 pN0      11/13/2014 Oncotype testing    Score 19 (12% ROR). No chemotherapy Lindi Adie).      12/26/2014 - 01/29/2015 Radiation Therapy    Adjuvant RT Valere Dross): Right breast 48 Gy over 24 sessions      02/14/2015 -  Anti-estrogen oral therapy    Anastrozole 1 mg daily. Planned duration of therapy 5 years [place])       Survivorship    Survivorship care plan completed and mailed to patient in lieu of inperson visit.       CHIEF COMPLIANT: Follow-up on anastrozole therapy  INTERVAL HISTORY: Stacey Navarro is a 75 year old  with above-mentioned history of right breast cancer treated with lumpectomy followed by adjuvant radiation therapy. She had Oncotype DX recurrence score of 19 and did not require chemotherapy. She is currently on anastrozole since July 2016. She is tolerating anastrozole extremely well without any hot flashes or myalgias. She denies any lumps or nodules in breast.  REVIEW OF SYSTEMS:   Constitutional: Denies fevers, chills or abnormal weight loss Eyes: Denies blurriness of vision Ears, nose, mouth, throat, and face: Denies mucositis or sore throat Respiratory: Denies cough, dyspnea or wheezes Cardiovascular: Denies palpitation, chest discomfort Gastrointestinal:  Denies nausea, heartburn or change in bowel habits Skin: Denies abnormal skin rashes Lymphatics: Denies new lymphadenopathy or easy bruising Neurological:Denies numbness, tingling or new weaknesses Behavioral/Psych: Mood is stable, no new changes  Extremities: No lower extremity edema Breast:  denies any pain or lumps or nodules in either breasts All other systems were reviewed with the patient and are negative.  I have reviewed the past medical history, past surgical history, social history and family history with the patient and they are unchanged from previous note.  ALLERGIES:  has No Known Allergies.  MEDICATIONS:  Current Outpatient Prescriptions  Medication Sig Dispense Refill  . anastrozole (ARIMIDEX) 1 MG tablet Take 1 tablet (1 mg total) by mouth daily. 90 tablet 3  . baclofen (LIORESAL) 10 MG tablet Take 1 tablet (10 mg total) by mouth 3 (three) times daily. As needed for muscle spasm  50 tablet 0  . calcium carbonate (OS-CAL) 1250 (500 CA) MG chewable tablet Chew 1 tablet by mouth daily.    . cholecalciferol (VITAMIN D) 1000 UNITS tablet Take 1,000 Units by mouth daily.    . meloxicam (MOBIC) 7.5 MG tablet Take by mouth.    . Omega-3 Fatty Acids (FISH OIL) 1000 MG CPDR Take by mouth.    . pantoprazole (PROTONIX) 40  MG tablet Take 1 tablet (40 mg total) by mouth daily. 90 tablet 1  . vitamin E 400 UNIT capsule Take 400 Units by mouth daily.     No current facility-administered medications for this visit.     PHYSICAL EXAMINATION: ECOG PERFORMANCE STATUS: 0 - Asymptomatic  Vitals:   06/23/16 1016  BP: 130/66  Pulse: 72  Resp: 18  Temp: 98.1 F (36.7 C)   Filed Weights   06/23/16 1016  Weight: 226 lb 14.4 oz (102.9 kg)    GENERAL:alert, no distress and comfortable SKIN: skin color, texture, turgor are normal, no rashes or significant lesions EYES: normal, Conjunctiva are pink and non-injected, sclera clear OROPHARYNX:no exudate, no erythema and lips, buccal mucosa, and tongue normal  NECK: supple, thyroid normal size, non-tender, without nodularity LYMPH:  no palpable lymphadenopathy in the cervical, axillary or inguinal LUNGS: clear to auscultation and percussion with normal breathing effort HEART: regular rate & rhythm and no murmurs and no lower extremity edema ABDOMEN:abdomen soft, non-tender and normal bowel sounds MUSCULOSKELETAL:no cyanosis of digits and no clubbing  NEURO: alert & oriented x 3 with fluent speech, no focal motor/sensory deficits EXTREMITIES: No lower extremity edema BREAST: No palpable masses or nodules in either right or left breasts. No palpable axillary supraclavicular or infraclavicular adenopathy no breast tenderness or nipple discharge. (exam performed in the presence of a chaperone)  LABORATORY DATA:  I have reviewed the data as listed   Chemistry      Component Value Date/Time   NA 139 03/16/2016 0855   NA 142 10/31/2014 0818   K 4.1 03/16/2016 0855   K 4.6 10/31/2014 0818   CL 101 03/16/2016 0855   CO2 27 03/16/2016 0855   CO2 24 10/31/2014 0818   BUN 21 03/16/2016 0855   BUN 18.9 10/31/2014 0818   CREATININE 1.01 03/16/2016 0855   CREATININE 1.0 10/31/2014 0818      Component Value Date/Time   CALCIUM 10.5 03/16/2016 0855   CALCIUM 9.4  10/31/2014 0818   ALKPHOS 126 (H) 03/16/2016 0855   ALKPHOS 112 10/31/2014 0818   AST 34 03/16/2016 0855   AST 33 10/31/2014 0818   ALT 48 (H) 03/16/2016 0855   ALT 48 10/31/2014 0818   BILITOT 0.6 03/16/2016 0855   BILITOT 0.41 10/31/2014 0818       Lab Results  Component Value Date   WBC 6.8 03/16/2016   HGB 15.7 (H) 03/16/2016   HCT 45.8 03/16/2016   MCV 94.7 03/16/2016   PLT 298.0 03/16/2016   NEUTROABS 4.3 03/16/2016    ASSESSMENT & PLAN:  Breast cancer of upper-inner quadrant of right female breast Rt Lumpectomy 11/14/14: IDC Grade 3, 2.7 cm size, 0/3 LN , Margins clear; T2N0 Stage IIA Right breast invasive ductal carcinoma diagnosed with screening mammogram, 3.1 cm by ultrasound in the upper quadrant 6 cm from the nipple, grade 2-3, EF 100%, PR 48%, HER-2 negative ratio 0.6, Ki-67 was 28%, Oncotype DX score 19, 12% risk of recurrence, status post radiation 4800 cGy in 24 sessions completed 01/29/2015  Treatment plan: antiestrogen therapy  with anastrozole 1 mg daily started 02/14/2015  Anastrozole toxicities: Tolerating it very well without any side effects  Breast Cancer Surveillance: 1. Breast exam 06/23/2016: Normal 2. Mammograms 10/30/2015:No concern for cancer, breast density category C  Return to clinic in 1 year   No orders of the defined types were placed in this encounter.  The patient has a good understanding of the overall plan. she agrees with it. she will call with any problems that may develop before the next visit here.   Rulon Eisenmenger, MD 06/23/16

## 2016-07-08 IMAGING — MG MM SCREEN MAMMOGRAM BILATERAL
5 series · 5 of 5 positions shown · non-contrast
Comparison: Prior examination from 5991 has been purged.

CLINICAL DATA: Screening.

EXAM:
DIGITAL SCREENING BILATERAL MAMMOGRAM WITH CAD

[R CC]
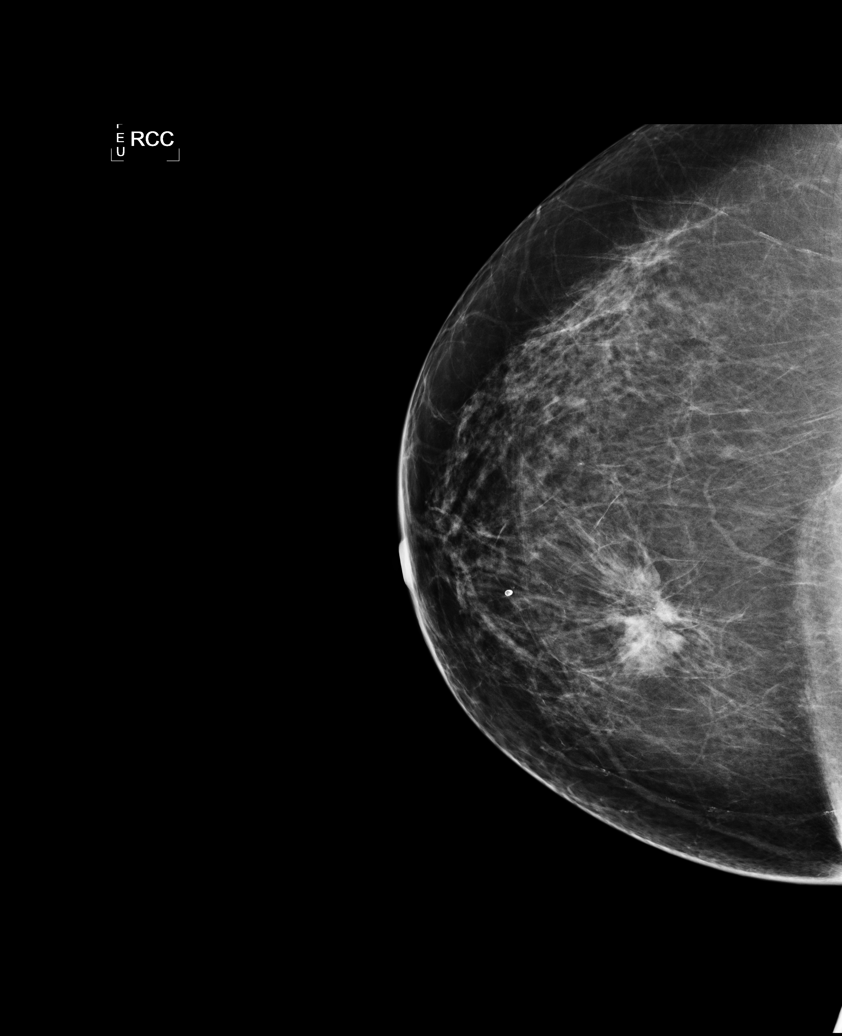

[L CC]
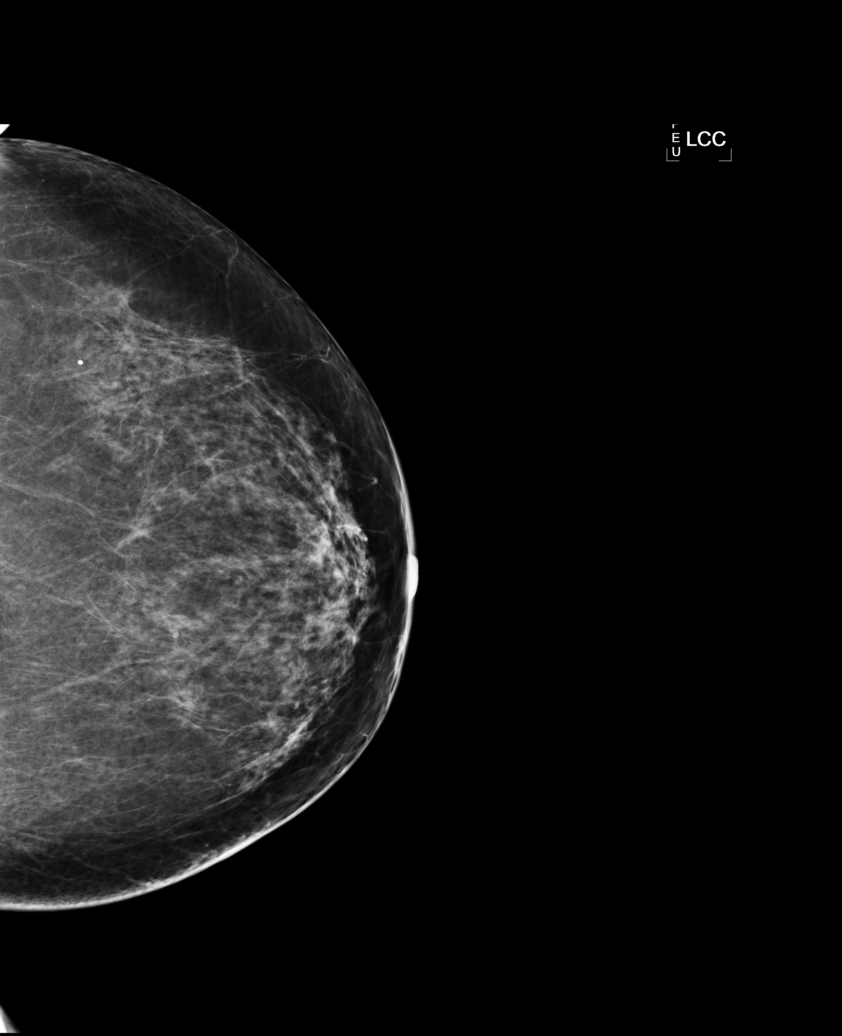

[L MLO (1 of 2)]
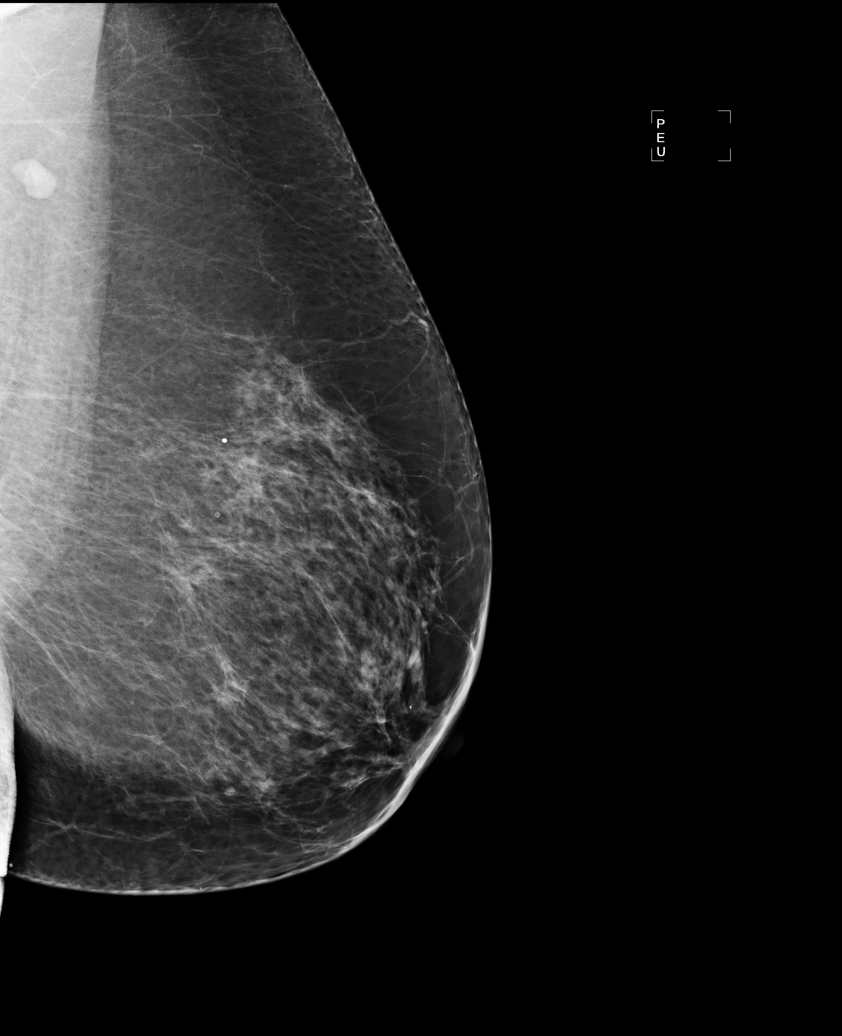

[R MLO]
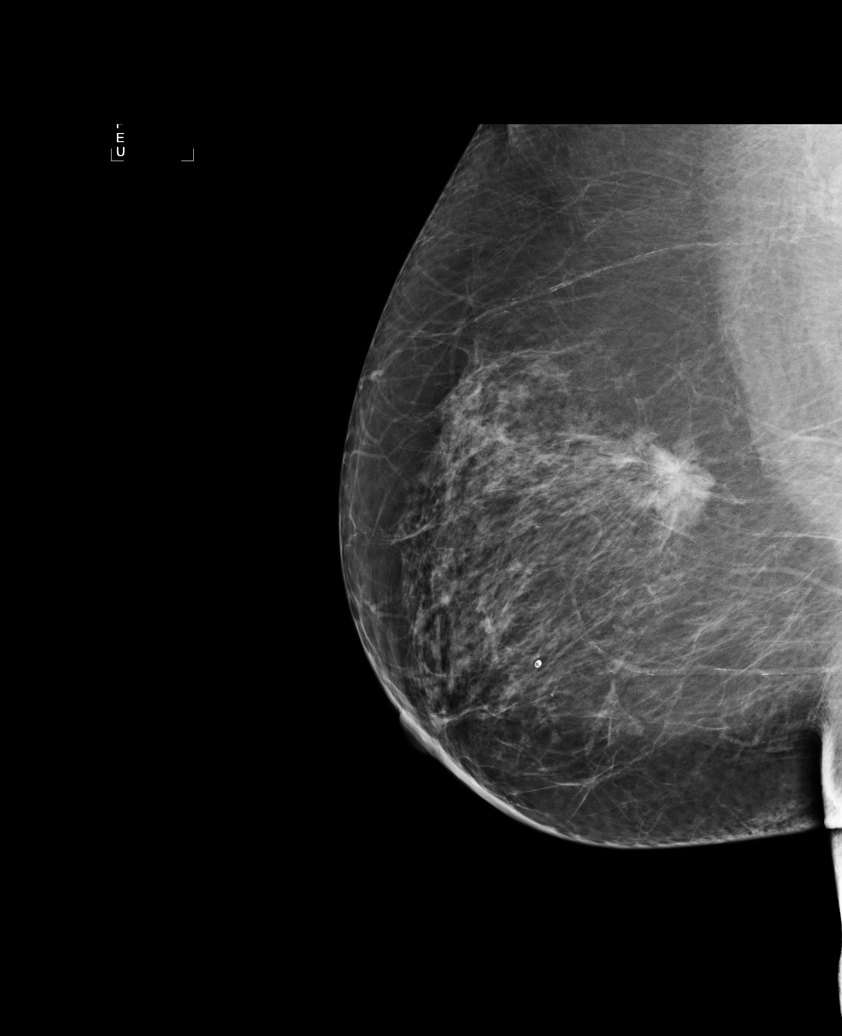

[L MLO (2 of 2)]
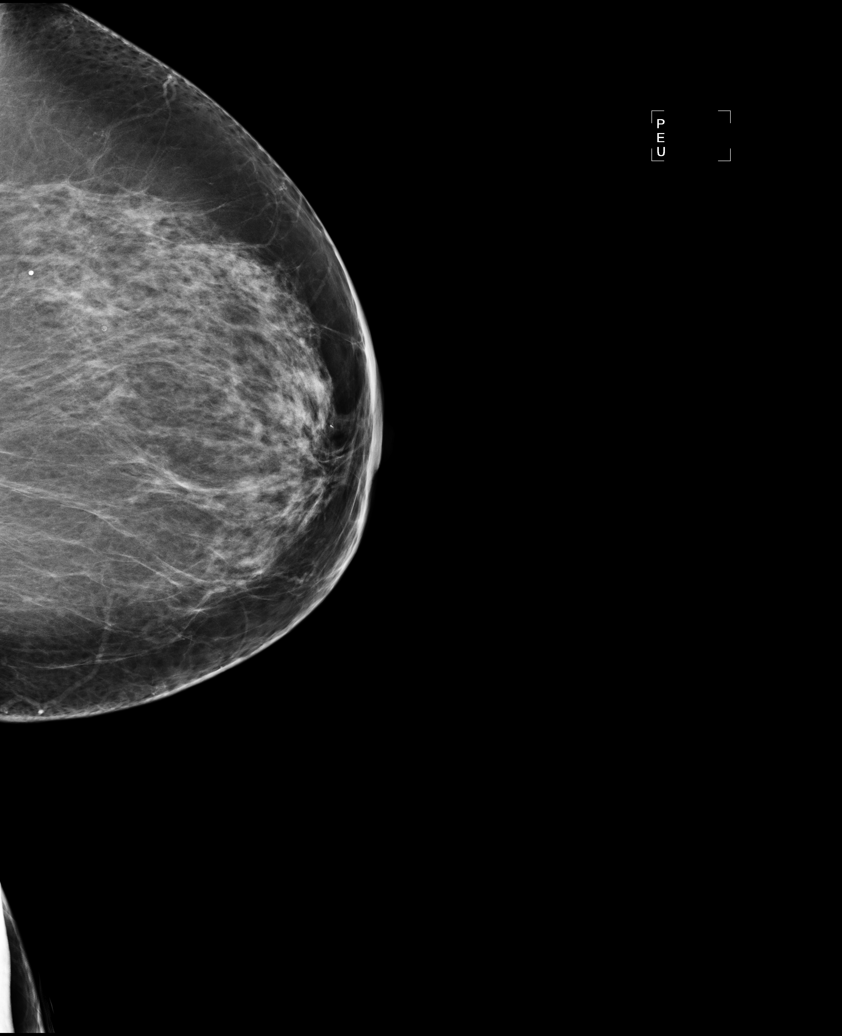

[5 of 5 positions shown; findings below may reference images not displayed]

No more
recent comparison examination.

ACR Breast Density Category b: There are scattered areas of
fibroglandular density.
FINDINGS: In the right breast, a possible mass warrants further evaluation. In
the left breast, no findings suspicious for malignancy. Images were
processed with CAD.
IMPRESSION: Further evaluation is suggested for possible mass in the right
breast.

RECOMMENDATION:
Diagnostic mammogram and possibly ultrasound of the right breast.
(Code:[id number])

The patient will be contacted regarding the findings, and additional
imaging will be scheduled.

BI-RADS CATEGORY  0: Incomplete. Need additional imaging evaluation
and/or prior mammograms for comparison.

## 2016-07-10 IMAGING — US US BREAST LTD UNI RIGHT INC AXILLA
1 series · 13 of 25 positions shown · non-contrast
Comparison: None.

CLINICAL DATA: Patient is an asymptomatic 74-year-old female who
was recalled from screening mammography to further evaluate the
right breast.

EXAM:
ULTRASOUND OF THE RIGHT BREAST

[Series 1: us breast ltd uni right inc axilla · 13 of 25 slices shown]
[im 1/25]
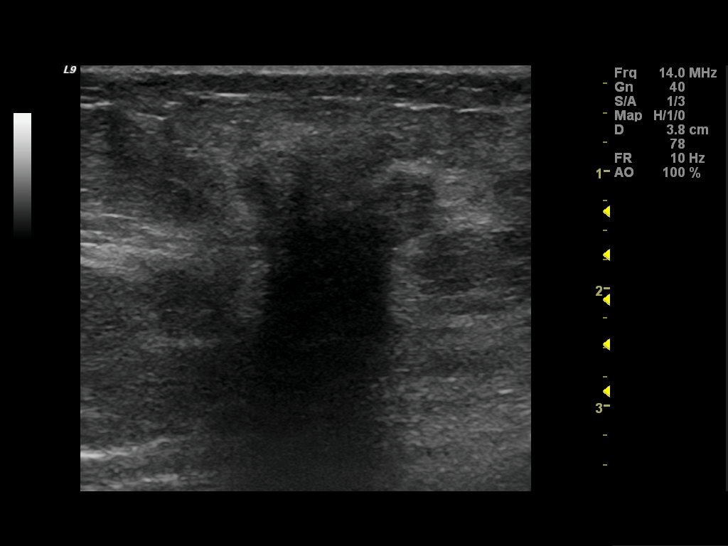
[im 3/25]
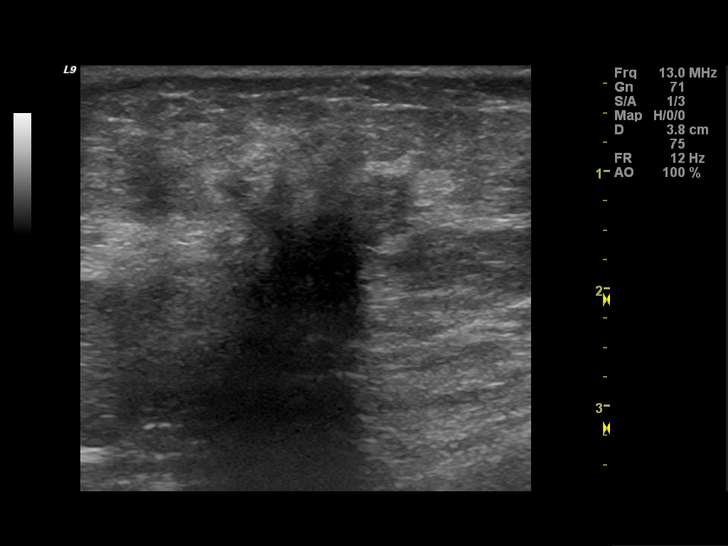
[im 5/25]
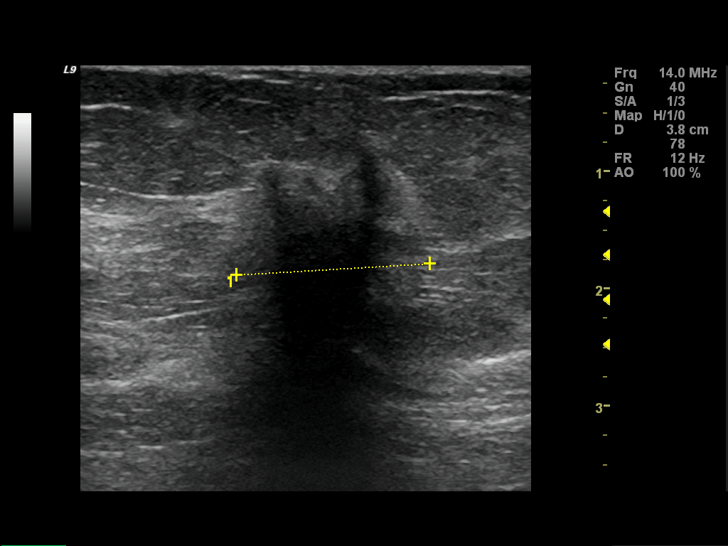
[im 7/25]
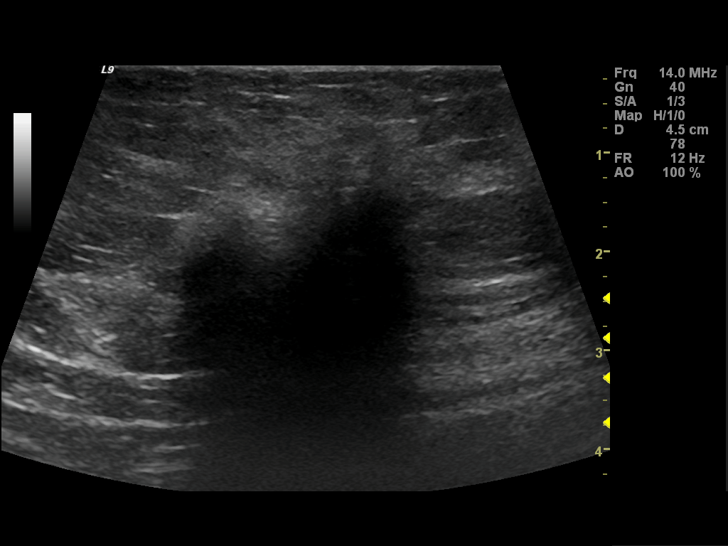
[im 9/25]
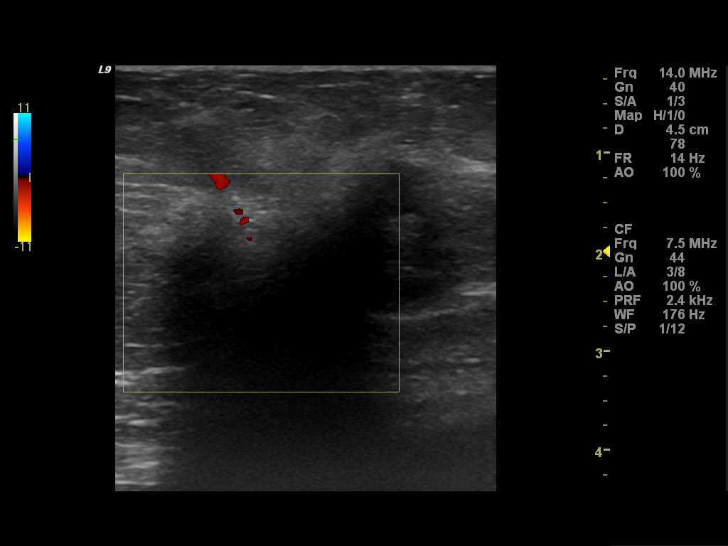
[im 11/25]
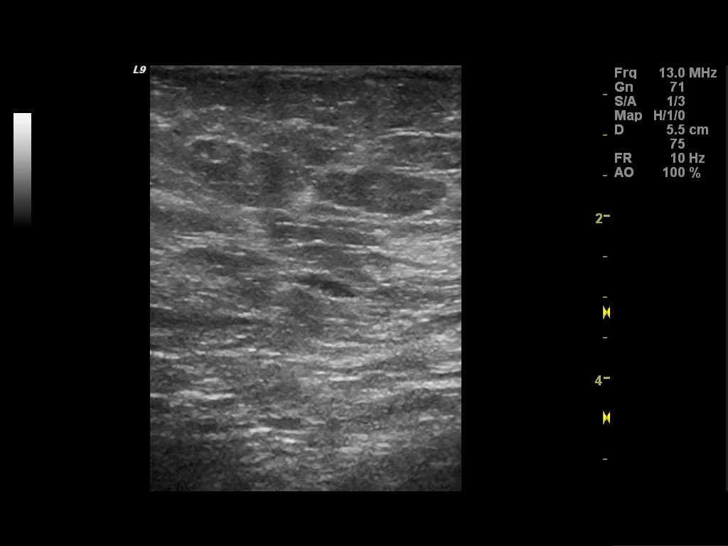
[im 13/25]
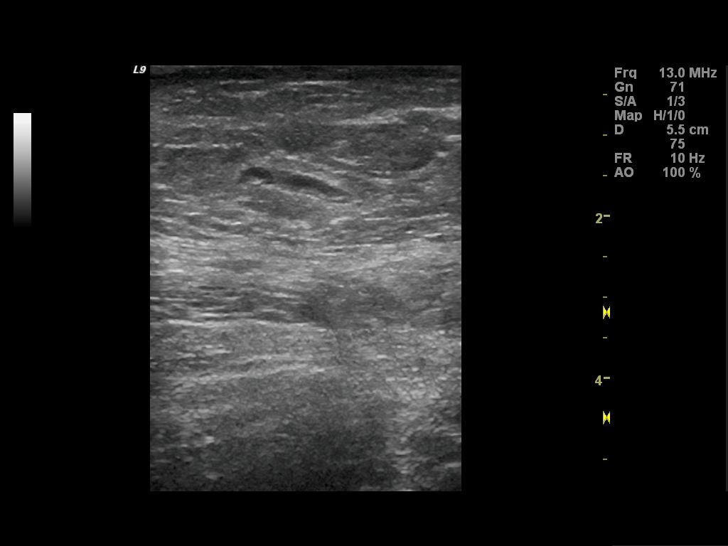
[im 15/25]
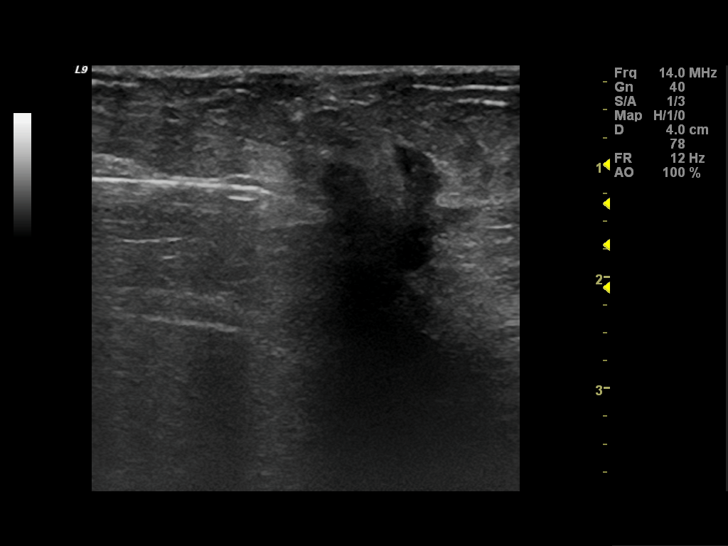
[im 17/25]
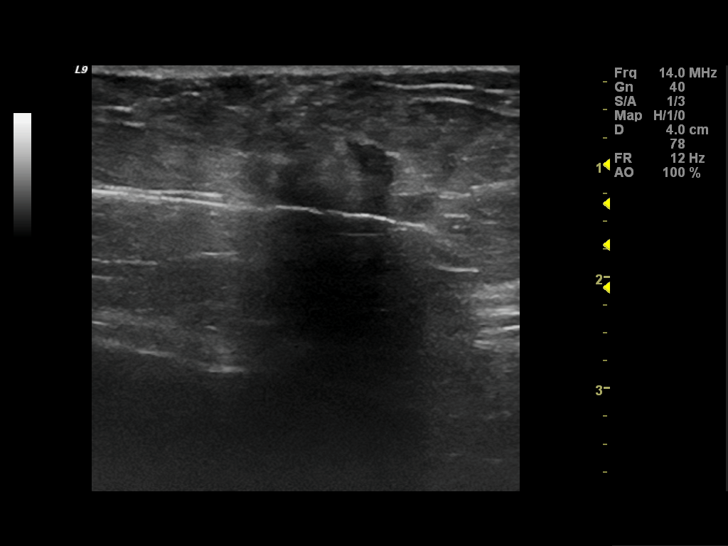
[im 19/25]
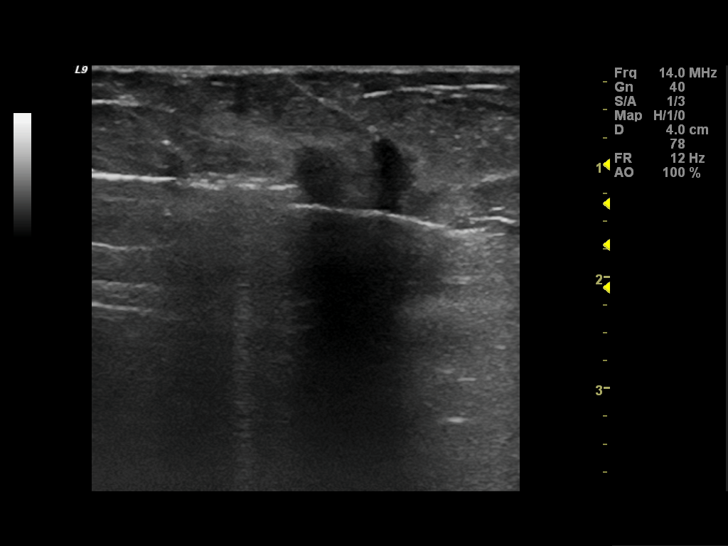
[im 21/25]
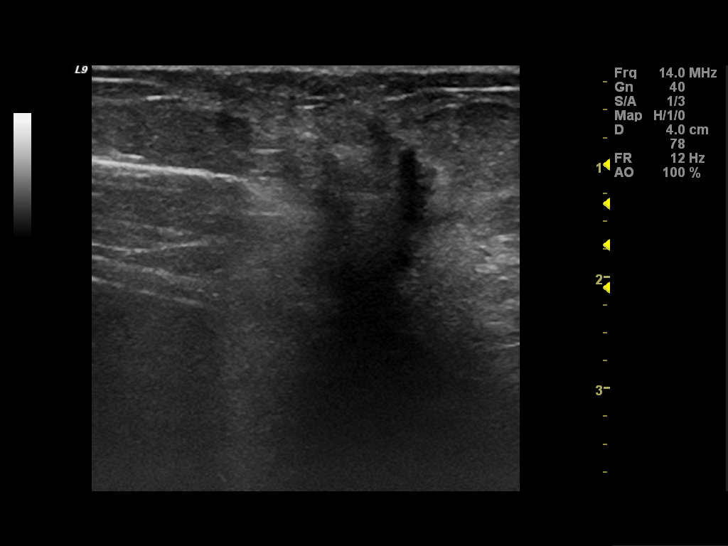
[im 23/25]
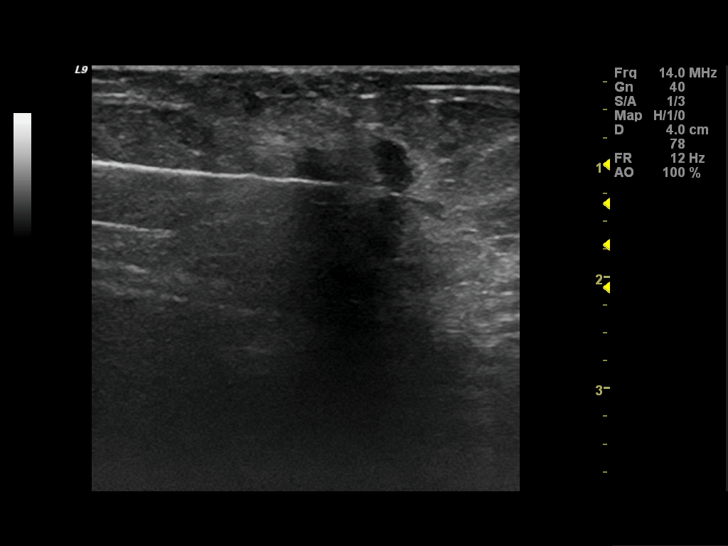
[im 25/25]
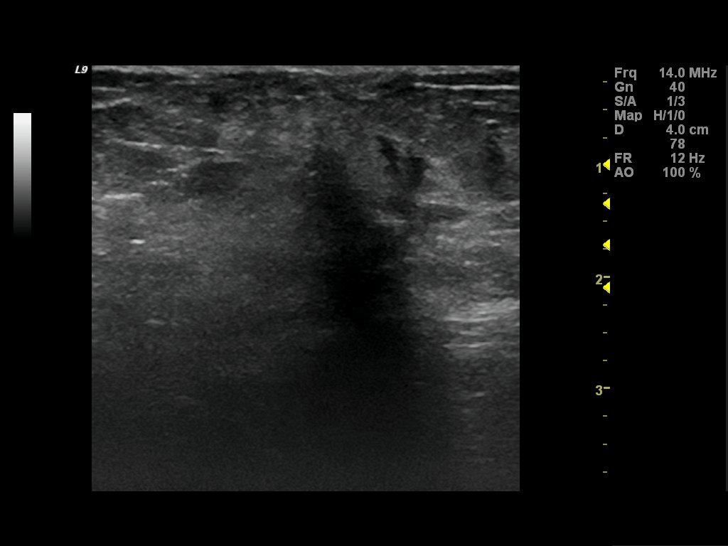

[13 of 25 positions shown; findings below may reference images not displayed]

FINDINGS: On physical exam, a heart fixed palpable masses identified in the
upper inner right breast.

Targeted ultrasound is performed. This demonstrates a hypoechoic
irregular mass with non parallel orientation and indistinct margins
in the upper inner right breast at 1 o'clock 6 cm from the nipple.
This mass corresponds to the palpable finding on physical exam.
Given the indistinct margins and extensive acoustic shadowing exact
dimensions are difficult to determine, however this measures
approximately 3.1 x 1.9 x 1.4 cm in maximal diameter. This mass
demonstrates sonographic features that are highly suggestive of
malignancy.

Ultrasound evaluation of the right axilla demonstrates a few
morphologically normal right axillary lymph nodes with uniform
cortices and preservation of the fatty hilum.
IMPRESSION: A 3.1 cm diameter palpable mass in the upper inner right breast at 1
o'clock 6 cm from the nipple demonstrates imaging features highly
suggestive of malignancy.

RECOMMENDATION:
Recommend ultrasound-guided core needle biopsy. This will be
performed later today.

I have discussed the findings and recommendations with the patient.
Results were also provided in writing at the conclusion of the
visit. If applicable, a reminder letter will be sent to the patient
regarding the next appointment.

BI-RADS CATEGORY  5: Highly suggestive of malignancy - appropriate
action should be taken.

## 2016-10-11 ENCOUNTER — Other Ambulatory Visit: Payer: Self-pay | Admitting: Internal Medicine

## 2016-10-21 DIAGNOSIS — M65331 Trigger finger, right middle finger: Secondary | ICD-10-CM | POA: Diagnosis not present

## 2016-10-21 DIAGNOSIS — M25561 Pain in right knee: Secondary | ICD-10-CM | POA: Diagnosis not present

## 2016-10-21 DIAGNOSIS — M19031 Primary osteoarthritis, right wrist: Secondary | ICD-10-CM | POA: Diagnosis not present

## 2016-10-21 DIAGNOSIS — M25562 Pain in left knee: Secondary | ICD-10-CM | POA: Diagnosis not present

## 2017-03-15 ENCOUNTER — Other Ambulatory Visit: Payer: Self-pay

## 2017-03-15 DIAGNOSIS — C50211 Malignant neoplasm of upper-inner quadrant of right female breast: Secondary | ICD-10-CM

## 2017-03-15 MED ORDER — ANASTROZOLE 1 MG PO TABS: 1.0000 mg | ORAL_TABLET | Freq: Every day | ORAL | 3 refills | Status: DC

## 2017-05-20 ENCOUNTER — Other Ambulatory Visit: Payer: Self-pay | Admitting: Internal Medicine

## 2017-05-20 DIAGNOSIS — Z9889 Other specified postprocedural states: Secondary | ICD-10-CM

## 2017-05-24 DIAGNOSIS — R2232 Localized swelling, mass and lump, left upper limb: Secondary | ICD-10-CM | POA: Diagnosis not present

## 2017-05-26 ENCOUNTER — Ambulatory Visit
Admission: RE | Admit: 2017-05-26 | Discharge: 2017-05-26 | Disposition: A | Discharge status: Home / Self Care | Payer: Medicare Other | Source: Ambulatory Visit | Attending: Internal Medicine | Admitting: Internal Medicine

## 2017-05-26 DIAGNOSIS — R928 Other abnormal and inconclusive findings on diagnostic imaging of breast: Secondary | ICD-10-CM | POA: Diagnosis not present

## 2017-05-26 DIAGNOSIS — Z9889 Other specified postprocedural states: Secondary | ICD-10-CM

## 2017-05-26 HISTORY — DX: Personal history of irradiation: Z92.3

## 2017-05-26 LAB — HM MAMMOGRAPHY

## 2017-06-14 DIAGNOSIS — R2232 Localized swelling, mass and lump, left upper limb: Secondary | ICD-10-CM | POA: Diagnosis not present

## 2017-06-20 ENCOUNTER — Other Ambulatory Visit: Payer: Self-pay | Admitting: Internal Medicine

## 2017-06-23 ENCOUNTER — Telehealth: Payer: Self-pay | Admitting: Hematology and Oncology

## 2017-06-23 ENCOUNTER — Ambulatory Visit: Payer: Medicare Other | Admitting: Hematology and Oncology

## 2017-06-23 DIAGNOSIS — Z170 Estrogen receptor positive status [ER+]: Secondary | ICD-10-CM | POA: Diagnosis not present

## 2017-06-23 DIAGNOSIS — C50211 Malignant neoplasm of upper-inner quadrant of right female breast: Secondary | ICD-10-CM

## 2017-06-23 DIAGNOSIS — Z79811 Long term (current) use of aromatase inhibitors: Secondary | ICD-10-CM | POA: Diagnosis not present

## 2017-06-23 MED ORDER — ANASTROZOLE 1 MG PO TABS: 1.0000 mg | ORAL_TABLET | Freq: Every day | ORAL | 3 refills | Status: DC

## 2017-06-23 NOTE — Telephone Encounter (Signed)
Gave patient avs and calendar with appts per 11/28 los.

## 2017-06-23 NOTE — Assessment & Plan Note (Signed)
Rt Lumpectomy 11/14/14: IDC Grade 3, 2.7 cm size, 0/3 LN , Margins clear; T2N0 Stage IIA Right breast invasive ductal carcinoma diagnosed with screening mammogram, 3.1 cm by ultrasound in the upper quadrant 6 cm from the nipple, grade 2-3, EF 100%, PR 48%, HER-2 negative ratio 0.6, Ki-67 was 28%, Oncotype DX score 19, 12% risk of recurrence, status post radiation 4800 cGy in 24 sessions completed 01/29/2015  Treatment plan: antiestrogen therapy with anastrozole 1 mg daily started 02/14/2015  Anastrozole toxicities: Tolerating it very well without any side effects  Breast Cancer Surveillance: 1. Breast exam 06/23/2017: Normal 2. Mammograms 05/26/2017:No concern for cancer, breast density category B  Return to clinic in 1 year

## 2017-06-23 NOTE — Progress Notes (Signed)
Patient Care Team: [name], MD as PCP - General (Internal Medicine) [name], MD as Consulting Physician (General Surgery) [name], MD as Consulting Physician (Hematology and Oncology) [name], MD as Consulting Physician (Radiation Oncology) Mauro Kaufmann, RN as Registered Nurse Rockwell Germany, RN as Registered Nurse Holley Bouche, NP as Nurse Practitioner (Nurse Practitioner) Marchia Bond, MD as Consulting Physician (Orthopedic Surgery) Sylvan Cheese, NP as Nurse Practitioner (Hematology and Oncology)  DIAGNOSIS:  Encounter Diagnosis  Name Primary?  . Malignant neoplasm of upper-inner quadrant of right breast in female, estrogen receptor positive ([place])     SUMMARY OF ONCOLOGIC HISTORY:   Breast cancer of upper-inner quadrant of right female breast ([place])   10/19/2014 Mammogram    Right breast mass 3.1 cm upper quadrant, 6 cm from the nipple      10/19/2014 Initial Biopsy    Right breast core needle bx: invasive ductal carcinoma, grade 2-3, ER+ (100%), PR+ (48%), HER-2 negative (ratio 0.6), Ki-67 28%      10/20/2014 Clinical Stage    Stage IIA: T2 N0      11/13/2014 Definitive Surgery    Right Lumpectomy/SLNB Dalbert Batman): IDC, grade 3, 2.7 cm size, 3 LN removed and negative for malignancy (0/3) margins clear. HER2/neu repeated and remains negative (ratio 0.88).      11/13/2014 Pathologic Stage    Stage IIA: pT2 pN0      11/13/2014 Oncotype testing    Score 19 (12% ROR). No chemotherapy Lindi Adie).      12/26/2014 - 01/29/2015 Radiation Therapy    Adjuvant RT Valere Dross): Right breast 48 Gy over 24 sessions      02/14/2015 -  Anti-estrogen oral therapy    Anastrozole 1 mg daily. Planned duration of therapy 5 years [place])       Survivorship    Survivorship care plan completed and mailed to patient in lieu of inperson visit.       CHIEF COMPLIANT: Follow-up on anastrozole therapy  INTERVAL HISTORY: Stacey Navarro is a  76 year old with above-mentioned history of right breast cancer treated with lumpectomy and radiation and is currently on hormonal therapy with anastrozole.  She is tolerating extremely well.  She had a recent mammogram which was normal.  She denies any lumps or nodules in the breast.  Her mammogram was delayed by 6 months because of her brothers death and she was helping to take care of the apartment and all the furniture etc.  REVIEW OF SYSTEMS:   Constitutional: Denies fevers, chills or abnormal weight loss Eyes: Denies blurriness of vision Ears, nose, mouth, throat, and face: Denies mucositis or sore throat Respiratory: Denies cough, dyspnea or wheezes Cardiovascular: Denies palpitation, chest discomfort Gastrointestinal:  Denies nausea, heartburn or change in bowel habits Skin: Denies abnormal skin rashes Lymphatics: Denies new lymphadenopathy or easy bruising Neurological:Denies numbness, tingling or new weaknesses Behavioral/Psych: Mood is stable, no new changes  Extremities: No lower extremity edema Breast:  denies any pain or lumps or nodules in either breasts All other systems were reviewed with the patient and are negative.  I have reviewed the past medical history, past surgical history, social history and family history with the patient and they are unchanged from previous note.  ALLERGIES:  has No Known Allergies.  MEDICATIONS:  Current Outpatient Medications  Medication Sig Dispense Refill  . anastrozole (ARIMIDEX) 1 MG tablet Take 1 tablet (1 mg total) by mouth daily. 90 tablet 3  . baclofen (LIORESAL) 10 MG tablet  Take 1 tablet (10 mg total) by mouth 3 (three) times daily. As needed for muscle spasm 50 tablet 0  . calcium carbonate (OS-CAL) 1250 (500 CA) MG chewable tablet Chew 1 tablet by mouth daily.    . cholecalciferol (VITAMIN D) 1000 UNITS tablet Take 1,000 Units by mouth daily.    . meloxicam (MOBIC) 7.5 MG tablet Take by mouth.    . Omega-3 Fatty Acids (FISH OIL)  1000 MG CPDR Take by mouth.    . pantoprazole (PROTONIX) 40 MG tablet TAKE ONE TABLET BY MOUTH ONCE DAILY 90 tablet 1  . vitamin E 400 UNIT capsule Take 400 Units by mouth daily.     No current facility-administered medications for this visit.     PHYSICAL EXAMINATION: ECOG PERFORMANCE STATUS: 1 - Symptomatic but completely ambulatory  Vitals:   06/23/17 0950  BP: 123/77  Pulse: 77  Resp: 18  Temp: 98.3 F (36.8 C)  SpO2: 96%   Filed Weights   06/23/17 0950  Weight: 214 lb 6.4 oz (97.3 kg)    GENERAL:alert, no distress and comfortable SKIN: skin color, texture, turgor are normal, no rashes or significant lesions EYES: normal, Conjunctiva are pink and non-injected, sclera clear OROPHARYNX:no exudate, no erythema and lips, buccal mucosa, and tongue normal  NECK: supple, thyroid normal size, non-tender, without nodularity LYMPH:  no palpable lymphadenopathy in the cervical, axillary or inguinal LUNGS: clear to auscultation and percussion with normal breathing effort HEART: regular rate & rhythm and no murmurs and no lower extremity edema ABDOMEN:abdomen soft, non-tender and normal bowel sounds MUSCULOSKELETAL:no cyanosis of digits and no clubbing  NEURO: alert & oriented x 3 with fluent speech, no focal motor/sensory deficits EXTREMITIES: No lower extremity edema BREAST: No palpable masses or nodules in either right or left breasts. No palpable axillary supraclavicular or infraclavicular adenopathy no breast tenderness or nipple discharge. (exam performed in the presence of a chaperone)  LABORATORY DATA:  I have reviewed the data as listed   Chemistry      Component Value Date/Time   NA 139 03/16/2016 0855   NA 142 10/31/2014 0818   K 4.1 03/16/2016 0855   K 4.6 10/31/2014 0818   CL 101 03/16/2016 0855   CO2 27 03/16/2016 0855   CO2 24 10/31/2014 0818   BUN 21 03/16/2016 0855   BUN 18.9 10/31/2014 0818   CREATININE 1.01 03/16/2016 0855   CREATININE 1.0 10/31/2014  0818      Component Value Date/Time   CALCIUM 10.5 03/16/2016 0855   CALCIUM 9.4 10/31/2014 0818   ALKPHOS 126 (H) 03/16/2016 0855   ALKPHOS 112 10/31/2014 0818   AST 34 03/16/2016 0855   AST 33 10/31/2014 0818   ALT 48 (H) 03/16/2016 0855   ALT 48 10/31/2014 0818   BILITOT 0.6 03/16/2016 0855   BILITOT 0.41 10/31/2014 0818       Lab Results  Component Value Date   WBC 6.8 03/16/2016   HGB 15.7 (H) 03/16/2016   HCT 45.8 03/16/2016   MCV 94.7 03/16/2016   PLT 298.0 03/16/2016   NEUTROABS 4.3 03/16/2016    ASSESSMENT & PLAN:  Breast cancer of upper-inner quadrant of right female breast Rt Lumpectomy 11/14/14: IDC Grade 3, 2.7 cm size, 0/3 LN , Margins clear; T2N0 Stage IIA Right breast invasive ductal carcinoma diagnosed with screening mammogram, 3.1 cm by ultrasound in the upper quadrant 6 cm from the nipple, grade 2-3, EF 100%, PR 48%, HER-2 negative ratio 0.6, Ki-67 was 28%, Oncotype DX  score 19, 12% risk of recurrence, status post radiation 4800 cGy in 24 sessions completed 01/29/2015  Treatment plan: antiestrogen therapy with anastrozole 1 mg daily started 02/14/2015  Anastrozole toxicities: Tolerating it very well without any side effects  Breast Cancer Surveillance: 1. Breast exam 06/23/2017: Normal 2. Mammograms 05/26/2017:No concern for cancer, breast density category B  Return to clinic in 1 year   I spent 25 minutes talking to the patient of which more than half was spent in counseling and coordination of care.  No orders of the defined types were placed in this encounter.  The patient has a good understanding of the overall plan. she agrees with it. she will call with any problems that may develop before the next visit here.   Rulon Eisenmenger, MD 06/23/17

## 2017-07-12 ENCOUNTER — Encounter: Payer: Medicare Other | Admitting: Internal Medicine

## 2017-07-21 ENCOUNTER — Encounter: Payer: Self-pay | Admitting: Internal Medicine

## 2017-07-21 ENCOUNTER — Other Ambulatory Visit (INDEPENDENT_AMBULATORY_CARE_PROVIDER_SITE_OTHER): Payer: Medicare Other

## 2017-07-21 ENCOUNTER — Ambulatory Visit (INDEPENDENT_AMBULATORY_CARE_PROVIDER_SITE_OTHER): Payer: Medicare Other | Admitting: Internal Medicine

## 2017-07-21 VITALS — BP 120/80 | HR 97 | Temp 98.3°F | Resp 16 | Ht 66.0 in | Wt 216.0 lb

## 2017-07-21 DIAGNOSIS — E785 Hyperlipidemia, unspecified: Secondary | ICD-10-CM

## 2017-07-21 DIAGNOSIS — K2100 Gastro-esophageal reflux disease with esophagitis, without bleeding: Secondary | ICD-10-CM

## 2017-07-21 DIAGNOSIS — I8312 Varicose veins of left lower extremity with inflammation: Secondary | ICD-10-CM | POA: Diagnosis not present

## 2017-07-21 DIAGNOSIS — I83893 Varicose veins of bilateral lower extremities with other complications: Secondary | ICD-10-CM | POA: Insufficient documentation

## 2017-07-21 DIAGNOSIS — I8311 Varicose veins of right lower extremity with inflammation: Secondary | ICD-10-CM

## 2017-07-21 DIAGNOSIS — R739 Hyperglycemia, unspecified: Secondary | ICD-10-CM

## 2017-07-21 DIAGNOSIS — E669 Obesity, unspecified: Secondary | ICD-10-CM | POA: Diagnosis not present

## 2017-07-21 LAB — LIPID PANEL
Cholesterol: 250 mg/dL — ABNORMAL HIGH (ref 0–200)
HDL: 61.9 mg/dL (ref >39.00)
LDL Cholesterol: 169 mg/dL — ABNORMAL HIGH (ref 0–99)
NonHDL: 188.5
Total CHOL/HDL Ratio: 4
Triglycerides: 98 mg/dL (ref 0.0–149.0)
VLDL: 19.6 mg/dL (ref 0.0–40.0)

## 2017-07-21 LAB — COMPREHENSIVE METABOLIC PANEL
ALBUMIN: 4.2 g/dL (ref 3.5–5.2)
ALT: 34 U/L (ref 0–35)
AST: 30 U/L (ref 0–37)
Alkaline Phosphatase: 103 U/L (ref 39–117)
BUN: 20 mg/dL (ref 6–23)
CALCIUM: 9.7 mg/dL (ref 8.4–10.5)
CHLORIDE: 103 meq/L (ref 96–112)
CO2: 29 mEq/L (ref 19–32)
Creatinine, Ser: 0.95 mg/dL (ref 0.40–1.20)
GFR: 60.99 mL/min (ref >60.00)
Glucose, Bld: 105 mg/dL — ABNORMAL HIGH (ref 70–99)
POTASSIUM: 4 meq/L (ref 3.5–5.1)
Sodium: 140 mEq/L (ref 135–145)
Total Bilirubin: 0.5 mg/dL (ref 0.2–1.2)
Total Protein: 7.4 g/dL (ref 6.0–8.3)

## 2017-07-21 LAB — CBC WITH DIFFERENTIAL/PLATELET
BASOS PCT: 1 % (ref 0.0–3.0)
Basophils Absolute: 0.1 10*3/uL (ref 0.0–0.1)
EOS PCT: 2.4 % (ref 0.0–5.0)
Eosinophils Absolute: 0.2 10*3/uL (ref 0.0–0.7)
HEMATOCRIT: 43.9 % (ref 36.0–46.0)
HEMOGLOBIN: 14.3 g/dL (ref 12.0–15.0)
LYMPHS PCT: 28.1 % (ref 12.0–46.0)
Lymphs Abs: 1.9 10*3/uL (ref 0.7–4.0)
MCHC: 32.7 g/dL (ref 30.0–36.0)
MCV: 99.7 fl (ref 78.0–100.0)
MONO ABS: 0.7 10*3/uL (ref 0.1–1.0)
Monocytes Relative: 10.6 % (ref 3.0–12.0)
Neutro Abs: 3.9 10*3/uL (ref 1.4–7.7)
Neutrophils Relative %: 57.9 % (ref 43.0–77.0)
Platelets: 275 10*3/uL (ref 150.0–400.0)
RBC: 4.4 Mil/uL (ref 3.87–5.11)
RDW: 13.8 % (ref 11.5–15.5)
WBC: 6.7 10*3/uL (ref 4.0–10.5)

## 2017-07-21 LAB — TSH: TSH: 3.2 u[IU]/mL (ref 0.35–4.50)

## 2017-07-21 MED ORDER — PANTOPRAZOLE SODIUM 40 MG PO TBEC: 40.0000 mg | DELAYED_RELEASE_TABLET | Freq: Every day | ORAL | 1 refills | Status: DC

## 2017-07-21 NOTE — Progress Notes (Signed)
Subjective:  Patient ID: Stacey Navarro, female    DOB: April 08, 1941  Age: 76 y.o. MRN: [id number]  CC: Gastroesophageal Reflux and Hyperlipidemia   HPI Stacey Navarro presents for f/up -she feels well today and offers no complaints.  She tells me her heartburn is well controlled with pantoprazole.  She has had no recent episodes of odynophagia or dysphagia.  She complains about varicose ankles around both ankles.  They are not symptomatic but they bother her cosmetically.  Outpatient Medications Prior to Visit  Medication Sig Dispense Refill  . anastrozole (ARIMIDEX) 1 MG tablet Take 1 tablet (1 mg total) by mouth daily. 90 tablet 3  . calcium carbonate (OS-CAL) 1250 (500 CA) MG chewable tablet Chew 1 tablet by mouth daily.    . cholecalciferol (VITAMIN D) 1000 UNITS tablet Take 1,000 Units by mouth daily.    . cycloSPORINE (RESTASIS) 0.05 % ophthalmic emulsion Place 1 drop into both eyes 2 (two) times daily.    . vitamin E 400 UNIT capsule Take 400 Units by mouth daily.    . baclofen (LIORESAL) 10 MG tablet Take 1 tablet (10 mg total) by mouth 3 (three) times daily. As needed for muscle spasm 50 tablet 0  . meloxicam (MOBIC) 7.5 MG tablet Take by mouth.    . Omega-3 Fatty Acids (FISH OIL) 1000 MG CPDR Take by mouth.    . pantoprazole (PROTONIX) 40 MG tablet TAKE ONE TABLET BY MOUTH ONCE DAILY 90 tablet 1   No facility-administered medications prior to visit.     ROS Review of Systems  Constitutional: Negative.  Negative for diaphoresis, fatigue and unexpected weight change.  HENT: Negative.  Negative for sinus pressure, trouble swallowing and voice change.   Eyes: Negative.  Negative for visual disturbance.  Respiratory: Negative for cough, chest tightness, shortness of breath and wheezing.   Cardiovascular: Negative.  Negative for chest pain, palpitations and leg swelling.  Gastrointestinal: Negative for abdominal pain, constipation, diarrhea, nausea and vomiting.  Endocrine:  Negative.   Genitourinary: Negative.  Negative for difficulty urinating and frequency.  Musculoskeletal: Negative.  Negative for back pain and myalgias.  Skin: Negative.  Negative for pallor.  Allergic/Immunologic: Negative.   Neurological: Negative.  Negative for dizziness, weakness and headaches.  Hematological: Negative for adenopathy. Does not bruise/bleed easily.  Psychiatric/Behavioral: Negative.     Objective:  BP 120/80 (BP Location: Left Arm, Patient Position: Sitting, Cuff Size: Normal)   Pulse 97   Temp 98.3 F (36.8 C) (Oral)   Resp 16   Ht 5' 6"  (1.676 m)   Wt 216 lb (98 kg)   SpO2 97%   BMI 34.86 kg/m   BP Readings from Last 3 Encounters:  07/21/17 120/80  06/23/17 123/77  06/23/16 130/66    Wt Readings from Last 3 Encounters:  07/21/17 216 lb (98 kg)  06/23/17 214 lb 6.4 oz (97.3 kg)  06/23/16 226 lb 14.4 oz (102.9 kg)    Physical Exam  Constitutional: She is oriented to person, place, and time. No distress.  HENT:  Mouth/Throat: Oropharynx is clear and moist. No oropharyngeal exudate.  Eyes: Conjunctivae are normal. Left eye exhibits no discharge. No scleral icterus.  Neck: Normal range of motion. Neck supple. No JVD present. No thyromegaly present.  Cardiovascular: Normal rate, regular rhythm and normal heart sounds.  No murmur heard. Pulmonary/Chest: Effort normal and breath sounds normal. She has no wheezes. She has no rales.  Abdominal: Soft. Bowel sounds are normal. She exhibits no mass.  There is no tenderness. There is no guarding.  Musculoskeletal: Normal range of motion. She exhibits no edema, tenderness or deformity.  There are small, uncomplicated varicosities around both ankles.  Lymphadenopathy:    She has no cervical adenopathy.  Neurological: She is alert and oriented to person, place, and time.  Skin: Skin is warm and dry. No rash noted. She is not diaphoretic. No erythema. No pallor.  Vitals reviewed.   Lab Results  Component Value  Date   WBC 6.7 07/21/2017   HGB 14.3 07/21/2017   HCT 43.9 07/21/2017   PLT 275.0 07/21/2017   GLUCOSE 105 (H) 07/21/2017   CHOL 250 (H) 07/21/2017   TRIG 98.0 07/21/2017   HDL 61.90 07/21/2017   LDLDIRECT 158.4 07/17/2011   LDLCALC 169 (H) 07/21/2017   ALT 34 07/21/2017   AST 30 07/21/2017   NA 140 07/21/2017   K 4.0 07/21/2017   CL 103 07/21/2017   CREATININE 0.95 07/21/2017   BUN 20 07/21/2017   CO2 29 07/21/2017   TSH 3.20 07/21/2017   HGBA1C 5.9 03/16/2016    Mm Diag Breast Tomo Bilateral  Addendum Date: 05/26/2017   ADDENDUM REPORT: 05/26/2017 09:56 ADDENDUM: The CLINICAL DATA section should read: History of right breast cancer, lumpectomy and radiation in 2016. Electronically Signed   By: Margarette Canada M.D.   On: 05/26/2017 09:56   Result Date: 05/26/2017 CLINICAL DATA:  76 year old female for annual bilateral mammograms. History of right breast cancer and lumpectomy in 2017. Radiation in 2018. EXAM: 2D DIGITAL DIAGNOSTIC BILATERAL MAMMOGRAM WITH CAD AND ADJUNCT TOMO COMPARISON:  Previous exam(s). ACR Breast Density Category b: There are scattered areas of fibroglandular density. FINDINGS: 2D and 3D full field views of both breasts and a magnification view of the lumpectomy site demonstrate no suspicious mass, nonsurgical distortion or worrisome calcifications. Lumpectomy and radiation type changes within the right breast noted. Mammographic images were processed with CAD. IMPRESSION: No mammographic evidence of breast malignancy. RECOMMENDATION: Bilateral diagnostic mammograms in 1 year. I have discussed the findings and recommendations with the patient. Results were also provided in writing at the conclusion of the visit. If applicable, a reminder letter will be sent to the patient regarding the next appointment. BI-RADS CATEGORY  2: Benign. Electronically Signed: By: Margarette Canada M.D. On: 05/26/2017 09:47    Assessment & Plan:   [name] was seen today for gastroesophageal  reflux and hyperlipidemia.  Diagnoses and all orders for this visit:  Varicose veins of both lower extremities with inflammation -     Ambulatory referral to Vascular Surgery  Gastroesophageal reflux disease with esophagitis- Her symptoms are well controlled.  Will continue the PPI. -     CBC with Differential/Platelet; Future  Obesity (BMI 35.0-39.9 without comorbidity)- She agrees to work on her lifestyle modifications.  Hyperlipidemia with target LDL less than 130- She has not achieved her LDL goal and has a slightly elevated ASCVD risk score.  However, at this time she is not willing to take a statin for CV risk reduction. -     Lipid panel; Future -     TSH; Future  Hyperglycemia- improvement noted -     Comprehensive metabolic panel; Future  Other orders -     pantoprazole (PROTONIX) 40 MG tablet; Take 1 tablet (40 mg total) by mouth daily.   I have discontinued Joycelyn Schmid A. Vassey's Fish Oil, baclofen, and meloxicam. I have also changed her pantoprazole. Additionally, I am having her maintain her cholecalciferol, vitamin E, calcium  carbonate, anastrozole, and cycloSPORINE.  Meds ordered this encounter  Medications  . pantoprazole (PROTONIX) 40 MG tablet    Sig: Take 1 tablet (40 mg total) by mouth daily.    Dispense:  90 tablet    Refill:  1     Follow-up: Return in about 1 year (around 07/21/2018).  [name], MD

## 2017-07-21 NOTE — Patient Instructions (Signed)
Gastroesophageal Reflux Disease, Adult Normally, food travels down the esophagus and stays in the stomach to be digested. However, when a person has gastroesophageal reflux disease (GERD), food and stomach acid move back up into the esophagus. When this happens, the esophagus becomes sore and inflamed. Over time, GERD can create small holes (ulcers) in the lining of the esophagus. What are the causes? This condition is caused by a problem with the muscle between the esophagus and the stomach (lower esophageal sphincter, or LES). Normally, the LES muscle closes after food passes through the esophagus to the stomach. When the LES is weakened or abnormal, it does not close properly, and that allows food and stomach acid to go back up into the esophagus. The LES can be weakened by certain dietary substances, medicines, and medical conditions, including:  Tobacco use.  Pregnancy.  Having a hiatal hernia.  Heavy alcohol use.  Certain foods and beverages, such as coffee, chocolate, onions, and peppermint.  What increases the risk? This condition is more likely to develop in:  People who have an increased body weight.  People who have connective tissue disorders.  People who use NSAID medicines.  What are the signs or symptoms? Symptoms of this condition include:  Heartburn.  Difficult or painful swallowing.  The feeling of having a lump in the throat.  Abitter taste in the mouth.  Bad breath.  Having a large amount of saliva.  Having an upset or bloated stomach.  Belching.  Chest pain.  Shortness of breath or wheezing.  Ongoing (chronic) cough or a night-time cough.  Wearing away of tooth enamel.  Weight loss.  Different conditions can cause chest pain. Make sure to see your health care provider if you experience chest pain. How is this diagnosed? Your health care provider will take a medical history and perform a physical exam. To determine if you have mild or severe  GERD, your health care provider may also monitor how you respond to treatment. You may also have other tests, including:  An endoscopy toexamine your stomach and esophagus with a small camera.  A test thatmeasures the acidity level in your esophagus.  A test thatmeasures how much pressure is on your esophagus.  A barium swallow or modified barium swallow to show the shape, size, and functioning of your esophagus.  How is this treated? The goal of treatment is to help relieve your symptoms and to prevent complications. Treatment for this condition may vary depending on how severe your symptoms are. Your health care provider may recommend:  Changes to your diet.  Medicine.  Surgery.  Follow these instructions at home: Diet  Follow a diet as recommended by your health care provider. This may involve avoiding foods and drinks such as: ? Coffee and tea (with or without caffeine). ? Drinks that containalcohol. ? Energy drinks and sports drinks. ? Carbonated drinks or sodas. ? Chocolate and cocoa. ? Peppermint and mint flavorings. ? Garlic and onions. ? Horseradish. ? Spicy and acidic foods, including peppers, chili powder, curry powder, vinegar, hot sauces, and barbecue sauce. ? Citrus fruit juices and citrus fruits, such as oranges, lemons, and limes. ? Tomato-based foods, such as red sauce, chili, salsa, and pizza with red sauce. ? Fried and fatty foods, such as donuts, french fries, potato chips, and high-fat dressings. ? High-fat meats, such as hot dogs and fatty cuts of red and white meats, such as rib eye steak, sausage, ham, and bacon. ? High-fat dairy items, such as whole milk,  butter, and cream cheese.  Eat small, frequent meals instead of large meals.  Avoid drinking large amounts of liquid with your meals.  Avoid eating meals during the 2-3 hours before bedtime.  Avoid lying down right after you eat.  Do not exercise right after you eat. General  instructions  Pay attention to any changes in your symptoms.  Take over-the-counter and prescription medicines only as told by your health care provider. Do not take aspirin, ibuprofen, or other NSAIDs unless your health care provider told you to do so.  Do not use any tobacco products, including cigarettes, chewing tobacco, and e-cigarettes. If you need help quitting, ask your health care provider.  Wear loose-fitting clothing. Do not wear anything tight around your waist that causes pressure on your abdomen.  Raise (elevate) the head of your bed 6 inches (15cm).  Try to reduce your stress, such as with yoga or meditation. If you need help reducing stress, ask your health care provider.  If you are overweight, reduce your weight to an amount that is healthy for you. Ask your health care provider for guidance about a safe weight loss goal.  Keep all follow-up visits as told by your health care provider. This is important. Contact a health care provider if:  You have new symptoms.  You have unexplained weight loss.  You have difficulty swallowing, or it hurts to swallow.  You have wheezing or a persistent cough.  Your symptoms do not improve with treatment.  You have a hoarse voice. Get help right away if:  You have pain in your arms, neck, jaw, teeth, or back.  You feel sweaty, dizzy, or light-headed.  You have chest pain or shortness of breath.  You vomit and your vomit looks like blood or coffee grounds.  You faint.  Your stool is bloody or black.  You cannot swallow, drink, or eat. This information is not intended to replace advice given to you by your health care provider. Make sure you discuss any questions you have with your health care provider. Document Released: 04/22/2005 Document Revised: 12/11/2015 Document Reviewed: 11/07/2014 Elsevier Interactive Patient Education  [employer].

## 2017-07-30 ENCOUNTER — Other Ambulatory Visit: Payer: Self-pay

## 2017-07-30 DIAGNOSIS — I83813 Varicose veins of bilateral lower extremities with pain: Secondary | ICD-10-CM

## 2017-08-30 ENCOUNTER — Encounter: Payer: Self-pay | Admitting: Vascular Surgery

## 2017-08-30 ENCOUNTER — Ambulatory Visit: Payer: Medicare Other | Admitting: Vascular Surgery

## 2017-08-30 VITALS — BP 139/78 | HR 88 | Temp 97.0°F | Resp 18 | Ht 66.0 in | Wt 217.5 lb

## 2017-08-30 DIAGNOSIS — I83893 Varicose veins of bilateral lower extremities with other complications: Secondary | ICD-10-CM

## 2017-08-30 NOTE — Progress Notes (Signed)
Subjective:     Patient ID: Stacey Navarro, female   DOB: 06-Apr-1941, 77 y.o.   MRN: [id number]  HPI This 77 year old female was referred by Dr. Ronnald Ramp for evaluation of bilateral varicose veins. Patient has noted increasingly prominent veins around the left medial ankle area and some bulging areas in the medial calf and thigh. She will developed some swelling in the left ankle as the day progresses. She has a similar situation not as severe on the right side. She will developed some heaviness as the day progresses. She denies any history of DVT thrombophlebitis stasis ulcers or bleeding.  Past Medical History:  Diagnosis Date  . Arthritis 2010   OA in knees  . Breast cancer of upper-inner quadrant of right female breast ([place]) 10/23/2014  . Carpal tunnel syndrome 02/12/2015   Right  . GERD (gastroesophageal reflux disease)   . Hyperlipidemia   . Personal history of radiation therapy   . Primary localized osteoarthritis of left knee 07/12/2015  . Primary localized osteoarthritis of right knee 05/03/2015  . S/P radiation therapy 12/26/2014 through 01/29/2015    Right breast 4800 cGy in 24 sessions     Social History   Tobacco Use  . Smoking status: Former Smoker    Packs/day: 1.00    Types: Cigarettes    Last attempt to quit: 11/06/1995    Years since quitting: 21.8  . Smokeless tobacco: Never Used  Substance Use Topics  . Alcohol use: Yes    Alcohol/week: 1.2 oz    Types: 2 Glasses of wine per week    Comment: occ    Family History  Problem Relation Age of Onset  . Hypertension Mother   . Alcohol abuse Father   . Breast cancer Maternal Aunt     No Known Allergies   Current Outpatient Medications:  .  anastrozole (ARIMIDEX) 1 MG tablet, Take 1 tablet (1 mg total) by mouth daily., Disp: 90 tablet, Rfl: 3 .  calcium carbonate (OS-CAL) 1250 (500 CA) MG chewable tablet, Chew 1 tablet by mouth daily., Disp:  , Rfl:  .  cholecalciferol (VITAMIN D) 1000 UNITS tablet, Take 1,000 Units by mouth daily., Disp: , Rfl:  .  cycloSPORINE (RESTASIS) 0.05 % ophthalmic emulsion, Place 1 drop into both eyes 2 (two) times daily., Disp: , Rfl:  .  pantoprazole (PROTONIX) 40 MG tablet, Take 1 tablet (40 mg total) by mouth daily., Disp: 90 tablet, Rfl: 1 .  vitamin E 400 UNIT capsule, Take 400 Units by mouth daily., Disp: , Rfl:   Vitals:   08/30/17 0904  BP: 139/78  Pulse: 88  Resp: 18  Temp: (!) 97 F (36.1 C)  TempSrc: Oral  SpO2: 95%  Weight: 217 lb 8 oz (98.7 kg)  Height: 5' 6"  (1.676 m)    Body mass index is 35.11 kg/m.          Review of Systems Denies chest pain, dyspnea on exertion, PND, orthopnea, hemoptysis. Able to ambulate 1/2 mile.    Objective:   Physical Exam BP 139/78 (BP Location: Left Arm, Patient Position: Sitting, Cuff Size: Large)   Pulse 88   Temp (!) 97 F (36.1 C) (Oral)   Resp 18   Ht 5' 6"  (1.676 m)   Wt 217 lb 8 oz (98.7 kg)   SpO2 95%   BMI 35.11 kg/m     Gen.-alert and oriented x3 in no apparent distress HEENT normal for age Lungs no rhonchi or wheezing Cardiovascular regular rhythm no  murmurs carotid pulses 3+ palpable no bruits audible Abdomen soft nontender no palpable masses-prominent suprapubic varicosities noted Musculoskeletal free of  major deformities Skin clear -no rashes Neurologic normal Lower extremities 3+ femoral and dorsalis pedis pulses palpable bilaterally with no edema left leg with prominent pattern of reticular and spider veins along the left medial malleolar area up to the proximal third of the lower leg. A few bulging varicosities in the medial calf and thigh on the left. No hyperpigmentation or ulceration noted. Right leg free of significant varicosities.  Today I performed a bedside SonoSite ultrasound exam which revealed gross reflux in bilateral great saphenous veins left greater than right with significant enlargement of  bilateral great saphenous veins left greater than right       Assessment:     Prominent suprapubic varicosities and extensive pattern of reticular veins left medial malleolar area with gross reflux left great saphenous vein and to lesser degree right great saphenous vein causing symptoms which are affecting patient's daily living    Plan:         #1 long leg elastic compression stockings 20-30 mm gradient #2 elevate legs as much as possible #3 ibuprofen daily on a regular basis for pain #4 return in 3 months-she will have formal bilateral venous reflux exam upon return in all then make formal recommendation May need to consider MR venogram or CT venogram to evaluate suprapubic varicosities Return in 3 months

## 2017-09-08 ENCOUNTER — Encounter (HOSPITAL_COMMUNITY): Payer: Medicare Other

## 2017-09-08 ENCOUNTER — Encounter: Payer: Medicare Other | Admitting: Vascular Surgery

## 2017-09-22 ENCOUNTER — Encounter (HOSPITAL_COMMUNITY): Payer: Medicare Other

## 2017-09-22 ENCOUNTER — Encounter: Payer: Medicare Other | Admitting: Vascular Surgery

## 2017-09-27 ENCOUNTER — Encounter: Payer: Medicare Other | Admitting: Vascular Surgery

## 2017-11-29 ENCOUNTER — Ambulatory Visit: Payer: Medicare Other | Admitting: Vascular Surgery

## 2017-11-29 ENCOUNTER — Encounter (HOSPITAL_COMMUNITY): Payer: Medicare Other

## 2018-01-05 DIAGNOSIS — M25571 Pain in right ankle and joints of right foot: Secondary | ICD-10-CM | POA: Diagnosis not present

## 2018-01-19 ENCOUNTER — Encounter: Payer: Self-pay | Admitting: Family

## 2018-01-19 ENCOUNTER — Ambulatory Visit (INDEPENDENT_AMBULATORY_CARE_PROVIDER_SITE_OTHER): Payer: Medicare Other | Admitting: Family

## 2018-01-19 VITALS — BP 134/86 | HR 65 | Temp 98.2°F | Ht 66.0 in | Wt 217.1 lb

## 2018-01-19 DIAGNOSIS — R600 Localized edema: Secondary | ICD-10-CM

## 2018-01-19 MED ORDER — POTASSIUM CHLORIDE CRYS ER 20 MEQ PO TBCR: 20.0000 meq | EXTENDED_RELEASE_TABLET | Freq: Every day | ORAL | 0 refills | Status: DC

## 2018-01-19 MED ORDER — FUROSEMIDE 20 MG PO TABS: 20.0000 mg | ORAL_TABLET | Freq: Every day | ORAL | 0 refills | Status: DC | PRN

## 2018-01-19 NOTE — Progress Notes (Signed)
Stacey Navarro is a 77 y.o. female with the following history as recorded in EpicCare:  Patient Active Problem List   Diagnosis Date Noted  . Varicose veins of bilateral lower extremities with other complications 51/88/4166  . Hyperglycemia 03/16/2016  . Obesity (BMI 35.0-39.9 without comorbidity) 03/16/2016  . Carpal tunnel syndrome 02/12/2015  . Breast cancer of upper-inner quadrant of right female breast ([place]) 10/23/2014  . Osteoarthritis of right knee 12/14/2012  . GERD (gastroesophageal reflux disease) 07/17/2011  . Hyperlipidemia with target LDL less than 130 07/17/2011  . Visit for screening mammogram 07/17/2011  . Routine general medical examination at a health care facility 07/17/2011    Current Outpatient Medications  Medication Sig Dispense Refill  . anastrozole (ARIMIDEX) 1 MG tablet Take 1 tablet (1 mg total) by mouth daily. 90 tablet 3  . calcium carbonate (OS-CAL) 1250 (500 CA) MG chewable tablet Chew 1 tablet by mouth daily.    . cholecalciferol (VITAMIN D) 1000 UNITS tablet Take 1,000 Units by mouth daily.    . cycloSPORINE (RESTASIS) 0.05 % ophthalmic emulsion Place 1 drop into both eyes 2 (two) times daily.    . meloxicam (MOBIC) 15 MG tablet TAKE 1 TABLET BY MOUTH ONCE DAILY WITH FOOD FOR 2 WEEKS THEN AS NEEDED  1  . pantoprazole (PROTONIX) 40 MG tablet Take 1 tablet (40 mg total) by mouth daily. 90 tablet 1  . vitamin E 400 UNIT capsule Take 400 Units by mouth daily.    . furosemide (LASIX) 20 MG tablet Take 1 tablet (20 mg total) by mouth daily as needed for fluid or edema. 30 tablet 0  . potassium chloride SA (K-DUR,KLOR-CON) 20 MEQ tablet Take 1 tablet (20 mEq total) by mouth daily. 30 tablet 0   No current facility-administered medications for this visit.     Allergies: Patient has no known allergies.  Past Medical History:  Diagnosis Date  . Arthritis 2010   OA in knees  . Breast cancer of upper-inner quadrant of right female breast ([place]) 10/23/2014  .  Carpal tunnel syndrome 02/12/2015   Right  . GERD (gastroesophageal reflux disease)   . Hyperlipidemia   . Personal history of radiation therapy   . Primary localized osteoarthritis of left knee 07/12/2015  . Primary localized osteoarthritis of right knee 05/03/2015  . S/P radiation therapy 12/26/2014 through 01/29/2015    Right breast 4800 cGy in 24 sessions     Past Surgical History:  Procedure Laterality Date  . BREAST LUMPECTOMY Right 11/13/2014  . CHOLECYSTECTOMY  2011  . DILATION AND CURETTAGE OF UTERUS    . PARTIAL KNEE ARTHROPLASTY Right 05/03/2015   Procedure: RIGHT UNICOMPARTMENTAL KNEE;  Surgeon: Marchia Bond, MD;  Location: [facility];  Service: Orthopedics;  Laterality: Right;  . PARTIAL KNEE ARTHROPLASTY Left 07/12/2015   Procedure: LEFT UNICOMPARTMENTAL KNEE;  Surgeon: Marchia Bond, MD;  Location: [facility];  Service: Orthopedics;  Laterality: Left;  . RADIOACTIVE SEED GUIDED PARTIAL MASTECTOMY WITH AXILLARY SENTINEL LYMPH NODE BIOPSY Right 11/13/2014   Procedure: RIGHT PARTIAL MASTECTOMY WITH RADIOACTIVE SEED LOCALIZATION RIGHT  AXILLARY SENTINEL LYMPH NODE BIOPSY;  Surgeon: [name], MD;  Location: [facility];  Service: General;  Laterality: Right;  . TONSILLECTOMY  1948  . TUBAL LIGATION      Family History  Problem Relation Age of Onset  . Hypertension Mother   . Alcohol abuse Father   . Breast cancer Maternal Aunt     Social History  Tobacco Use  . Smoking status: Former Smoker    Packs/day: 1.00    Types: Cigarettes    Last attempt to quit: 11/06/1995    Years since quitting: 22.2  . Smokeless tobacco: Never Used  Substance Use Topics  . Alcohol use: Yes    Alcohol/week: 1.2 oz    Types: 2 Glasses of wine per week    Comment: occ    Subjective:  Patient presents with concerns for swelling in right foot x 1 month; describes  sensation of pain in top of foot; denies any injury or trauma; saw her orthopedist about this 2 weeks ago with these concens- had X-ray done which was normal; was given Mobic but has not tried; denies any chest pain or shortness of breath; denies any pain or swelling in her calf today; does have compression stockings from her vascular surgeon but has not tried wearing them;    Objective:  Vitals:   01/19/18 1409  BP: 134/86  Pulse: 65  Temp: 98.2 F (36.8 C)  TempSrc: Oral  SpO2: 96%  Weight: 217 lb 1.3 oz (98.5 kg)  Height: 5' 6"  (1.676 m)    General: Well developed, well nourished, in no acute distress  Skin : Warm and dry.  Head: Normocephalic and atraumatic  Lungs: Respirations unlabored; clear to auscultation bilaterally without wheeze, rales, rhonchi  CVS exam: normal rate and regular rhythm.  Musculoskeletal: No deformities; no active joint inflammation  Extremities: bilateral pedal edema, no cyanosis, no clubbing; swelling is noted in both of the patient's feet but right greater than left;  Swelling noted in both calves as well; no warmth, redness; Vessels: Symmetric bilaterally  Neurologic: Alert and oriented; speech intact; face symmetrical; moves all extremities well; CNII-XII intact without focal deficit   Assessment:  1. Pedal edema     Plan:  Orthopedist has ruled out fracture of foot; patient has not tried Mobic; will give trial of Lasix 20 mg to use prn for swelling; encouraged to wear her compression stockings as well; if no relief with the Lasix, may need to consider MRI of foot to evaluate for possible stress fracture; follow-up to be determined.   No follow-ups on file.  No orders of the defined types were placed in this encounter.   Requested Prescriptions   Signed Prescriptions Disp Refills  . furosemide (LASIX) 20 MG tablet 30 tablet 0    Sig: Take 1 tablet (20 mg total) by mouth daily as needed for fluid or edema.  . potassium chloride SA (K-DUR,KLOR-CON)  20 MEQ tablet 30 tablet 0    Sig: Take 1 tablet (20 mEq total) by mouth daily.

## 2018-03-31 ENCOUNTER — Other Ambulatory Visit: Payer: Self-pay | Admitting: Internal Medicine

## 2018-05-13 ENCOUNTER — Telehealth: Payer: Self-pay | Admitting: Hematology and Oncology

## 2018-05-13 NOTE — Telephone Encounter (Signed)
VG PAL 11/27 - moved appointments to 11/25. Spoke with patient.

## 2018-05-18 ENCOUNTER — Other Ambulatory Visit: Payer: Medicare Other

## 2018-05-18 ENCOUNTER — Ambulatory Visit: Payer: Medicare Other | Admitting: Family

## 2018-05-18 ENCOUNTER — Other Ambulatory Visit: Payer: Self-pay | Admitting: Family

## 2018-05-18 ENCOUNTER — Encounter: Payer: Self-pay | Admitting: Family

## 2018-05-18 VITALS — BP 132/74 | HR 76 | Temp 98.2°F | Ht 66.0 in | Wt 214.1 lb

## 2018-05-18 DIAGNOSIS — N898 Other specified noninflammatory disorders of vagina: Secondary | ICD-10-CM

## 2018-05-18 LAB — WET PREP, GENITAL
BACTERIA: NONE SEEN — AB
CLUE CELLS WET PREP: NONE SEEN — AB
TRICH WET PREP: NONE SEEN — AB
Yeast Wet Prep HPF POC: NONE SEEN — AB

## 2018-05-18 MED ORDER — METRONIDAZOLE 0.75 % VA GEL: 1.0000 | Freq: Every day | VAGINAL | 0 refills | Status: DC

## 2018-05-18 NOTE — Progress Notes (Signed)
Stacey Navarro is a 77 y.o. female with the following history as recorded in EpicCare:  Patient Active Problem List   Diagnosis Date Noted  . Varicose veins of bilateral lower extremities with other complications 45/40/9811  . Hyperglycemia 03/16/2016  . Obesity (BMI 35.0-39.9 without comorbidity) 03/16/2016  . Carpal tunnel syndrome 02/12/2015  . Breast cancer of upper-inner quadrant of right female breast ([place]) 10/23/2014  . Osteoarthritis of right knee 12/14/2012  . GERD (gastroesophageal reflux disease) 07/17/2011  . Hyperlipidemia with target LDL less than 130 07/17/2011  . Visit for screening mammogram 07/17/2011  . Routine general medical examination at a health care facility 07/17/2011    Current Outpatient Medications  Medication Sig Dispense Refill  . anastrozole (ARIMIDEX) 1 MG tablet Take 1 tablet (1 mg total) by mouth daily. 90 tablet 3  . calcium carbonate (OS-CAL) 1250 (500 CA) MG chewable tablet Chew 1 tablet by mouth daily.    . cholecalciferol (VITAMIN D) 1000 UNITS tablet Take 1,000 Units by mouth daily.    . cycloSPORINE (RESTASIS) 0.05 % ophthalmic emulsion Place 1 drop into both eyes 2 (two) times daily.    . furosemide (LASIX) 20 MG tablet Take 1 tablet (20 mg total) by mouth daily as needed for fluid or edema. 30 tablet 0  . meloxicam (MOBIC) 15 MG tablet TAKE 1 TABLET BY MOUTH ONCE DAILY WITH FOOD FOR 2 WEEKS THEN AS NEEDED  1  . pantoprazole (PROTONIX) 40 MG tablet TAKE 1 TABLET BY MOUTH ONCE DAILY 90 tablet 1  . potassium chloride SA (K-DUR,KLOR-CON) 20 MEQ tablet Take 1 tablet (20 mEq total) by mouth daily. 30 tablet 0  . vitamin E 400 UNIT capsule Take 400 Units by mouth daily.     No current facility-administered medications for this visit.     Allergies: Patient has no known allergies.  Past Medical History:  Diagnosis Date  . Arthritis 2010   OA in knees  . Breast cancer of upper-inner quadrant of right female breast ([place]) 10/23/2014  . Carpal  tunnel syndrome 02/12/2015   Right  . GERD (gastroesophageal reflux disease)   . Hyperlipidemia   . Personal history of radiation therapy   . Primary localized osteoarthritis of left knee 07/12/2015  . Primary localized osteoarthritis of right knee 05/03/2015  . S/P radiation therapy 12/26/2014 through 01/29/2015    Right breast 4800 cGy in 24 sessions     Past Surgical History:  Procedure Laterality Date  . BREAST LUMPECTOMY Right 11/13/2014  . CHOLECYSTECTOMY  2011  . DILATION AND CURETTAGE OF UTERUS    . PARTIAL KNEE ARTHROPLASTY Right 05/03/2015   Procedure: RIGHT UNICOMPARTMENTAL KNEE;  Surgeon: Marchia Bond, MD;  Location: [facility];  Service: Orthopedics;  Laterality: Right;  . PARTIAL KNEE ARTHROPLASTY Left 07/12/2015   Procedure: LEFT UNICOMPARTMENTAL KNEE;  Surgeon: Marchia Bond, MD;  Location: [facility];  Service: Orthopedics;  Laterality: Left;  . RADIOACTIVE SEED GUIDED PARTIAL MASTECTOMY WITH AXILLARY SENTINEL LYMPH NODE BIOPSY Right 11/13/2014   Procedure: RIGHT PARTIAL MASTECTOMY WITH RADIOACTIVE SEED LOCALIZATION RIGHT  AXILLARY SENTINEL LYMPH NODE BIOPSY;  Surgeon: [name], MD;  Location: [facility];  Service: General;  Laterality: Right;  . TONSILLECTOMY  1948  . TUBAL LIGATION      Family History  Problem Relation Age of Onset  . Hypertension Mother   . Alcohol abuse Father   . Breast cancer Maternal Aunt     Social History   Tobacco  Use  . Smoking status: Former Smoker    Packs/day: 1.00    Types: Cigarettes    Last attempt to quit: 11/06/1995    Years since quitting: 22.5  . Smokeless tobacco: Never Used  Substance Use Topics  . Alcohol use: Yes    Alcohol/week: 2.0 standard drinks    Types: 2 Glasses of wine per week    Comment: occ    Subjective:  Vaginal discharge x 3 weeks; + burning, itching; + strong odor; no recent  antibiotics; no vaginal spotting or bleeding; no pelvic pain; denies any concerns for STD exposure; not currently sexually active.     Objective:  Vitals:   05/18/18 1320  BP: 132/74  Pulse: 76  Temp: 98.2 F (36.8 C)  TempSrc: Oral  SpO2: 96%  Weight: 214 lb 1.9 oz (97.1 kg)  Height: 5' 6"  (1.676 m)    General: Well developed, well nourished, in no acute distress  Skin : Warm and dry.  Head: Normocephalic and atraumatic  Lungs: Respirations unlabored; Neurologic: Alert and oriented; speech intact; face symmetrical; moves all extremities well; CNII-XII intact without focal deficit  Pelvic exam- dry tissues noted; + whiff test;  Assessment:  1. Vaginal discharge     Plan:  Suspect BV; update wet prep; will most likely treat Flagyl; follow-up to be determined.   No follow-ups on file.  Orders Placed This Encounter  Procedures  . Wet prep, genital    Standing Status:   Future    Standing Expiration Date:   05/18/2019    Requested Prescriptions    No prescriptions requested or ordered in this encounter

## 2018-05-18 NOTE — Patient Instructions (Signed)

## 2018-06-20 ENCOUNTER — Telehealth: Payer: Self-pay | Admitting: Hematology and Oncology

## 2018-06-20 ENCOUNTER — Inpatient Hospital Stay: Payer: Medicare Other | Admitting: Hematology and Oncology

## 2018-06-20 NOTE — Telephone Encounter (Signed)
R/s appt per 11/22 sch message - pt is aware of appt date and time.

## 2018-06-20 NOTE — Assessment & Plan Note (Deleted)
Rt Lumpectomy 11/14/14: IDC Grade 3, 2.7 cm size, 0/3 LN , Margins clear; T2N0 Stage IIA Right breast invasive ductal carcinoma diagnosed with screening mammogram, 3.1 cm by ultrasound in the upper quadrant 6 cm from the nipple, grade 2-3, EF 100%, PR 48%, HER-2 negative ratio 0.6, Ki-67 was 28%, Oncotype DX score 19, 12% risk of recurrence, status post radiation 4800 cGy in 24 sessions completed 01/29/2015  Treatment plan: antiestrogen therapy with anastrozole 1 mg daily started 02/14/2015  Anastrozole toxicities: Tolerating it very well without any side effects  Breast Cancer Surveillance: 1. Breast exam 06/20/2018: Normal 2. Mammograms 05/26/2017:No concern for cancer, breast density category B Patient will need a new mammogram this year  Return to clinic in 1 year

## 2018-06-24 ENCOUNTER — Ambulatory Visit: Payer: Medicare Other | Admitting: Hematology and Oncology

## 2018-07-22 ENCOUNTER — Other Ambulatory Visit: Payer: Self-pay | Admitting: Hematology and Oncology

## 2018-07-22 DIAGNOSIS — C50211 Malignant neoplasm of upper-inner quadrant of right female breast: Secondary | ICD-10-CM

## 2018-08-01 ENCOUNTER — Telehealth: Payer: Self-pay | Admitting: Hematology and Oncology

## 2018-08-01 ENCOUNTER — Inpatient Hospital Stay: Payer: Medicare Other | Attending: Hematology and Oncology | Admitting: Hematology and Oncology

## 2018-08-01 DIAGNOSIS — C50211 Malignant neoplasm of upper-inner quadrant of right female breast: Secondary | ICD-10-CM | POA: Diagnosis not present

## 2018-08-01 DIAGNOSIS — Z170 Estrogen receptor positive status [ER+]: Secondary | ICD-10-CM

## 2018-08-01 DIAGNOSIS — Z79811 Long term (current) use of aromatase inhibitors: Secondary | ICD-10-CM

## 2018-08-01 MED ORDER — ANASTROZOLE 1 MG PO TABS: 1.0000 mg | ORAL_TABLET | Freq: Every day | ORAL | 3 refills | Status: DC

## 2018-08-01 MED ORDER — VALACYCLOVIR HCL 500 MG PO TABS: 500.0000 mg | ORAL_TABLET | Freq: Two times a day (BID) | ORAL | 0 refills | Status: DC

## 2018-08-01 NOTE — Assessment & Plan Note (Signed)
Rt Lumpectomy 11/14/14: IDC Grade 3, 2.7 cm size, 0/3 LN , Margins clear; T2N0 Stage IIA Right breast invasive ductal carcinoma diagnosed with screening mammogram, 3.1 cm by ultrasound in the upper quadrant 6 cm from the nipple, grade 2-3, EF 100%, PR 48%, HER-2 negative ratio 0.6, Ki-67 was 28%, Oncotype DX score 19, 12% risk of recurrence, status post radiation 4800 cGy in 24 sessions completed 01/29/2015  Treatment plan: antiestrogen therapy with anastrozole 1 mg daily started 02/14/2015  Anastrozole toxicities: Tolerating it very well without any side effects  Breast Cancer Surveillance: 1. Breast exam 08/01/2018: Normal 2. Mammograms 05/26/2017:No concern for cancer, breast density category B Patient will need another mammogram  Return to clinic in 1 year

## 2018-08-01 NOTE — Telephone Encounter (Signed)
Gave avs and calendar ° °

## 2018-08-01 NOTE — Progress Notes (Signed)
Patient Care Team: [name], MD as PCP - General (Internal Medicine) [name], MD as Consulting Physician (General Surgery) [name], MD as Consulting Physician (Hematology and Oncology) [name], MD as Consulting Physician (Radiation Oncology) Mauro Kaufmann, RN as Registered Nurse Rockwell Germany, RN as Registered Nurse Holley Bouche, NP (Inactive) as Nurse Practitioner (Nurse Practitioner) Marchia Bond, MD as Consulting Physician (Orthopedic Surgery) Sylvan Cheese, NP as Nurse Practitioner (Hematology and Oncology)  DIAGNOSIS:  Encounter Diagnosis  Name Primary?  . Malignant neoplasm of upper-inner quadrant of right breast in female, estrogen receptor positive ([place])     SUMMARY OF ONCOLOGIC HISTORY:   Breast cancer of upper-inner quadrant of right female breast ([place])   10/19/2014 Mammogram    Right breast mass 3.1 cm upper quadrant, 6 cm from the nipple    10/19/2014 Initial Biopsy    Right breast core needle bx: invasive ductal carcinoma, grade 2-3, ER+ (100%), PR+ (48%), HER-2 negative (ratio 0.6), Ki-67 28%    10/20/2014 Clinical Stage    Stage IIA: T2 N0    11/13/2014 Definitive Surgery    Right Lumpectomy/SLNB Dalbert Batman): IDC, grade 3, 2.7 cm size, 3 LN removed and negative for malignancy (0/3) margins clear. HER2/neu repeated and remains negative (ratio 0.88).    11/13/2014 Pathologic Stage    Stage IIA: pT2 pN0    11/13/2014 Oncotype testing    Score 19 (12% ROR). No chemotherapy Lindi Adie).    12/26/2014 - 01/29/2015 Radiation Therapy    Adjuvant RT Valere Dross): Right breast 48 Gy over 24 sessions    02/14/2015 -  Anti-estrogen oral therapy    Anastrozole 1 mg daily. Planned duration of therapy 5 years [place])     Survivorship    Survivorship care plan completed and mailed to patient in lieu of inperson visit.     CHIEF COMPLIANT: Follow-up on anastrozole therapy  INTERVAL HISTORY: Stacey Navarro is a 78 year old with  above-mentioned history right breast cancer treated with lumpectomy radiation is currently on anastrozole therapy.  She is tolerating it extremely well.  She does not have any significant hot flashes or arthralgias.  She has very mild hot flashes.  She did not yet get a mammogram this year which she usually gets in the beginning of the year.  She will make an appointment for that.  REVIEW OF SYSTEMS:   Constitutional: Denies fevers, chills or abnormal weight loss Eyes: Denies blurriness of vision Ears, nose, mouth, throat, and face: Denies mucositis or sore throat Respiratory: Denies cough, dyspnea or wheezes Cardiovascular: Denies palpitation, chest discomfort Gastrointestinal:  Denies nausea, heartburn or change in bowel habits Skin: Skin rash in the middle of the chest Lymphatics: Denies new lymphadenopathy or easy bruising Neurological:Denies numbness, tingling or new weaknesses Behavioral/Psych: Mood is stable, no new changes  Extremities: No lower extremity edema Breast:  denies any pain or lumps or nodules in either breasts All other systems were reviewed with the patient and are negative.  I have reviewed the past medical history, past surgical history, social history and family history with the patient and they are unchanged from previous note.  ALLERGIES:  has No Known Allergies.  MEDICATIONS:  Current Outpatient Medications  Medication Sig Dispense Refill  . anastrozole (ARIMIDEX) 1 MG tablet TAKE 1 TABLET BY MOUTH ONCE DAILY 90 tablet 0  . calcium carbonate (OS-CAL) 1250 (500 CA) MG chewable tablet Chew 1 tablet by mouth daily.    . cholecalciferol (VITAMIN D) 1000 UNITS tablet  Take 1,000 Units by mouth daily.    . cycloSPORINE (RESTASIS) 0.05 % ophthalmic emulsion Place 1 drop into both eyes 2 (two) times daily.    . furosemide (LASIX) 20 MG tablet Take 1 tablet (20 mg total) by mouth daily as needed for fluid or edema. 30 tablet 0  . meloxicam (MOBIC) 15 MG tablet TAKE 1  TABLET BY MOUTH ONCE DAILY WITH FOOD FOR 2 WEEKS THEN AS NEEDED  1  . pantoprazole (PROTONIX) 40 MG tablet TAKE 1 TABLET BY MOUTH ONCE DAILY 90 tablet 1  . potassium chloride SA (K-DUR,KLOR-CON) 20 MEQ tablet Take 1 tablet (20 mEq total) by mouth daily. 30 tablet 0  . vitamin E 400 UNIT capsule Take 400 Units by mouth daily.     No current facility-administered medications for this visit.     PHYSICAL EXAMINATION: ECOG PERFORMANCE STATUS: 0 - Asymptomatic  Vitals:   08/01/18 1020  BP: 138/71  Pulse: 80  Resp: 18  Temp: 98.6 F (37 C)  SpO2: 96%   Filed Weights   08/01/18 1020  Weight: 224 lb 1.6 oz (101.7 kg)    GENERAL:alert, no distress and comfortable SKIN: skin color, texture, turgor are normal, no rashes or significant lesions EYES: normal, Conjunctiva are pink and non-injected, sclera clear OROPHARYNX:no exudate, no erythema and lips, buccal mucosa, and tongue normal  NECK: supple, thyroid normal size, non-tender, without nodularity LYMPH:  no palpable lymphadenopathy in the cervical, axillary or inguinal LUNGS: clear to auscultation and percussion with normal breathing effort HEART: regular rate & rhythm and no murmurs and no lower extremity edema ABDOMEN:abdomen soft, non-tender and normal bowel sounds MUSCULOSKELETAL:no cyanosis of digits and no clubbing  NEURO: alert & oriented x 3 with fluent speech, no focal motor/sensory deficits EXTREMITIES: No lower extremity edema BREAST: No palpable masses or nodules in either right or left breasts. No palpable axillary supraclavicular or infraclavicular adenopathy no breast tenderness or nipple discharge.  There is a maculopapular skin eruption in the middle of the chest only towards the left half of her chest makes it suspicious for shingles.  There are grouped vesicles.  (exam performed in the presence of a chaperone)    LABORATORY DATA:  I have reviewed the data as listed CMP Latest Ref Rng & Units 07/21/2017 03/16/2016  05/03/2015  Glucose 70 - 99 mg/dL 105(H) 108(H) 116(H)  BUN 6 - 23 mg/dL 20 21 29(H)  Creatinine 0.40 - 1.20 mg/dL 0.95 1.01 0.80  Sodium 135 - 145 mEq/L 140 139 138  Potassium 3.5 - 5.1 mEq/L 4.0 4.1 4.7  Chloride 96 - 112 mEq/L 103 101 110  CO2 19 - 32 mEq/L 29 27 -  Calcium 8.4 - 10.5 mg/dL 9.7 10.5 -  Total Protein 6.0 - 8.3 g/dL 7.4 7.8 -  Total Bilirubin 0.2 - 1.2 mg/dL 0.5 0.6 -  Alkaline Phos 39 - 117 U/L 103 126(H) -  AST 0 - 37 U/L 30 34 -  ALT 0 - 35 U/L 34 48(H) -    Lab Results  Component Value Date   WBC 6.7 07/21/2017   HGB 14.3 07/21/2017   HCT 43.9 07/21/2017   MCV 99.7 07/21/2017   PLT 275.0 07/21/2017   NEUTROABS 3.9 07/21/2017    ASSESSMENT & PLAN:  Breast cancer of upper-inner quadrant of right female breast Rt Lumpectomy 11/14/14: IDC Grade 3, 2.7 cm size, 0/3 LN , Margins clear; T2N0 Stage IIA Right breast invasive ductal carcinoma diagnosed with screening mammogram, 3.1 cm by  ultrasound in the upper quadrant 6 cm from the nipple, grade 2-3, EF 100%, PR 48%, HER-2 negative ratio 0.6, Ki-67 was 28%, Oncotype DX score 19, 12% risk of recurrence, status post radiation 4800 cGy in 24 sessions completed 01/29/2015  Treatment plan: antiestrogen therapy with anastrozole 1 mg daily started 02/14/2015  Anastrozole toxicities: Tolerating it very well without any side effects  Breast Cancer Surveillance: 1. Breast exam 08/01/2018: Normal 2. Mammograms 05/26/2017:No concern for cancer, breast density category B Patient will need another mammogram  Maculopapular vesicles in the middle of the chest only in the left side of dermatome suspicious for shingles. I sent a prescription for Valtrex today. If it does not get better in a week then she will call her primary care physician.  Return to clinic in 1 year    No orders of the defined types were placed in this encounter.  The patient has a good understanding of the overall plan. she agrees with it. she will  call with any problems that may develop before the next visit here.   Harriette Ohara, MD 08/01/18

## 2018-08-02 ENCOUNTER — Other Ambulatory Visit: Payer: Self-pay | Admitting: Hematology and Oncology

## 2018-08-02 DIAGNOSIS — Z853 Personal history of malignant neoplasm of breast: Secondary | ICD-10-CM

## 2018-08-08 DIAGNOSIS — M25561 Pain in right knee: Secondary | ICD-10-CM | POA: Diagnosis not present

## 2018-08-08 DIAGNOSIS — M25562 Pain in left knee: Secondary | ICD-10-CM | POA: Diagnosis not present

## 2018-08-16 ENCOUNTER — Other Ambulatory Visit: Payer: Self-pay | Admitting: Hematology and Oncology

## 2018-08-16 ENCOUNTER — Other Ambulatory Visit: Payer: Self-pay

## 2018-08-16 DIAGNOSIS — Z853 Personal history of malignant neoplasm of breast: Secondary | ICD-10-CM

## 2018-08-18 ENCOUNTER — Ambulatory Visit
Admission: RE | Admit: 2018-08-18 | Discharge: 2018-08-18 | Disposition: A | Discharge status: Home / Self Care | Payer: Medicare Other | Source: Ambulatory Visit | Attending: Hematology and Oncology | Admitting: Hematology and Oncology

## 2018-08-18 DIAGNOSIS — Z853 Personal history of malignant neoplasm of breast: Secondary | ICD-10-CM | POA: Diagnosis not present

## 2018-08-18 DIAGNOSIS — R928 Other abnormal and inconclusive findings on diagnostic imaging of breast: Secondary | ICD-10-CM | POA: Diagnosis not present

## 2018-09-06 ENCOUNTER — Other Ambulatory Visit: Payer: Self-pay | Admitting: Internal Medicine

## 2018-10-20 ENCOUNTER — Encounter: Payer: Self-pay | Admitting: *Deleted

## 2018-10-20 ENCOUNTER — Other Ambulatory Visit: Payer: Self-pay | Admitting: *Deleted

## 2018-10-25 ENCOUNTER — Telehealth: Payer: Self-pay | Admitting: Internal Medicine

## 2018-10-25 NOTE — Telephone Encounter (Signed)
Pt left a vm requesting an rx for a vaginal infection. If someone could reach out to pt for further information.

## 2018-10-26 ENCOUNTER — Other Ambulatory Visit: Payer: Self-pay | Admitting: Internal Medicine

## 2018-10-26 DIAGNOSIS — B9689 Other specified bacterial agents as the cause of diseases classified elsewhere: Secondary | ICD-10-CM

## 2018-10-26 DIAGNOSIS — N760 Acute vaginitis: Principal | ICD-10-CM

## 2018-10-26 MED ORDER — METRONIDAZOLE 0.75 % VA GEL: 1.0000 | Freq: Every day | VAGINAL | 0 refills | Status: AC

## 2018-10-26 NOTE — Telephone Encounter (Signed)
RX sent

## 2018-10-26 NOTE — Telephone Encounter (Signed)
Pt is requesting a refill of the metronidazole gel. Pt states that she has a vaginal infection.   Please advise.

## 2018-10-26 NOTE — Telephone Encounter (Signed)
Pt informed rx has been sent.  

## 2018-11-18 ENCOUNTER — Telehealth: Payer: Self-pay

## 2018-11-18 NOTE — Telephone Encounter (Signed)
Nurse spoke with patient in regards to lump to right breast.  Pt reports hard lump to right breast X 2-3 days.  Denies any pain, swelling or redness to area.    Pt with h/o lumpectomy to right breast.  Nurse informed patient this will be reviewed by MD and we will reach out with additional recommendations. Pt voiced understanding and appreciative.

## 2018-11-21 ENCOUNTER — Encounter: Payer: Self-pay | Admitting: *Deleted

## 2018-11-21 ENCOUNTER — Other Ambulatory Visit: Payer: Self-pay | Admitting: *Deleted

## 2018-11-21 DIAGNOSIS — Z170 Estrogen receptor positive status [ER+]: Principal | ICD-10-CM

## 2018-11-21 DIAGNOSIS — C50211 Malignant neoplasm of upper-inner quadrant of right female breast: Secondary | ICD-10-CM

## 2018-11-29 ENCOUNTER — Other Ambulatory Visit: Payer: Self-pay

## 2018-11-29 ENCOUNTER — Telehealth: Payer: Self-pay | Admitting: Hematology and Oncology

## 2018-11-29 ENCOUNTER — Ambulatory Visit
Admission: RE | Admit: 2018-11-29 | Discharge: 2018-11-29 | Disposition: A | Discharge status: Home / Self Care | Payer: Medicare Other | Source: Ambulatory Visit | Attending: Hematology and Oncology | Admitting: Hematology and Oncology

## 2018-11-29 DIAGNOSIS — Z170 Estrogen receptor positive status [ER+]: Principal | ICD-10-CM

## 2018-11-29 DIAGNOSIS — C50211 Malignant neoplasm of upper-inner quadrant of right female breast: Secondary | ICD-10-CM | POA: Diagnosis not present

## 2018-11-29 NOTE — Telephone Encounter (Signed)
Left vm for patient to call back re: appt for 12/02/18.

## 2018-11-29 NOTE — Telephone Encounter (Signed)
Returned patient's phone call regarding voicemail that was left about Webex. Patient is notified and e-mail has been sent.

## 2018-11-29 NOTE — Assessment & Plan Note (Signed)
Rt Lumpectomy 11/14/14: IDC Grade 3, 2.7 cm size, 0/3 LN , Margins clear; T2N0 Stage IIA Right breast invasive ductal carcinoma diagnosed with screening mammogram, 3.1 cm by ultrasound in the upper quadrant 6 cm from the nipple, grade 2-3, EF 100%, PR 48%, HER-2 negative ratio 0.6, Ki-67 was 28%, Oncotype DX score 19, 12% risk of recurrence, status post radiation 4800 cGy in 24 sessions completed 01/29/2015  Treatment plan: antiestrogen therapy with anastrozole 1 mg daily started 02/14/2015  Anastrozole toxicities: Tolerating it very well without any side effects  Breast Cancer Surveillance: 1. Breast exam 08/01/2018: Normal 2. Mammogramsand ultrasound 11/29/2018:No concern for cancer, breast density categoryB  Maculopapular vesicles in the middle of the chest: Treated for shingles  Return to clinic in 1 year for follow-up

## 2018-12-01 ENCOUNTER — Telehealth: Payer: Self-pay | Admitting: Hematology and Oncology

## 2018-12-01 NOTE — Progress Notes (Signed)
HEMATOLOGY-ONCOLOGY [place] VISIT PROGRESS NOTE  I connected with Stacey Navarro on 12/02/2018 at 11:30 AM EDT by Webex video conference and verified that I am speaking with the correct person using two identifiers.  I discussed the limitations, risks, security and privacy concerns of performing an evaluation and management service by Webex and the availability of in person appointments.  I also discussed with the patient that there may be a patient responsible charge related to this service. The patient expressed understanding and agreed to proceed.  Patient's Location: Home Physician Location: Clinic  CHIEF COMPLIANT: Follow-up on anastrozole therapy  INTERVAL HISTORY: Stacey Navarro is a 78 y.o. female with above-mentioned history of right breast cancer treated with lumpectomy, radiation, and who is currently on anastrozole therapy. Mammogram and [place] on 11/29/18 following a palpable lump in the right breast showed no evidence of malignancy and benign fat necrosis. She presents today for follow-up.     Breast cancer of upper-inner quadrant of right female breast ([place])   10/19/2014 Mammogram    Right breast mass 3.1 cm upper quadrant, 6 cm from the nipple    10/19/2014 Initial Biopsy    Right breast core needle bx: invasive ductal carcinoma, grade 2-3, ER+ (100%), PR+ (48%), HER-2 negative (ratio 0.6), Ki-67 28%    10/20/2014 Clinical Stage    Stage IIA: T2 N0    11/13/2014 Definitive Surgery    Right Lumpectomy/SLNB Dalbert Batman): IDC, grade 3, 2.7 cm size, 3 LN removed and negative for malignancy (0/3) margins clear. HER2/neu repeated and remains negative (ratio 0.88).    11/13/2014 Pathologic Stage    Stage IIA: pT2 pN0    11/13/2014 Oncotype testing    Score 19 (12% ROR). No chemotherapy Lindi Adie).    12/26/2014 - 01/29/2015 Radiation Therapy    Adjuvant RT Valere Dross): Right breast 48 Gy over 24 sessions    02/14/2015 -  Anti-estrogen oral therapy    Anastrozole 1 mg daily. Planned duration of  therapy 5 years [place])     Survivorship    Survivorship care plan completed and mailed to patient in lieu of inperson visit.     REVIEW OF SYSTEMS:   Constitutional: Denies fevers, chills or abnormal weight loss Eyes: Denies blurriness of vision Ears, nose, mouth, throat, and face: Denies mucositis or sore throat Respiratory: Denies cough, dyspnea or wheezes Cardiovascular: Denies palpitation, chest discomfort Gastrointestinal:  Denies nausea, heartburn or change in bowel habits Skin: Denies abnormal skin rashes Lymphatics: Denies new lymphadenopathy or easy bruising Neurological:Denies numbness, tingling or new weaknesses Behavioral/Psych: Mood is stable, no new changes  Extremities: No lower extremity edema Breast: denies any pain or lumps or nodules in either breasts All other systems were reviewed with the patient and are negative.  Observations/Objective:  There were no vitals filed for this visit. There is no height or weight on file to calculate BMI.  I have reviewed the data as listed CMP Latest Ref Rng & Units 07/21/2017 03/16/2016 05/03/2015  Glucose 70 - 99 mg/dL 105(H) 108(H) 116(H)  BUN 6 - 23 mg/dL 20 21 29(H)  Creatinine 0.40 - 1.20 mg/dL 0.95 1.01 0.80  Sodium 135 - 145 mEq/L 140 139 138  Potassium 3.5 - 5.1 mEq/L 4.0 4.1 4.7  Chloride 96 - 112 mEq/L 103 101 110  CO2 19 - 32 mEq/L 29 27 -  Calcium 8.4 - 10.5 mg/dL 9.7 10.5 -  Total Protein 6.0 - 8.3 g/dL 7.4 7.8 -  Total Bilirubin 0.2 - 1.2 mg/dL 0.5 0.6 -  Alkaline Phos 39 - 117 U/L 103 126(H) -  AST 0 - 37 U/L 30 34 -  ALT 0 - 35 U/L 34 48(H) -    Lab Results  Component Value Date   WBC 6.7 07/21/2017   HGB 14.3 07/21/2017   HCT 43.9 07/21/2017   MCV 99.7 07/21/2017   PLT 275.0 07/21/2017   NEUTROABS 3.9 07/21/2017      Assessment Plan:  Breast cancer of upper-inner quadrant of right female breast Rt Lumpectomy 11/14/14: IDC Grade 3, 2.7 cm size, 0/3 LN , Margins clear; T2N0 Stage IIA Right  breast invasive ductal carcinoma diagnosed with screening mammogram, 3.1 cm by ultrasound in the upper quadrant 6 cm from the nipple, grade 2-3, EF 100%, PR 48%, HER-2 negative ratio 0.6, Ki-67 was 28%, Oncotype DX score 19, 12% risk of recurrence, status post radiation 4800 cGy in 24 sessions completed 01/29/2015  Treatment plan: antiestrogen therapy with anastrozole 1 mg daily started 02/14/2015, plan for 7 years.  Anastrozole toxicities: Tolerating it very well without any side effects  Breast Cancer Surveillance: 1. Breast exam 08/01/2018: Normal 2. Mammogramsand ultrasound 11/29/2018:No concern for cancer, breast density categoryB 3. Bone density:   Return to clinic in 1 year for follow-up    I discussed the assessment and treatment plan with the patient. The patient was provided an opportunity to ask questions and all were answered. The patient agreed with the plan and demonstrated an understanding of the instructions. The patient was advised to call back or seek an in-person evaluation if the symptoms worsen or if the condition fails to improve as anticipated.   I provided 15 minutes of face-to-face Web Ex time during this encounter.    Rulon Eisenmenger, MD 12/02/2018   I, Molly Dorshimer, am acting as scribe for [name], MD.  I have reviewed the above documentation for accuracy and completeness, and I agree with the above.

## 2018-12-01 NOTE — Telephone Encounter (Signed)
Spoke with patient and she's agreed to [employer] on 12/02/18.  Emailed step-by-step instructions how to download and access [employer] applications.  Will call if any questions.

## 2018-12-02 ENCOUNTER — Inpatient Hospital Stay: Payer: Medicare Other | Attending: Hematology and Oncology | Admitting: Hematology and Oncology

## 2018-12-02 DIAGNOSIS — Z170 Estrogen receptor positive status [ER+]: Secondary | ICD-10-CM | POA: Diagnosis not present

## 2018-12-02 DIAGNOSIS — C50211 Malignant neoplasm of upper-inner quadrant of right female breast: Secondary | ICD-10-CM | POA: Diagnosis not present

## 2018-12-02 DIAGNOSIS — Z780 Asymptomatic menopausal state: Secondary | ICD-10-CM | POA: Diagnosis not present

## 2018-12-05 ENCOUNTER — Telehealth: Payer: Self-pay | Admitting: Hematology and Oncology

## 2018-12-05 NOTE — Telephone Encounter (Signed)
Called regarding may

## 2019-01-30 ENCOUNTER — Telehealth (HOSPITAL_COMMUNITY): Payer: Self-pay | Admitting: Rehabilitation

## 2019-01-30 ENCOUNTER — Other Ambulatory Visit: Payer: Self-pay

## 2019-01-30 ENCOUNTER — Other Ambulatory Visit (INDEPENDENT_AMBULATORY_CARE_PROVIDER_SITE_OTHER): Payer: Medicare Other

## 2019-01-30 ENCOUNTER — Encounter: Payer: Self-pay | Admitting: Internal Medicine

## 2019-01-30 ENCOUNTER — Ambulatory Visit (INDEPENDENT_AMBULATORY_CARE_PROVIDER_SITE_OTHER): Payer: Medicare Other | Admitting: Internal Medicine

## 2019-01-30 ENCOUNTER — Ambulatory Visit (HOSPITAL_COMMUNITY)
Admission: RE | Admit: 2019-01-30 | Discharge: 2019-01-30 | Disposition: A | Discharge status: Home / Self Care | Payer: Medicare Other | Source: Ambulatory Visit | Attending: Family | Admitting: Family

## 2019-01-30 ENCOUNTER — Encounter (HOSPITAL_COMMUNITY): Payer: Self-pay

## 2019-01-30 VITALS — BP 130/70 | HR 71 | Temp 98.0°F | Ht 66.0 in | Wt 223.0 lb

## 2019-01-30 DIAGNOSIS — E785 Hyperlipidemia, unspecified: Secondary | ICD-10-CM | POA: Diagnosis not present

## 2019-01-30 DIAGNOSIS — K2100 Gastro-esophageal reflux disease with esophagitis, without bleeding: Secondary | ICD-10-CM

## 2019-01-30 DIAGNOSIS — I80202 Phlebitis and thrombophlebitis of unspecified deep vessels of left lower extremity: Secondary | ICD-10-CM | POA: Diagnosis not present

## 2019-01-30 DIAGNOSIS — R739 Hyperglycemia, unspecified: Secondary | ICD-10-CM

## 2019-01-30 DIAGNOSIS — I82402 Acute embolism and thrombosis of unspecified deep veins of left lower extremity: Secondary | ICD-10-CM

## 2019-01-30 DIAGNOSIS — I824Z2 Acute embolism and thrombosis of unspecified deep veins of left distal lower extremity: Secondary | ICD-10-CM | POA: Insufficient documentation

## 2019-01-30 LAB — COMPREHENSIVE METABOLIC PANEL
ALT: 41 U/L — ABNORMAL HIGH (ref 0–35)
AST: 29 U/L (ref 0–37)
Albumin: 4.2 g/dL (ref 3.5–5.2)
Alkaline Phosphatase: 118 U/L — ABNORMAL HIGH (ref 39–117)
BUN: 18 mg/dL (ref 6–23)
CO2: 26 mEq/L (ref 19–32)
Calcium: 10.1 mg/dL (ref 8.4–10.5)
Chloride: 105 mEq/L (ref 96–112)
Creatinine, Ser: 0.99 mg/dL (ref 0.40–1.20)
GFR: 54.49 mL/min — ABNORMAL LOW (ref >60.00)
Glucose, Bld: 111 mg/dL — ABNORMAL HIGH (ref 70–99)
Potassium: 4.3 mEq/L (ref 3.5–5.1)
Sodium: 140 mEq/L (ref 135–145)
Total Bilirubin: 0.7 mg/dL (ref 0.2–1.2)
Total Protein: 7.4 g/dL (ref 6.0–8.3)

## 2019-01-30 LAB — HEMOGLOBIN A1C: Hgb A1c MFr Bld: 6 % (ref 4.6–6.5)

## 2019-01-30 LAB — CBC WITH DIFFERENTIAL/PLATELET
Basophils Absolute: 0.1 10*3/uL (ref 0.0–0.1)
Basophils Relative: 1 % (ref 0.0–3.0)
Eosinophils Absolute: 0.2 10*3/uL (ref 0.0–0.7)
Eosinophils Relative: 2.9 % (ref 0.0–5.0)
HCT: 45.8 % (ref 36.0–46.0)
Hemoglobin: 15.5 g/dL — ABNORMAL HIGH (ref 12.0–15.0)
Lymphocytes Relative: 26.9 % (ref 12.0–46.0)
Lymphs Abs: 1.7 10*3/uL (ref 0.7–4.0)
MCHC: 33.7 g/dL (ref 30.0–36.0)
MCV: 97.2 fl (ref 78.0–100.0)
Monocytes Absolute: 0.7 10*3/uL (ref 0.1–1.0)
Monocytes Relative: 10.9 % (ref 3.0–12.0)
Neutro Abs: 3.7 10*3/uL (ref 1.4–7.7)
Neutrophils Relative %: 58.3 % (ref 43.0–77.0)
Platelets: 258 10*3/uL (ref 150.0–400.0)
RBC: 4.72 Mil/uL (ref 3.87–5.11)
RDW: 14.1 % (ref 11.5–15.5)
WBC: 6.4 10*3/uL (ref 4.0–10.5)

## 2019-01-30 LAB — LIPID PANEL
Cholesterol: 237 mg/dL — ABNORMAL HIGH (ref 0–200)
HDL: 58.2 mg/dL (ref >39.00)
LDL Cholesterol: 159 mg/dL — ABNORMAL HIGH (ref 0–99)
NonHDL: 178.31
Total CHOL/HDL Ratio: 4
Triglycerides: 98 mg/dL (ref 0.0–149.0)
VLDL: 19.6 mg/dL (ref 0.0–40.0)

## 2019-01-30 LAB — SEDIMENTATION RATE: Sed Rate: 30 mm/hr (ref 0–30)

## 2019-01-30 LAB — TSH: TSH: 3.45 u[IU]/mL (ref 0.35–4.50)

## 2019-01-30 MED ORDER — RIVAROXABAN (XARELTO) VTE STARTER PACK (15 & 20 MG): ORAL_TABLET | ORAL | 0 refills | Status: DC

## 2019-01-30 MED ORDER — ROSUVASTATIN CALCIUM 20 MG PO TABS: 20.0000 mg | ORAL_TABLET | Freq: Every day | ORAL | 1 refills | Status: DC

## 2019-01-30 NOTE — Patient Instructions (Signed)
Thrombophlebitis Thrombophlebitis is a condition in which a blood clot forms in a vein. This can happen in your arms or legs, or in the area between your neck and groin (torso). When this condition happens in a vein that is close to the surface of the body (superficial thrombophlebitis), it is usually not serious.However, when the condition happens in a vein that is deep inside the body (deep vein thrombosis, DVT), it can cause serious problems. What are the causes? This condition may be caused by:  Damage to a vein.  Inflammation of the veins.  A condition that causes blood to clot more easily.  Reduced blood flow through the veins. What increases the risk? The following factors may make you more likely to develop this condition:  Having a condition that makes blood thicker or more likely to clot.  Having an infection.  Having major surgery.  Experiencing a traumatic injury or a broken bone.  Having a catheter in a vein (central line).  Having a condition in which valves in the veins do not work properly, causing blood to collect (pool) in the veins (chronic venous insufficiency).  An inactive (sedentary) lifestyle.  Pregnancy or having recently given birth.  Cancer.  Older age, especially being 66 or older.  Obesity.  Smoking.  Taking medicines that contain estrogen, such as birth control pills.  Having varicose veins.  Using drugs that are injected into the veins (intravenous, IV). What are the signs or symptoms? The main symptoms of this condition are:  Swelling and pain in an arm or leg. If the affected vein is in the leg, you may feel pain while standing or walking.  Warmth or redness in an arm or leg. Other symptoms include:  Low-grade fever.  Muscle aches.  A bulging vein (venous distension). In some cases, there are no symptoms. How is this diagnosed? This condition may be diagnosed based on:  Your symptoms and medical history.  A physical exam.   Tests, such as: ? Blood tests. ? A test that uses sound waves to make images (ultrasound). How is this treated? Treatment depends on how severe the condition is and which area of the body is affected. Treatment may include:  Applying a warm compress or heating pad to affected areas.  Wearing compression stockings to help prevent blood clots and reduce swelling in your legs.  Raising (elevating) the affected arm or leg above the level of your heart.  Medicines, such as: ? Anti-inflammatory medicines, such as ibuprofen. ? Blood thinners (anticoagulants), such as heparin. ? Antibiotic medicine, if you have an infection.  Removing an IV that may be causing the problem. In rare cases, surgery may be needed to:  Remove a damaged section of a vein.  Place a filter in a large vein to catch blood clots before they reach the lungs. Follow these instructions at home: Medicines  Take over-the-counter and prescription medicines only as told by your health care provider.  If you were prescribed an antibiotic, take it as told by your health care provider. Do not stop using the antibiotic even if you feel better. Managing pain, stiffness, and swelling   If directed, put heat on the affected area as often as told by your health care provider. Use the heat source that your health care provider recommends, such as a moist heat pack or a heating pad. ? Place a towel between your skin and the heat source. ? Leave the heat on for 20-30 minutes. ? Remove the heat  if your skin turns bright red. This is especially important if you are not able to feel pain, heat, or cold. You may have a greater risk of getting burned.  Elevate the affected area above the level of your heart while you are sitting or lying down. Activity  Return to your normal activities as told by your health care provider. Ask your health care provider what activities are safe for you.  Avoid sitting or lying down for long  periods. If possible, stand up and walk around regularly. If you are taking blood thinners:  Take your medicine exactly as told, at the same time every day.  Avoid activities that could cause injury or bruising, and follow instructions about how to prevent falls.  Wear a medical alert bracelet or carry a card that lists what medicines you take. General instructions  Drink enough fluid to keep your urine pale yellow.  Wear compression stockings as told by your health care provider.  Do not use any products that contain nicotine or tobacco, such as cigarettes and e-cigarettes. If you need help quitting, ask your health care provider.  Keep all follow-up visits as told by your health care provider. This is important. Contact a health care provider if:  You miss a dose of your blood thinner, if applicable.  Your symptoms do not improve.  You have unusual bruising.  You have nausea, vomiting, or diarrhea that lasts for more than one day. Get help right away if:  You have any of these problems: ? New or worse pain, swelling, or redness in an arm or leg. ? Numbness or tingling in an arm or leg. ? Shortness of breath. ? Chest pain. ? Severe pain in your abdomen. ? Fast breathing. ? A fast or irregular heartbeat. ? Blood in your vomit, stool, or urine. ? A severe headache or confusion. ? A cut that does not stop bleeding.  You feel light-headed or dizzy.  You cough up blood.  You have a serious fall or accident, or you hit your head. These symptoms may represent a serious problem that is an emergency. Do not wait to see if the symptoms will go away. Get medical help right away. Call your local emergency services (911 in the [place] Do not drive yourself to the hospital. Summary  Thrombophlebitis is a condition in which a blood clot forms in a vein. This can happen in a vein close to the surface of the body or a vein deep inside the body.  This condition can cause serious  problems when it happens in a vein deep inside the body (deep vein thrombosis, DVT).  The main symptom of this condition is swelling and pain around the affected vein.  Treatment may include warm compresses, anti-inflammatory medicines, or blood thinners. This information is not intended to replace advice given to you by your health care provider. Make sure you discuss any questions you have with your health care provider. Document Released: 01/06/2017 Document Revised: 11/02/2018 Document Reviewed: 01/06/2017 Elsevier Patient Education  2020 [employer].

## 2019-01-30 NOTE — Telephone Encounter (Signed)

## 2019-01-30 NOTE — Progress Notes (Signed)
Dr. Geni Bers office notified of positive DVT study performed at [facility] at [facility] at 3:50pm. Per office instructions, patient advised to return to ordering provider's office.

## 2019-01-30 NOTE — Progress Notes (Signed)
Subjective:  Patient ID: Stacey Navarro, female    DOB: 1940-11-12  Age: 78 y.o. MRN: [id number]  CC: Hyperlipidemia   HPI Stacey Navarro presents for f/up - She complains of a one-week history of nontraumatic pain, swelling, and a palpable cord in her left calf.  She denies claudication.  Outpatient Medications Prior to Visit  Medication Sig Dispense Refill   anastrozole (ARIMIDEX) 1 MG tablet Take 1 tablet (1 mg total) by mouth daily. 90 tablet 3   calcium carbonate (OS-CAL) 1250 (500 CA) MG chewable tablet Chew 1 tablet by mouth daily.     cholecalciferol (VITAMIN D) 1000 UNITS tablet Take 1,000 Units by mouth daily.     cycloSPORINE (RESTASIS) 0.05 % ophthalmic emulsion Place 1 drop into both eyes 2 (two) times daily.     pantoprazole (PROTONIX) 40 MG tablet TAKE 1 TABLET BY MOUTH ONCE DAILY 90 tablet 1   potassium chloride SA (K-DUR,KLOR-CON) 20 MEQ tablet Take 1 tablet (20 mEq total) by mouth daily. 30 tablet 0   vitamin E 400 UNIT capsule Take 400 Units by mouth daily.     meloxicam (MOBIC) 15 MG tablet TAKE 1 TABLET BY MOUTH ONCE DAILY WITH FOOD FOR 2 WEEKS THEN AS NEEDED  1   furosemide (LASIX) 20 MG tablet Take 1 tablet (20 mg total) by mouth daily as needed for fluid or edema. (Patient not taking: Reported on 01/30/2019) 30 tablet 0   valACYclovir (VALTREX) 500 MG tablet Take 1 tablet (500 mg total) by mouth 2 (two) times daily. (Patient not taking: Reported on 01/30/2019) 20 tablet 0   No facility-administered medications prior to visit.     ROS Review of Systems  Constitutional: Negative for diaphoresis, fatigue and unexpected weight change.  HENT: Negative.   Eyes: Negative for visual disturbance.  Respiratory: Negative for cough, chest tightness, shortness of breath and wheezing.   Cardiovascular: Positive for leg swelling. Negative for chest pain and palpitations.  Gastrointestinal: Negative for abdominal pain, constipation, diarrhea, nausea and vomiting.    Endocrine: Negative.   Genitourinary: Negative.  Negative for difficulty urinating.  Musculoskeletal: Positive for arthralgias.  Skin: Negative.  Negative for color change and pallor.  Neurological: Negative.  Negative for dizziness, weakness and light-headedness.  Hematological: Negative for adenopathy. Does not bruise/bleed easily.  Psychiatric/Behavioral: Negative.     Objective:  BP 130/70 (BP Location: Left Arm, Patient Position: Sitting, Cuff Size: Large)    Pulse 71    Temp 98 F (36.7 C) (Oral)    Ht 5' 6"  (1.676 m)    Wt 223 lb (101.2 kg)    SpO2 95%    BMI 35.99 kg/m   BP Readings from Last 3 Encounters:  01/30/19 130/70  08/01/18 138/71  05/18/18 132/74    Wt Readings from Last 3 Encounters:  01/30/19 223 lb (101.2 kg)  08/01/18 224 lb 1.6 oz (101.7 kg)  05/18/18 214 lb 1.9 oz (97.1 kg)    Physical Exam Vitals signs reviewed.  Constitutional:      Appearance: She is obese. She is not ill-appearing or diaphoretic.  HENT:     Nose: Nose normal.     Mouth/Throat:     Mouth: Mucous membranes are moist.     Pharynx: No oropharyngeal exudate or posterior oropharyngeal erythema.  Eyes:     General: No scleral icterus.    Conjunctiva/sclera: Conjunctivae normal.  Neck:     Musculoskeletal: Normal range of motion. No neck rigidity or muscular tenderness.  Cardiovascular:     Rate and Rhythm: Normal rate.     Pulses:          Carotid pulses are 1+ on the right side and 1+ on the left side.      Radial pulses are 1+ on the right side and 1+ on the left side.       Femoral pulses are 1+ on the right side and 1+ on the left side.      Popliteal pulses are 1+ on the right side and 1+ on the left side.       Dorsalis pedis pulses are 1+ on the right side and 1+ on the left side.       Posterior tibial pulses are 1+ on the right side and 1+ on the left side.     Heart sounds: No murmur.  Pulmonary:     Effort: Pulmonary effort is normal.     Breath sounds: No stridor.  No wheezing, rhonchi or rales.  Abdominal:     General: Abdomen is protuberant. Bowel sounds are normal.     Palpations: There is no hepatomegaly, splenomegaly or mass.     Tenderness: There is no abdominal tenderness.  Musculoskeletal: Normal range of motion.     Right lower leg: No edema.     Left lower leg: No edema.       Legs:  Lymphadenopathy:     Cervical: No cervical adenopathy.  Skin:    General: Skin is warm.     Findings: No rash.  Neurological:     General: No focal deficit present.  Psychiatric:        Mood and Affect: Mood normal.        Behavior: Behavior normal.     Lab Results  Component Value Date   WBC 6.4 01/30/2019   HGB 15.5 (H) 01/30/2019   HCT 45.8 01/30/2019   PLT 258.0 01/30/2019   GLUCOSE 111 (H) 01/30/2019   CHOL 237 (H) 01/30/2019   TRIG 98.0 01/30/2019   HDL 58.20 01/30/2019   LDLDIRECT 158.4 07/17/2011   LDLCALC 159 (H) 01/30/2019   ALT 41 (H) 01/30/2019   AST 29 01/30/2019   NA 140 01/30/2019   K 4.3 01/30/2019   CL 105 01/30/2019   CREATININE 0.99 01/30/2019   BUN 18 01/30/2019   CO2 26 01/30/2019   TSH 3.45 01/30/2019   HGBA1C 6.0 01/30/2019    US Breast Ltd Uni Right Inc Axilla  Result Date: 11/29/2018 CLINICAL DATA:  78 year old patient with history lumpectomy and radiation therapy in 2016 presents for evaluation of palpable area of firmness along the medial aspect of the lumpectomy scar. She had a negative annual mammogram in January of 2020. EXAM: DIGITAL DIAGNOSTIC RIGHT MAMMOGRAM WITH CAD AND TOMO ULTRASOUND RIGHT BREAST COMPARISON:  Previous exam(s). ACR Breast Density Category b: There are scattered areas of fibroglandular density. FINDINGS: Lumpectomy scarring in the upper inner right breast. Deep to the patient's area of palpable concern along the medial half of the scar is the densest area of scar tissue, with associated fatty lucency with peripheral calcification consistent with fat necrosis. No suspicious mammographic  change in the appearance of the lumpectomy scar compared to priors. No suspicious mass, suspicious microcalcification, or nonsurgical distortion is identified in the right breast. Atherosclerotic calcifications are noted. Mild skin thickening of the right breast anteriorly, consistent with radiation therapy. Mammographic images were processed with CAD. On physical exam, there is a long, well-healed lumpectomy scar in the  upper inner right breast. Along the more medial aspect of the lumpectomy scar, I do palpate superficial flat firmness in the region of patient concern. Targeted ultrasound is performed, showing a typical ultrasound appearance of lumpectomy scarring, with nonvascular hypoechoic areas, with linear focal extension to the skin, in the area of palpable concern. IMPRESSION: Imaging findings suggest benign lumpectomy/fat necrosis and radiation changes in the upper inner right breast in the region of patient concern. No suspicious findings. RECOMMENDATION: Bilateral diagnostic mammogram is recommended in January 2021. If the patient's treated breast feels different than it has in the past on clinical examination, further evaluation could be considered. I have discussed the findings and recommendations with the patient. Results were also provided in writing at the conclusion of the visit. If applicable, a reminder letter will be sent to the patient regarding the next appointment. BI-RADS CATEGORY  2: Benign. Electronically Signed   By: Curlene Dolphin M.D.   On: 11/29/2018 09:46   Mm Diag Breast Tomo Uni Right  Result Date: 11/29/2018 CLINICAL DATA:  78 year old patient with history lumpectomy and radiation therapy in 2016 presents for evaluation of palpable area of firmness along the medial aspect of the lumpectomy scar. She had a negative annual mammogram in January of 2020. EXAM: DIGITAL DIAGNOSTIC RIGHT MAMMOGRAM WITH CAD AND TOMO ULTRASOUND RIGHT BREAST COMPARISON:  Previous exam(s). ACR Breast Density  Category b: There are scattered areas of fibroglandular density. FINDINGS: Lumpectomy scarring in the upper inner right breast. Deep to the patient's area of palpable concern along the medial half of the scar is the densest area of scar tissue, with associated fatty lucency with peripheral calcification consistent with fat necrosis. No suspicious mammographic change in the appearance of the lumpectomy scar compared to priors. No suspicious mass, suspicious microcalcification, or nonsurgical distortion is identified in the right breast. Atherosclerotic calcifications are noted. Mild skin thickening of the right breast anteriorly, consistent with radiation therapy. Mammographic images were processed with CAD. On physical exam, there is a long, well-healed lumpectomy scar in the upper inner right breast. Along the more medial aspect of the lumpectomy scar, I do palpate superficial flat firmness in the region of patient concern. Targeted ultrasound is performed, showing a typical ultrasound appearance of lumpectomy scarring, with nonvascular hypoechoic areas, with linear focal extension to the skin, in the area of palpable concern. IMPRESSION: Imaging findings suggest benign lumpectomy/fat necrosis and radiation changes in the upper inner right breast in the region of patient concern. No suspicious findings. RECOMMENDATION: Bilateral diagnostic mammogram is recommended in January 2021. If the patient's treated breast feels different than it has in the past on clinical examination, further evaluation could be considered. I have discussed the findings and recommendations with the patient. Results were also provided in writing at the conclusion of the visit. If applicable, a reminder letter will be sent to the patient regarding the next appointment. BI-RADS CATEGORY  2: Benign. Electronically Signed   By: Curlene Dolphin M.D.   On: 11/29/2018 09:46    Assessment & Plan:   Kenidee was seen today for  hyperlipidemia.  Diagnoses and all orders for this visit:  Hyperlipidemia with target LDL less than 130- She has an elevated ASCVD risk score so I have asked her to start taking a statin for CV risk reduction. -     Lipid panel; Future -     Comprehensive metabolic panel; Future -     TSH; Future -     rosuvastatin (CRESTOR) 20 MG tablet;  Take 1 tablet (20 mg total) by mouth daily.  Hyperglycemia- Her A1c is 6.0%.  She is prediabetic.  Medical therapy is not indicated.  She will improve her lifestyle modifications. -     Hemoglobin A1c; Future  Gastroesophageal reflux disease with esophagitis- No complications noted. -     CBC with Differential/Platelet; Future  Thrombophlebitis of deep vein of left lower leg (HCC)- Her d-dimer is elevated and the ultrasound is positive for DVT. -     D-dimer, quantitative (not at [facility]); Future -     Sedimentation rate; Future -     VAS [place] LOWER EXTREMITY VENOUS (DVT); Future  DVT, recurrent, lower extremity, acute, left (HCC) -     Rivaroxaban 15 & 20 MG TBPK; Take as directed on package: Start with one 5m tablet by mouth twice a day with food. On Day 22, switch to one 251mtablet once a day with food.   I have discontinued MaJoycelyn Schmid. Lui's meloxicam. I am also having her start on rosuvastatin and Rivaroxaban. Additionally, I am having her maintain her cholecalciferol, vitamin E, calcium carbonate, cycloSPORINE, furosemide, potassium chloride SA, valACYclovir, anastrozole, and pantoprazole.  Meds ordered this encounter  Medications   rosuvastatin (CRESTOR) 20 MG tablet    Sig: Take 1 tablet (20 mg total) by mouth daily.    Dispense:  90 tablet    Refill:  1   Rivaroxaban 15 & 20 MG TBPK    Sig: Take as directed on package: Start with one 1573mablet by mouth twice a day with food. On Day 22, switch to one 88m59mblet once a day with food.    Dispense:  51 each    Refill:  0     Follow-up: Return in about 1 week (around  02/06/2019).  ThomScarlette Calico

## 2019-01-31 LAB — D-DIMER, QUANTITATIVE: D-Dimer, Quant: 3.72 mcg/mL FEU — ABNORMAL HIGH (ref <0.50)

## 2019-02-24 ENCOUNTER — Telehealth: Payer: Self-pay | Admitting: Internal Medicine

## 2019-02-24 NOTE — Telephone Encounter (Signed)
Pt is almost done with her samples of Xarelto. Can you send rx to [facility] on [address].

## 2019-02-24 NOTE — Telephone Encounter (Signed)
Copied from [place] [id number]. Topic: Quick Communication - Rx Refill/Question >> Feb 24, 2019  9:51 AM Nils Flack wrote: Medication: xalreto  Has the patient contacted their pharmacy? No. (Agent: If no, request that the patient contact the pharmacy for the refill.) (Agent: If yes, when and what did the pharmacy advise?)  Preferred Pharmacy (with phone number or street name): [facility] [address]  Pt was given a sample pack and is almost out   Agent: Please be advised that RX refills may take up to 3 business days. We ask that you follow-up with your pharmacy.

## 2019-02-25 ENCOUNTER — Other Ambulatory Visit: Payer: Self-pay | Admitting: Internal Medicine

## 2019-02-25 DIAGNOSIS — I824Z2 Acute embolism and thrombosis of unspecified deep veins of left distal lower extremity: Secondary | ICD-10-CM

## 2019-02-25 MED ORDER — RIVAROXABAN 20 MG PO TABS: 20.0000 mg | ORAL_TABLET | Freq: Every day | ORAL | 1 refills | Status: DC

## 2019-02-25 NOTE — Telephone Encounter (Signed)
rx sent

## 2019-02-27 NOTE — Telephone Encounter (Signed)
Pt informed rx has been sent.  

## 2019-02-28 ENCOUNTER — Other Ambulatory Visit: Payer: Self-pay

## 2019-02-28 ENCOUNTER — Ambulatory Visit: Payer: Self-pay | Admitting: *Deleted

## 2019-02-28 ENCOUNTER — Ambulatory Visit (INDEPENDENT_AMBULATORY_CARE_PROVIDER_SITE_OTHER): Payer: Medicare Other | Admitting: Family

## 2019-02-28 ENCOUNTER — Encounter: Payer: Self-pay | Admitting: Family

## 2019-02-28 VITALS — BP 132/70 | HR 69 | Temp 98.1°F | Ht 66.0 in | Wt 223.0 lb

## 2019-02-28 DIAGNOSIS — I82402 Acute embolism and thrombosis of unspecified deep veins of left lower extremity: Secondary | ICD-10-CM

## 2019-02-28 DIAGNOSIS — T50905A Adverse effect of unspecified drugs, medicaments and biological substances, initial encounter: Secondary | ICD-10-CM | POA: Diagnosis not present

## 2019-02-28 MED ORDER — FAMOTIDINE 20 MG PO TABS: 20.0000 mg | ORAL_TABLET | Freq: Two times a day (BID) | ORAL | 0 refills | Status: DC

## 2019-02-28 MED ORDER — RADIAPLEXRX EX GEL: 1.0000 "application " | Freq: Three times a day (TID) | CUTANEOUS | 0 refills | Status: DC

## 2019-02-28 NOTE — Telephone Encounter (Signed)
Patient is experiencing rash- bumpy and itching- patient just started the Xarelto and thinks it may be related. Patient is using Radiaplex and seems to be helping. It is Rx- she is requesting a refill.  Call to office for appointment.  Reason for Disposition . Hives or itching  Answer Assessment - Initial Assessment Questions 1. APPEARANCE of RASH: "Describe the rash." (e.g., spots, blisters, raised areas, skin peeling, scaly)     Upper arms, shoulders and chest- look like red bumps 2. SIZE: "How big are the spots?" (e.g., tip of pen, eraser, coin; inches, centimeters)     Little pinprick bumps 3. LOCATION: "Where is the rash located?"     See location 4. COLOR: "What color is the rash?" (Note: It is difficult to assess rash color in people with darker-colored skin. When this situation occurs, simply ask the caller to describe what they see.)     Pinkish in color-do not stand out as red 5. ONSET: "When did the rash begin?"     Started last Thursday- patient did not think of medication- she has never had a medication reaction 6. FEVER: "Do you have a fever?" If so, ask: "What is your temperature, how was it measured, and when did it start?"     no 7. ITCHING: "Does the rash itch?" If so, ask: "How bad is the itch?" (Scale 1-10; or mild, moderate, severe)     Yes-  severe 8. CAUSE: "What do you think is causing the rash?"     Possible medication reaction 9. NEW MEDICATION: "What new medication are you taking?" (e.g., name of antibiotic) "When did you start taking this medication?".     xarelto- 3 weeks ago 10. OTHER SYMPTOMS: "Do you have any other symptoms?" (e.g., sore throat, fever, joint pain)       Feels like she "over exercised" 11. PREGNANCY: "Is there any chance you are pregnant?" "When was your last menstrual period?"       n/a  Protocols used: RASH - WIDESPREAD ON DRUGS-A-AH

## 2019-02-28 NOTE — Patient Instructions (Signed)
Please stop both the Xarelto and Crestor; since you started them both new at the same time, I cannot definitively tell you which medication is causing the problems.

## 2019-02-28 NOTE — Progress Notes (Signed)
Stacey Navarro is a 78 y.o. female with the following history as recorded in EpicCare:  Patient Active Problem List   Diagnosis Date Noted  . Lower leg DVT (deep venous thromboembolism), acute, left ([place]) 01/30/2019  . Varicose veins of bilateral lower extremities with other complications 20/35/5974  . Hyperglycemia 03/16/2016  . Obesity (BMI 35.0-39.9 without comorbidity) 03/16/2016  . Carpal tunnel syndrome 02/12/2015  . Breast cancer of upper-inner quadrant of right female breast ([place]) 10/23/2014  . Osteoarthritis of right knee 12/14/2012  . GERD (gastroesophageal reflux disease) 07/17/2011  . Hyperlipidemia with target LDL less than 130 07/17/2011  . Visit for screening mammogram 07/17/2011  . Routine general medical examination at a health care facility 07/17/2011    Current Outpatient Medications  Medication Sig Dispense Refill  . anastrozole (ARIMIDEX) 1 MG tablet Take 1 tablet (1 mg total) by mouth daily. 90 tablet 3  . calcium carbonate (OS-CAL) 1250 (500 CA) MG chewable tablet Chew 1 tablet by mouth daily.    . cholecalciferol (VITAMIN D) 1000 UNITS tablet Take 1,000 Units by mouth daily.    . cycloSPORINE (RESTASIS) 0.05 % ophthalmic emulsion Place 1 drop into both eyes 2 (two) times daily.    . pantoprazole (PROTONIX) 40 MG tablet TAKE 1 TABLET BY MOUTH ONCE DAILY 90 tablet 1  . potassium chloride SA (K-DUR,KLOR-CON) 20 MEQ tablet Take 1 tablet (20 mEq total) by mouth daily. 30 tablet 0  . rivaroxaban (XARELTO) 20 MG TABS tablet Take 1 tablet (20 mg total) by mouth daily with supper. 90 tablet 1  . rosuvastatin (CRESTOR) 20 MG tablet Take 1 tablet (20 mg total) by mouth daily. 90 tablet 1  . vitamin E 400 UNIT capsule Take 400 Units by mouth daily.    . famotidine (PEPCID) 20 MG tablet Take 1 tablet (20 mg total) by mouth 2 (two) times daily. 30 tablet 0  . furosemide (LASIX) 20 MG tablet Take 1 tablet (20 mg total) by mouth daily as needed for fluid or edema. (Patient not  taking: Reported on 01/30/2019) 30 tablet 0  . hyaluronate sodium (RADIAPLEXRX) GEL Apply 1 application topically 3 (three) times daily. 170 g 0  . valACYclovir (VALTREX) 500 MG tablet Take 1 tablet (500 mg total) by mouth 2 (two) times daily. (Patient not taking: Reported on 01/30/2019) 20 tablet 0   No current facility-administered medications for this visit.     Allergies: Patient has no known allergies.  Past Medical History:  Diagnosis Date  . Arthritis 2010   OA in knees  . Breast cancer of upper-inner quadrant of right female breast ([place]) 10/23/2014  . Carpal tunnel syndrome 02/12/2015   Right  . GERD (gastroesophageal reflux disease)   . Hyperlipidemia   . Personal history of radiation therapy   . Primary localized osteoarthritis of left knee 07/12/2015  . Primary localized osteoarthritis of right knee 05/03/2015  . S/P radiation therapy 12/26/2014 through 01/29/2015    Right breast 4800 cGy in 24 sessions     Past Surgical History:  Procedure Laterality Date  . BREAST LUMPECTOMY Right 11/13/2014  . CHOLECYSTECTOMY  2011  . DILATION AND CURETTAGE OF UTERUS    . PARTIAL KNEE ARTHROPLASTY Right 05/03/2015   Procedure: RIGHT UNICOMPARTMENTAL KNEE;  Surgeon: Marchia Bond, MD;  Location: [facility];  Service: Orthopedics;  Laterality: Right;  . PARTIAL KNEE ARTHROPLASTY Left 07/12/2015   Procedure: LEFT UNICOMPARTMENTAL KNEE;  Surgeon: Marchia Bond, MD;  Location: [place];  Service: Orthopedics;  Laterality: Left;  . RADIOACTIVE SEED GUIDED PARTIAL MASTECTOMY WITH AXILLARY SENTINEL LYMPH NODE BIOPSY Right 11/13/2014   Procedure: RIGHT PARTIAL MASTECTOMY WITH RADIOACTIVE SEED LOCALIZATION RIGHT  AXILLARY SENTINEL LYMPH NODE BIOPSY;  Surgeon: [name], MD;  Location: [place];  Service: General;  Laterality: Right;  . TONSILLECTOMY  1948  . TUBAL LIGATION       Family History  Problem Relation Age of Onset  . Hypertension Mother   . Alcohol abuse Father   . Breast cancer Maternal Aunt     Social History   Tobacco Use  . Smoking status: Former Smoker    Packs/day: 1.00    Types: Cigarettes    Quit date: 11/06/1995    Years since quitting: 23.3  . Smokeless tobacco: Never Used  Substance Use Topics  . Alcohol use: Yes    Alcohol/week: 2.0 standard drinks    Types: 2 Glasses of wine per week    Comment: occ    Subjective:  Patient presents with concerns for rash on her chest/ upper arms that started in the past week; has recently started both Xarelto and Crestor as new medications; started both of these medications at the same time; denies any other new soaps, detergents, plant exposure or food; is very uncomfortable with taking prednisone; denies any difficulty swallowing or breathing; has no other medication allergies;   Objective:  Vitals:   02/28/19 1236  BP: 132/70  Pulse: 69  Temp: 98.1 F (36.7 C)  TempSrc: Oral  SpO2: 96%  Weight: 223 lb (101.2 kg)  Height: 5' 6"  (1.676 m)    General: Well developed, well nourished, in no acute distress  Skin : Warm and dry. Erythematous papular rash noted on chest, upper arms;  Head: Normocephalic and atraumatic  Eyes: Sclera and conjunctiva clear; pupils round and reactive to light; extraocular movements intact  Ears: External normal; canals clear; tympanic membranes normal  Oropharynx: Pink, supple. No suspicious lesions  Neck: Supple without thyromegaly, adenopathy  Lungs: Respirations unlabored; Neurologic: Alert and oriented; speech intact; face symmetrical; moves all extremities well;   Assessment:  1. Adverse effect of drug, initial encounter   2. DVT, recurrent, lower extremity, acute, left ([place])     Plan:  Patient does not want to use oral prednisone; refill given on Radiaplex gel and will use Claritin and Pepcid for antihistamine benefit.  Will stop both Xarelto and  Crestor since they were both started at same time; unable to differentiate which medication could be causing symptoms;  Change to Eliquis 5 mg bid/ leaving for vacation tomorrow and do not feel Coumadin is best choice- she has to stay on anti-coagulant as she was found to have her DVT in the past month; she understands to start this tomorrow when she would have taken her Xarelto.  She will re-introduce the Crestor once she returns from vacation next week and see how she does. If rash returns, will feel more confident that Crestor is the problem.  Follow-up to be determined- will need to see her PCP in about 1 month.   No follow-ups on file.  No orders of the defined types were placed in this encounter.   Requested Prescriptions   Signed Prescriptions Disp Refills  . hyaluronate sodium (RADIAPLEXRX) GEL 170 g 0    Sig: Apply 1 application topically 3 (three) times daily.  . famotidine (PEPCID) 20 MG tablet 30 tablet 0    Sig: Take 1 tablet (20 mg total) by  mouth 2 (two) times daily.

## 2019-03-08 ENCOUNTER — Other Ambulatory Visit: Payer: Self-pay | Admitting: Hematology and Oncology

## 2019-03-08 DIAGNOSIS — E2839 Other primary ovarian failure: Secondary | ICD-10-CM

## 2019-03-10 ENCOUNTER — Other Ambulatory Visit: Payer: Self-pay

## 2019-03-10 ENCOUNTER — Ambulatory Visit
Admission: RE | Admit: 2019-03-10 | Discharge: 2019-03-10 | Disposition: A | Discharge status: Home / Self Care | Payer: Medicare Other | Source: Ambulatory Visit | Attending: Hematology and Oncology | Admitting: Hematology and Oncology

## 2019-03-10 DIAGNOSIS — M810 Age-related osteoporosis without current pathological fracture: Secondary | ICD-10-CM | POA: Diagnosis not present

## 2019-03-10 DIAGNOSIS — M8589 Other specified disorders of bone density and structure, multiple sites: Secondary | ICD-10-CM | POA: Diagnosis not present

## 2019-03-10 DIAGNOSIS — Z780 Asymptomatic menopausal state: Secondary | ICD-10-CM | POA: Diagnosis not present

## 2019-03-10 DIAGNOSIS — E2839 Other primary ovarian failure: Secondary | ICD-10-CM

## 2019-03-10 LAB — HM DEXA SCAN: HM Dexa Scan: -2.5

## 2019-03-13 ENCOUNTER — Telehealth: Payer: Self-pay | Admitting: Internal Medicine

## 2019-03-13 MED ORDER — APIXABAN 5 MG PO TABS: 5.0000 mg | ORAL_TABLET | Freq: Two times a day (BID) | ORAL | 2 refills | Status: DC

## 2019-03-13 NOTE — Telephone Encounter (Signed)
Pt called In to update provider on how she is doing with the new medication change. Pt says that the Eliquis is working well. Pt would like to know if she is suppose to start back taking Cholesterol medication because her rash has cleared up?    CB: [id number]

## 2019-03-13 NOTE — Telephone Encounter (Signed)
Yes, she can re-start the Crestor. We will list Xarelto as an allergy. I will update Rx for Eliquis for her. Keep planned follow-up with Dr. Ronnald Ramp.

## 2019-03-13 NOTE — Telephone Encounter (Signed)
Pt saw Mickel Baas on 02/28/2019 for drug interaction.  Pt states that she if feeling better and wanted to know if she should restart the crestor?

## 2019-03-15 MED ORDER — APIXABAN 5 MG PO TABS: 5.0000 mg | ORAL_TABLET | Freq: Two times a day (BID) | ORAL | 2 refills | Status: DC

## 2019-03-15 NOTE — Telephone Encounter (Signed)
Pt contacted and informed of same.   Pt informed eliquis rx was updated.

## 2019-04-11 ENCOUNTER — Other Ambulatory Visit: Payer: Self-pay | Admitting: Internal Medicine

## 2019-05-17 ENCOUNTER — Telehealth: Payer: Self-pay | Admitting: Internal Medicine

## 2019-05-17 NOTE — Telephone Encounter (Signed)
Insurance has been submitted and verified for Prolia. Patient is responsible for a $250 copay but must have a PA completed for insurance coverage.  Form has been started and given to [facility] for clinical completion and MD signature.

## 2019-05-22 NOTE — Telephone Encounter (Signed)
Pa form faxed to [employer] 05/22/19 1430

## 2019-06-21 ENCOUNTER — Other Ambulatory Visit: Payer: Self-pay | Admitting: Internal Medicine

## 2019-06-21 ENCOUNTER — Other Ambulatory Visit: Payer: Self-pay | Admitting: Family

## 2019-06-21 DIAGNOSIS — I824Z2 Acute embolism and thrombosis of unspecified deep veins of left distal lower extremity: Secondary | ICD-10-CM

## 2019-06-21 MED ORDER — APIXABAN 5 MG PO TABS: 5.0000 mg | ORAL_TABLET | Freq: Two times a day (BID) | ORAL | 2 refills | Status: DC

## 2019-08-01 ENCOUNTER — Ambulatory Visit (INDEPENDENT_AMBULATORY_CARE_PROVIDER_SITE_OTHER): Payer: Medicare Other | Admitting: Family

## 2019-08-01 ENCOUNTER — Other Ambulatory Visit: Payer: Self-pay

## 2019-08-01 DIAGNOSIS — R197 Diarrhea, unspecified: Secondary | ICD-10-CM

## 2019-08-01 NOTE — Progress Notes (Signed)
Stacey Navarro is a 79 y.o. female with the following history as recorded in EpicCare:  Patient Active Problem List   Diagnosis Date Noted  . Lower leg DVT (deep venous thromboembolism), acute, left ([place]) 01/30/2019  . Varicose veins of bilateral lower extremities with other complications 82/50/5397  . Hyperglycemia 03/16/2016  . Obesity (BMI 35.0-39.9 without comorbidity) 03/16/2016  . Carpal tunnel syndrome 02/12/2015  . Breast cancer of upper-inner quadrant of right female breast ([place]) 10/23/2014  . Osteoarthritis of right knee 12/14/2012  . GERD (gastroesophageal reflux disease) 07/17/2011  . Hyperlipidemia with target LDL less than 130 07/17/2011  . Visit for screening mammogram 07/17/2011  . Routine general medical examination at a health care facility 07/17/2011    Current Outpatient Medications  Medication Sig Dispense Refill  . anastrozole (ARIMIDEX) 1 MG tablet Take 1 tablet (1 mg total) by mouth daily. 90 tablet 3  . apixaban (ELIQUIS) 5 MG TABS tablet Take 1 tablet (5 mg total) by mouth 2 (two) times daily. 60 tablet 2  . calcium carbonate (OS-CAL) 1250 (500 CA) MG chewable tablet Chew 1 tablet by mouth daily.    . cholecalciferol (VITAMIN D) 1000 UNITS tablet Take 1,000 Units by mouth daily.    . cycloSPORINE (RESTASIS) 0.05 % ophthalmic emulsion Place 1 drop into both eyes 2 (two) times daily.    . famotidine (PEPCID) 20 MG tablet Take 1 tablet (20 mg total) by mouth 2 (two) times daily. 30 tablet 0  . furosemide (LASIX) 20 MG tablet Take 1 tablet (20 mg total) by mouth daily as needed for fluid or edema. (Patient not taking: Reported on 01/30/2019) 30 tablet 0  . hyaluronate sodium (RADIAPLEXRX) GEL Apply 1 application topically 3 (three) times daily. 170 g 0  . pantoprazole (PROTONIX) 40 MG tablet Take 1 tablet by mouth once daily 90 tablet 1  . potassium chloride SA (K-DUR,KLOR-CON) 20 MEQ tablet Take 1 tablet (20 mEq total) by mouth daily. 30 tablet 0  . rosuvastatin  (CRESTOR) 20 MG tablet Take 1 tablet (20 mg total) by mouth daily. 90 tablet 1  . valACYclovir (VALTREX) 500 MG tablet Take 1 tablet (500 mg total) by mouth 2 (two) times daily. (Patient not taking: Reported on 01/30/2019) 20 tablet 0   No current facility-administered medications for this visit.    Allergies: Xarelto [rivaroxaban]  Past Medical History:  Diagnosis Date  . Arthritis 2010   OA in knees  . Breast cancer of upper-inner quadrant of right female breast ([place]) 10/23/2014  . Carpal tunnel syndrome 02/12/2015   Right  . GERD (gastroesophageal reflux disease)   . Hyperlipidemia   . Personal history of radiation therapy   . Primary localized osteoarthritis of left knee 07/12/2015  . Primary localized osteoarthritis of right knee 05/03/2015  . S/P radiation therapy 12/26/2014 through 01/29/2015    Right breast 4800 cGy in 24 sessions     Past Surgical History:  Procedure Laterality Date  . BREAST LUMPECTOMY Right 11/13/2014  . CHOLECYSTECTOMY  2011  . DILATION AND CURETTAGE OF UTERUS    . PARTIAL KNEE ARTHROPLASTY Right 05/03/2015   Procedure: RIGHT UNICOMPARTMENTAL KNEE;  Surgeon: Marchia Bond, MD;  Location: [place];  Service: Orthopedics;  Laterality: Right;  . PARTIAL KNEE ARTHROPLASTY Left 07/12/2015   Procedure: LEFT UNICOMPARTMENTAL KNEE;  Surgeon: Marchia Bond, MD;  Location: [place];  Service: Orthopedics;  Laterality: Left;  . RADIOACTIVE SEED GUIDED PARTIAL MASTECTOMY WITH AXILLARY SENTINEL LYMPH NODE BIOPSY  Right 11/13/2014   Procedure: RIGHT PARTIAL MASTECTOMY WITH RADIOACTIVE SEED LOCALIZATION RIGHT  AXILLARY SENTINEL LYMPH NODE BIOPSY;  Surgeon: [name], MD;  Location: [place];  Service: General;  Laterality: Right;  . TONSILLECTOMY  1948  . TUBAL LIGATION      Family History  Problem Relation Age of Onset  . Hypertension Mother   .  Alcohol abuse Father   . Breast cancer Maternal Aunt     Social History   Tobacco Use  . Smoking status: Former Smoker    Packs/day: 1.00    Types: Cigarettes    Quit date: 11/06/1995    Years since quitting: 23.7  . Smokeless tobacco: Never Used  Substance Use Topics  . Alcohol use: Yes    Alcohol/week: 2.0 standard drinks    Types: 2 Glasses of wine per week    Comment: occ    Subjective:    I connected with [name] on 08/01/19 at 10:40 AM EST by a video enabled telemedicine application and verified that I am speaking with the correct person using two identifiers. Provider in office/ patient is at home; provider and patient are only 2 people on video call.     I discussed the limitations of evaluation and management by telemedicine and the availability of in person appointments. The patient expressed understanding and agreed to proceed.  Diarrhea x 5 days; no fever or sick contacts; no concern for exposure to COVID; no abdominal pain; decreased appetite; no benefit with Pepto Bismol; has only been drinking water; does not feel it is food borne- daughter lives at home and has no eaten same food/ has no symptoms.      Objective:  There were no vitals filed for this visit.  General: Well developed, well nourished, in no acute distress  Head: Normocephalic and atraumatic  Lungs: Respirations unlabored;  Neurologic: Alert and oriented; speech intact; face symmetrical;   Assessment:  1. Acute diarrhea     Plan:  BRAT diet discussed- need for fluids such as Gatorade; discussed this can be a symptoms of COVID but patient feels any type of exposure unlikely and defers testing at this time; will hold on any prescriptive medication at this time; follow up if symptoms persist.   No follow-ups on file.  No orders of the defined types were placed in this encounter.   Requested Prescriptions    No prescriptions requested or ordered in this encounter

## 2019-08-02 ENCOUNTER — Ambulatory Visit: Payer: Medicare Other | Admitting: Hematology and Oncology

## 2019-08-02 NOTE — Assessment & Plan Note (Deleted)
Rt Lumpectomy 11/14/14: IDC Grade 3, 2.7 cm size, 0/3 LN , Margins clear; T2N0 Stage IIA Right breast invasive ductal carcinoma diagnosed with screening mammogram, 3.1 cm by ultrasound in the upper quadrant 6 cm from the nipple, grade 2-3, EF 100%, PR 48%, HER-2 negative ratio 0.6, Ki-67 was 28%, Oncotype DX score 19, 12% risk of recurrence, status post radiation 4800 cGy in 24 sessions completed 01/29/2015  Treatment plan: antiestrogen therapy with anastrozole 1 mg daily started 02/14/2015, plan for 7 years.  Anastrozole toxicities: Tolerating it very well without any side effects  Breast Cancer Surveillance: 1. Breast exam1/12/2019: Benign 2. Mammograms bilateral: 08/18/2018: No evidence of malignancy in either breast 3.  Mammogram and ultrasound right breast: Postlumpectomy/fat necrosis radiation changes 4. Bone density:  03/10/2019: T score -2.5: Osteoporosis  Osteoporosis: Recommended bisphosphonate therapy along with calcium and vitamin D and weightbearing exercises.  Return to clinic in 1 year for follow-up

## 2019-08-08 ENCOUNTER — Ambulatory Visit: Payer: Self-pay | Admitting: Internal Medicine

## 2019-08-08 NOTE — Telephone Encounter (Signed)
Pt had appt with Jodi Mourning 08/01/2019, diarrhea. States has followed Laura's instructions and still unresolved, was istructed to CB.   Reports 3-4 episodes daily. Denies fever, no abdominal pain, no blood in stools. Denies any nausea/vomitng, no dizziness. States is staying hydrated. No new meds or supplements, no known covid exposure. States "As soon as I eat, comes through me." States stools are at times loose, on occasion watery.  Call transferred for practice for consideration of appt, pt can do virtual. Care advise given, verbalizes understanding.  CB# [phone] Reason for Disposition . [1] MILD diarrhea (e.g., 1-3 or more stools than normal in past 24 hours) without known cause AND [2] present >  7 days  Answer Assessment - Initial Assessment Questions 1. DIARRHEA SEVERITY: "How bad is the diarrhea?" "How many extra stools have you had in the past 24 hours than normal?"    - NO DIARRHEA (SCALE 0)   - MILD (SCALE 1-3): Few loose or mushy BMs; increase of 1-3 stools over normal daily number of stools; mild increase in ostomy output.   -  MODERATE (SCALE 4-7): Increase of 4-6 stools daily over normal; moderate increase in ostomy output. * SEVERE (SCALE 8-10; OR 'WORST POSSIBLE'): Increase of 7 or more stools daily over normal; moderate increase in ostomy output; incontinence.     3-4 times daily 2. ONSET: "When did the diarrhea begin?"      07/28/2019 3. BM CONSISTENCY: "How loose or watery is the diarrhea?"      Loose and watery at times 4. VOMITING: "Are you also vomiting?" If so, ask: "How many times in the past 24 hours?"      no 5. ABDOMINAL PAIN: "Are you having any abdominal pain?" If yes: "What does it feel like?" (e.g., crampy, dull, intermittent, constant)      no 6. ABDOMINAL PAIN SEVERITY: If present, ask: "How bad is the pain?"  (e.g., Scale 1-10; mild, moderate, or severe)   - MILD (1-3): doesn't interfere with normal activities, abdomen soft and not tender to touch    -  MODERATE (4-7): interferes with normal activities or awakens from sleep, tender to touch    - SEVERE (8-10): excruciating pain, doubled over, unable to do any normal activit       *NA 7. ORAL INTAKE: If vomiting, "Have you been able to drink liquids?" "How much fluids have you had in the past 24 hours?"     Staying hydrated 8. HYDRATION: "Any signs of dehydration?" (e.g., dry mouth [not just dry lips], too weak to stand, dizziness, new weight loss) "When did you last urinate?"     none 9. EXOSURE: "Have you traveled to a foreign country recently?" "Have you been exposed to anyone with diarrhea?" "Could you have eaten any food that was spoiled?"     no 10. ANTIBIOTIC USE: "Are you taking antibiotics now or have you taken antibiotics in the past 2 months?"       no 11. OTHER SYMPTOMS: "Do you have any other symptoms?" (e.g., fever, blood in stool)       no  Protocols used: DIARRHEA-A-AH

## 2019-08-08 NOTE — Telephone Encounter (Signed)
F/u    The patient has a virtual visit appt on  08/09/19 @ 10:40am with Jodi Mourning.

## 2019-08-09 ENCOUNTER — Encounter: Payer: Self-pay | Admitting: Family

## 2019-08-09 ENCOUNTER — Ambulatory Visit (INDEPENDENT_AMBULATORY_CARE_PROVIDER_SITE_OTHER): Payer: Medicare Other | Admitting: Family

## 2019-08-09 DIAGNOSIS — R197 Diarrhea, unspecified: Secondary | ICD-10-CM | POA: Diagnosis not present

## 2019-08-09 MED ORDER — METRONIDAZOLE 500 MG PO TABS: 500.0000 mg | ORAL_TABLET | Freq: Three times a day (TID) | ORAL | 0 refills | Status: DC

## 2019-08-09 NOTE — Progress Notes (Signed)
Stacey Navarro is a 79 y.o. female with the following history as recorded in EpicCare:  Patient Active Problem List   Diagnosis Date Noted  . Lower leg DVT (deep venous thromboembolism), acute, left ([place]) 01/30/2019  . Varicose veins of bilateral lower extremities with other complications 63/87/5643  . Hyperglycemia 03/16/2016  . Obesity (BMI 35.0-39.9 without comorbidity) 03/16/2016  . Carpal tunnel syndrome 02/12/2015  . Breast cancer of upper-inner quadrant of right female breast ([place]) 10/23/2014  . Osteoarthritis of right knee 12/14/2012  . GERD (gastroesophageal reflux disease) 07/17/2011  . Hyperlipidemia with target LDL less than 130 07/17/2011  . Visit for screening mammogram 07/17/2011  . Routine general medical examination at a health care facility 07/17/2011    Current Outpatient Medications  Medication Sig Dispense Refill  . anastrozole (ARIMIDEX) 1 MG tablet Take 1 tablet (1 mg total) by mouth daily. 90 tablet 3  . apixaban (ELIQUIS) 5 MG TABS tablet Take 1 tablet (5 mg total) by mouth 2 (two) times daily. 60 tablet 2  . calcium carbonate (OS-CAL) 1250 (500 CA) MG chewable tablet Chew 1 tablet by mouth daily.    . cholecalciferol (VITAMIN D) 1000 UNITS tablet Take 1,000 Units by mouth daily.    . cycloSPORINE (RESTASIS) 0.05 % ophthalmic emulsion Place 1 drop into both eyes 2 (two) times daily.    . famotidine (PEPCID) 20 MG tablet Take 1 tablet (20 mg total) by mouth 2 (two) times daily. 30 tablet 0  . furosemide (LASIX) 20 MG tablet Take 1 tablet (20 mg total) by mouth daily as needed for fluid or edema. (Patient not taking: Reported on 01/30/2019) 30 tablet 0  . hyaluronate sodium (RADIAPLEXRX) GEL Apply 1 application topically 3 (three) times daily. 170 g 0  . metroNIDAZOLE (FLAGYL) 500 MG tablet Take 1 tablet (500 mg total) by mouth 3 (three) times daily. 21 tablet 0  . pantoprazole (PROTONIX) 40 MG tablet Take 1 tablet by mouth once daily 90 tablet 1  . potassium  chloride SA (K-DUR,KLOR-CON) 20 MEQ tablet Take 1 tablet (20 mEq total) by mouth daily. 30 tablet 0  . rosuvastatin (CRESTOR) 20 MG tablet Take 1 tablet (20 mg total) by mouth daily. 90 tablet 1  . valACYclovir (VALTREX) 500 MG tablet Take 1 tablet (500 mg total) by mouth 2 (two) times daily. (Patient not taking: Reported on 01/30/2019) 20 tablet 0   No current facility-administered medications for this visit.    Allergies: Xarelto [rivaroxaban]  Past Medical History:  Diagnosis Date  . Arthritis 2010   OA in knees  . Breast cancer of upper-inner quadrant of right female breast ([place]) 10/23/2014  . Carpal tunnel syndrome 02/12/2015   Right  . GERD (gastroesophageal reflux disease)   . Hyperlipidemia   . Personal history of radiation therapy   . Primary localized osteoarthritis of left knee 07/12/2015  . Primary localized osteoarthritis of right knee 05/03/2015  . S/P radiation therapy 12/26/2014 through 01/29/2015    Right breast 4800 cGy in 24 sessions     Past Surgical History:  Procedure Laterality Date  . BREAST LUMPECTOMY Right 11/13/2014  . CHOLECYSTECTOMY  2011  . DILATION AND CURETTAGE OF UTERUS    . PARTIAL KNEE ARTHROPLASTY Right 05/03/2015   Procedure: RIGHT UNICOMPARTMENTAL KNEE;  Surgeon: Marchia Bond, MD;  Location: [facility];  Service: Orthopedics;  Laterality: Right;  . PARTIAL KNEE ARTHROPLASTY Left 07/12/2015   Procedure: LEFT UNICOMPARTMENTAL KNEE;  Surgeon: Marchia Bond, MD;  Location: [facility];  Service: Orthopedics;  Laterality: Left;  . RADIOACTIVE SEED GUIDED PARTIAL MASTECTOMY WITH AXILLARY SENTINEL LYMPH NODE BIOPSY Right 11/13/2014   Procedure: RIGHT PARTIAL MASTECTOMY WITH RADIOACTIVE SEED LOCALIZATION RIGHT  AXILLARY SENTINEL LYMPH NODE BIOPSY;  Surgeon: [name], MD;  Location: [facility];  Service: General;  Laterality: Right;  .  TONSILLECTOMY  1948  . TUBAL LIGATION      Family History  Problem Relation Age of Onset  . Hypertension Mother   . Alcohol abuse Father   . Breast cancer Maternal Aunt     Social History   Tobacco Use  . Smoking status: Former Smoker    Packs/day: 1.00    Types: Cigarettes    Quit date: 11/06/1995    Years since quitting: 23.7  . Smokeless tobacco: Never Used  Substance Use Topics  . Alcohol use: Yes    Alcohol/week: 2.0 standard drinks    Types: 2 Glasses of wine per week    Comment: occ    Subjective:   I connected with [name] on 08/09/19 at 10:40 AM EST by a video enabled telemedicine application and verified that I am speaking with the correct person using two identifiers. Provider in office/ patient is at home; provider and patient are only 2 people on video call.     I discussed the limitations of evaluation and management by telemedicine and the availability of in person appointments. The patient expressed understanding and agreed to proceed.  Patient had a virtual visit on 08/01/2019 with concerns for acute diarrhea; At that time, the symptoms had been present for the past 5 days; she notes that her symptoms did improve slightly with BRAT diet/ fluids initially but have re-flared in the past few days; denies any fever or blood in stool; no abdominal pain; again denies any concerns for COVID exposure and patient defers testing at this time; lives with her daughter and daughter has no symptoms.   Objective:  There were no vitals filed for this visit.  General: Well developed, well nourished, in no acute distress  Head: Normocephalic and atraumatic  Lungs: Respirations unlabored;  Neurologic: Alert and oriented; speech intact; face symmetrical;   Assessment:  1. Diarrhea, unspecified type   2. Acute diarrhea     Plan:  Due to COVID restrictions, I am not allowed to bring the patient into the office for in person exam and/ or labs; will try treating with  Flagyl x 7 days and patient's daughter will come pick up kit for stool studies; if symptoms persist, patient will need to get COVID testing and be seen in person at [facility].  Follow-up to be determined.   No follow-ups on file.  Orders Placed This Encounter  Procedures  . Gastrointestinal Panel by PCR , Stool    Standing Status:   Future    Standing Expiration Date:   08/08/2020  . C. difficile GDH and Toxin A/B    Standing Status:   Future    Standing Expiration Date:   08/08/2020  . Gastrointestinal Pathogen Panel PCR    Standing Status:   Future    Standing Expiration Date:   08/08/2020    Requested Prescriptions   Signed Prescriptions Disp Refills  . metroNIDAZOLE (FLAGYL) 500 MG tablet 21 tablet 0    Sig: Take 1 tablet (500 mg total) by mouth 3 (three) times daily.

## 2019-08-10 ENCOUNTER — Telehealth: Payer: Self-pay

## 2019-08-10 ENCOUNTER — Ambulatory Visit: Payer: Self-pay

## 2019-08-10 NOTE — Telephone Encounter (Signed)
Patient called stating that she has blood in her stool today.  She states that she had a virtual visit with Jodi Mourning yesterday and was placed on flagyl.  She was told if she finds blood in her diarrhea she was to call back. Patient states she has very loose stools for about 2 weeks.  Today she has had only one stool but when she got done the water was red.  She states that she is drinking and voiding normally She has no vomiting.Care advice read to patient.  Call transferred to office for scheduling call.   Reason for Disposition . [1] Blood in the stool AND [2] small amount of blood   (Exception: only on toilet paper. Reason: diarrhea can cause rectal irritation with blood on wiping)  Answer Assessment - Initial Assessment Questions 1. DIARRHEA SEVERITY: "How bad is the diarrhea?" "How many extra stools have you had in the past 24 hours than normal?"    - NO DIARRHEA (SCALE 0)   - MILD (SCALE 1-3): Few loose or mushy BMs; increase of 1-3 stools over normal daily number of stools; mild increase in ostomy output.   -  MODERATE (SCALE 4-7): Increase of 4-6 stools daily over normal; moderate increase in ostomy output. * SEVERE (SCALE 8-10; OR 'WORST POSSIBLE'): Increase of 7 or more stools daily over normal; moderate increase in ostomy output; incontinence.     1 2. ONSET: "When did the diarrhea begin?"     Several weeks 3. BM CONSISTENCY: "How loose or watery is the diarrhea?"     loose 4. VOMITING: "Are you also vomiting?" If so, ask: "How many times in the past 24 hours?"      no 5. ABDOMINAL PAIN: "Are you having any abdominal pain?" If yes: "What does it feel like?" (e.g., crampy, dull, intermittent, constant)      no 6. ABDOMINAL PAIN SEVERITY: If present, ask: "How bad is the pain?"  (e.g., Scale 1-10; mild, moderate, or severe)   - MILD (1-3): doesn't interfere with normal activities, abdomen soft and not tender to touch    - MODERATE (4-7): interferes with normal activities or awakens  from sleep, tender to touch    - SEVERE (8-10): excruciating pain, doubled over, unable to do any normal activities     none 7. ORAL INTAKE: If vomiting, "Have you been able to drink liquids?" "How much fluids have you had in the past 24 hours?"    No problem 8. HYDRATION: "Any signs of dehydration?" (e.g., dry mouth [not just dry lips], too weak to stand, dizziness, new weight loss) "When did you last urinate?"    no 9. EXPOSURE: "Have you traveled to a foreign country recently?" "Have you been exposed to anyone with diarrhea?" "Could you have eaten any food that was spoiled?"     no 10. ANTIBIOTIC USE: "Are you taking antibiotics now or have you taken antibiotics in the past 2 months?"       flagyl 11. OTHER SYMPTOMS: "Do you have any other symptoms?" (e.g., fever, blood in stool)      Blood today is first 12. PREGNANCY: "Is there any chance you are pregnant?" "When was your last menstrual period?"      [name]  Protocols used: DIARRHEA-A-AH

## 2019-08-10 NOTE — Telephone Encounter (Signed)
Can we schedule pt with the Resp clinic please?

## 2019-08-10 NOTE — Telephone Encounter (Signed)
Called and left message for patient to return call to clinic.

## 2019-08-10 NOTE — Telephone Encounter (Signed)
Patient has more questions for Mickel Baas. Thank you

## 2019-08-10 NOTE — Telephone Encounter (Signed)
Pt PCP Dr. Ronnald Ramp. Pt saw Jodi Mourning yesterday in a virtual visit for diarrhea. Pt called to report some blood in stool today. Forward to triage.

## 2019-08-10 NOTE — Telephone Encounter (Signed)
Pt called back today asking to speak to Mickel Baas.  She has more questions.  CB# [phone]

## 2019-08-10 NOTE — Telephone Encounter (Signed)
Called patient and attempted to schedule at respiratory clinic. Patient refused making an appointment at resp clinic. States that she doesn't think that it is emergent, that she has a history of pollips and thinks it may be a pollip that is bleeding or that the skin inside is irritated. Would like to know what Mickel Baas would like for her to do, not Dr Ronnald Ramp, since she did not have the visit with Ronnald Ramp.

## 2019-08-11 NOTE — Telephone Encounter (Signed)
Called and spoke with patient today. She will try and find an urgent care today under [facility] so they would have access to her medical history.

## 2019-08-11 NOTE — Telephone Encounter (Signed)
She needs to be seen in person somewhere. We had discussed that at her last virtual visit. The blood could simply be due to the persisting diarrhea but she needs an in person exam. Maybe she would prefer to go to [facility]?

## 2019-08-30 ENCOUNTER — Telehealth: Payer: Self-pay

## 2019-08-30 NOTE — Telephone Encounter (Signed)
1) Is pre treatment or current calcium levels available?   Yes, calcium is 10.1 completed on 01/30/2019 If so, is patient hypocalcemic? No If so will calcium levels be corrected before using requested medication?  If NO, is the patient at risk for hypocalcemia? No  2) Is patient receiving any of the drugs concomitant forteo, tymlos, exgeza, bisphosphonate, or SERM therapy within the past 90 days? No If so, will drugs be discontinued before intiating therapy of requested agent.   3) Is patient pregnant? No  4) Is the dose requested within FDA labeling?  Yes

## 2019-08-30 NOTE — Telephone Encounter (Signed)
Scott with [facility] calling in regards to the PA on Prolia. Below is a series of questions that the pharmacist is needing additional answers to.  1) Is pre treatment or current calcium levels available? If so, is patient hypocalcemic? If so will calcium levels be corrected before using requested medication? If NO, is the patient at risk for hypocalcemia?  2) Is patient receiving any of the drugs concomitant forteo, tymlos, exgeza, bisphosphonate, or SERM therapy within the past 90 days? If so, will drugs be discontinued before intiating therapy of requested agent. 3) Is patient pregnant? 4) Is the dose requested within FDA labeling?  When calling back please provide patient's name, DOB, and reference to this request. PA for Prolia 60MG solution

## 2019-08-30 NOTE — Telephone Encounter (Signed)
Yes please

## 2019-08-31 NOTE — Telephone Encounter (Signed)
This has been sent by fax.

## 2019-09-02 ENCOUNTER — Ambulatory Visit: Payer: Medicare Other | Attending: Internal Medicine

## 2019-09-02 DIAGNOSIS — Z23 Encounter for immunization: Secondary | ICD-10-CM | POA: Insufficient documentation

## 2019-09-02 NOTE — Progress Notes (Signed)
   Covid-19 Vaccination Clinic  Name:  RIFKY LAPRE    MRN: [id number] DOB: 1941/05/23  09/02/2019  Ms. Creek was observed post Covid-19 immunization for 15 minutes without incidence. She was provided with Vaccine Information Sheet and instruction to access the V-Safe system.   Ms. Duet was instructed to call 911 with any severe reactions post vaccine: Marland Kitchen Difficulty breathing  . Swelling of your face and throat  . A fast heartbeat  . A bad rash all over your body  . Dizziness and weakness    Immunizations Administered    Name Date Dose VIS Date Route   Pfizer COVID-19 Vaccine 09/02/2019  5:24 PM 0.3 mL 07/07/2019 Intramuscular   Manufacturer: [place]   Lot: [id number]   [place]: [id number]

## 2019-09-20 ENCOUNTER — Ambulatory Visit: Payer: Medicare Other

## 2019-09-26 ENCOUNTER — Telehealth: Payer: Self-pay | Admitting: Internal Medicine

## 2019-09-26 NOTE — Chronic Care Management (AMB) (Signed)
  Chronic Care Management   Note  09/26/2019 Name: Stacey Navarro MRN: [id number] DOB: [birth year]  Stacey Navarro is a 79 y.o. year old female who is a primary care patient of [name], MD. I reached out to [name] by phone today in response to a referral sent by Ms. Lanney Gins PCP, [name], MD.   Ms. Evora was given information about Chronic Care Management services today including:  1. CCM service includes personalized support from designated clinical staff supervised by her physician, including individualized plan of care and coordination with other care providers 2. 24/7 contact phone numbers for assistance for urgent and routine care needs. 3. Service will only be billed when office clinical staff spend 20 minutes or more in a month to coordinate care. 4. Only one practitioner may furnish and bill the service in a calendar month. 5. The patient may stop CCM services at any time (effective at the end of the month) by phone call to the office staff. 6. The patient will be responsible for cost sharing (co-pay) of up to 20% of the service fee (after annual deductible is met).  Patient agreed to services and verbal consent obtained.   Follow up plan:   Raynicia Dukes [employer] Scheduler

## 2019-09-27 ENCOUNTER — Ambulatory Visit: Payer: Medicare Other | Attending: Internal Medicine

## 2019-09-27 DIAGNOSIS — Z23 Encounter for immunization: Secondary | ICD-10-CM | POA: Insufficient documentation

## 2019-09-27 NOTE — Progress Notes (Signed)
   Covid-19 Vaccination Clinic  Name:  [name]    MRN: [id number] DOB: 1941/03/20  09/27/2019  Ms. Glaza was observed post Covid-19 immunization for 15 minutes without incident. She was provided with Vaccine Information Sheet and instruction to access the V-Safe system.   Ms. Alred was instructed to call 911 with any severe reactions post vaccine: Marland Kitchen Difficulty breathing  . Swelling of face and throat  . A fast heartbeat  . A bad rash all over body  . Dizziness and weakness   Immunizations Administered    Name Date Dose VIS Date Route   Pfizer COVID-19 Vaccine 09/27/2019  2:30 PM 0.3 mL 07/07/2019 Intramuscular   Manufacturer: [place]   Lot: [id number]   [place]: [place] COVID-19 Vaccine 09/27/2019  2:30 PM 0.3 mL 07/07/2019 Intramuscular   Manufacturer: [place]   Lot: [id number]   [place]: [address]-0      Covid-19 Vaccination Clinic  Name:  [name]    MRN: [id number] DOB: [birth year]  09/27/2019  Ms. Stenerson was observed post Covid-19 immunization for 15 minutes without incident. She was provided with Vaccine Information Sheet and instruction to access the V-Safe system.   Ms. Hodzic was instructed to call 911 with any severe reactions post vaccine: Marland Kitchen Difficulty breathing  . Swelling of face and throat  . A fast heartbeat  . A bad rash all over body  . Dizziness and weakness

## 2019-10-02 ENCOUNTER — Other Ambulatory Visit: Payer: Self-pay | Admitting: Internal Medicine

## 2019-10-02 DIAGNOSIS — I824Z2 Acute embolism and thrombosis of unspecified deep veins of left distal lower extremity: Secondary | ICD-10-CM

## 2019-10-04 NOTE — Telephone Encounter (Signed)
Error

## 2019-10-10 ENCOUNTER — Other Ambulatory Visit: Payer: Self-pay

## 2019-10-10 ENCOUNTER — Ambulatory Visit: Payer: Medicare Other | Admitting: Pharmacist

## 2019-10-10 DIAGNOSIS — K2100 Gastro-esophageal reflux disease with esophagitis, without bleeding: Secondary | ICD-10-CM

## 2019-10-10 DIAGNOSIS — E785 Hyperlipidemia, unspecified: Secondary | ICD-10-CM

## 2019-10-10 DIAGNOSIS — I824Z2 Acute embolism and thrombosis of unspecified deep veins of left distal lower extremity: Secondary | ICD-10-CM

## 2019-10-10 NOTE — Chronic Care Management (AMB) (Signed)
Chronic Care Management Pharmacy  Name: Stacey Navarro  MRN: [id number] DOB: 1941/03/04   Chief Complaint/ HPI  [name],  79 y.o. , female presents for their Initial CCM visit with the clinical pharmacist via telephone due to COVID-19 Pandemic.  PCP : [name], MD  Their chronic conditions include: HLD, GERD, hx DVT, Hx breast cancer  From [place], [place], [place], [place], daughter in town.  Biggest issues - Eliquis and weight.   Office Visits: 08/09/19 NP Jasmine December VV: diarrhea f/u, started flagyl x  Days. 08/01/19 NP Jasmine December VV: c/o diarrhea, rec'd BRAT, no meds 02/28/19 NP Jasmine December VV: c/o rash after starting Xarelto and Crestor. Stopped both meds, switched to Eliquis, plan to re-introduce Crestor after a week or so.  01/30/19 Dr Ronnald Ramp:  Elevated ASCVD risk so started rosuvastatin 20 mg. [place] positive for DVT, started Xarelto.  Consult Visit: 12/02/18 Dr Lindi Adie (heme/onc): tolerating anastrozole, no plans to stop, RTC 1 year.  Medications: Outpatient Encounter Medications as of 10/10/2019  Medication Sig  . anastrozole (ARIMIDEX) 1 MG tablet Take 1 tablet (1 mg total) by mouth daily.  . calcium carbonate (OS-CAL) 1250 (500 CA) MG chewable tablet Chew 1 tablet by mouth daily.  . cholecalciferol (VITAMIN D) 1000 UNITS tablet Take 1,000 Units by mouth daily.  Marland Kitchen ELIQUIS 5 MG TABS tablet Take 1 tablet by mouth twice daily  . Omega-3 Fatty Acids (FISH OIL) 1000 MG CAPS Take 1 capsule by mouth daily.  . pantoprazole (PROTONIX) 40 MG tablet Take 1 tablet by mouth once daily (Patient taking differently: 40 mg daily as needed. )  . potassium chloride SA (K-DUR,KLOR-CON) 20 MEQ tablet Take 1 tablet (20 mEq total) by mouth daily. (Patient taking differently: Take 20 mEq by mouth daily as needed (cramping). )  . Probiotic Product (PROBIOTIC-10 PO) Take 1 capsule by mouth daily.  . vitamin E 1000 UNIT capsule Take 1,000 Units by mouth daily.  . cycloSPORINE (RESTASIS) 0.05 %  ophthalmic emulsion Place 1 drop into both eyes 2 (two) times daily.  . famotidine (PEPCID) 20 MG tablet Take 1 tablet (20 mg total) by mouth 2 (two) times daily. (Patient not taking: Reported on 10/10/2019)  . furosemide (LASIX) 20 MG tablet Take 1 tablet (20 mg total) by mouth daily as needed for fluid or edema. (Patient not taking: Reported on 01/30/2019)  . hyaluronate sodium (RADIAPLEXRX) GEL Apply 1 application topically 3 (three) times daily.  . metroNIDAZOLE (FLAGYL) 500 MG tablet Take 1 tablet (500 mg total) by mouth 3 (three) times daily.  . rosuvastatin (CRESTOR) 20 MG tablet Take 1 tablet (20 mg total) by mouth daily. (Patient not taking: Reported on 10/10/2019)  . valACYclovir (VALTREX) 500 MG tablet Take 1 tablet (500 mg total) by mouth 2 (two) times daily. (Patient not taking: Reported on 01/30/2019)   No facility-administered encounter medications on file as of 10/10/2019.     Current Diagnosis/Assessment:  Goals Addressed            This Visit's Progress   . Pharmacy Care Plan       CARE PLAN ENTRY  Current Barriers:  . Chronic Disease Management support, education, and care coordination needs related to HTN and HLD, GERD  Pharmacist Clinical Goal(s):  Marland Kitchen Maintain LDL < 130 . Maintain BP < 130/80 . Ensure safety, efficacy, and affordability of medications  Interventions: . Comprehensive medication review performed. . Discussed benefits of statin if cholesterol does not improve . Discussed length of  therapy for Eliquis after DVT - continue to discuss with PCP . May pursue patient assistance for Eliquis once donut hole is reached  Patient Self Care Activities:  . Self administers medications as prescribed, Calls pharmacy for medication refills, and Calls provider office for new concerns or questions  Initial goal documentation        DVT   DVT on [place] July 2020.   Lab Results  Component Value Date   CREATININE 0.99 01/30/2019   CREATININE 0.95 07/21/2017    CREATININE 1.01 03/16/2016   Patient has failed these meds in past: Xarelto (rash) Patient is currently controlled on the following medications: Eliquis 5 mg BID  We discussed:  Provoked vs  Unprovoked DVT and length of therapy, pt would like to get off Eliquis if she can but willing to continue to discuss with PCP.  Pt does report being more sedentary last summer prior to DVT but no long trips or prolonged stillness.  Plan  Continue current medications    Hyperlipidemia   Lipid Panel     Component Value Date/Time   CHOL 237 (H) 01/30/2019 1129   TRIG 98.0 01/30/2019 1129   HDL 58.20 01/30/2019 1129   CHOLHDL 4 01/30/2019 1129   VLDL 19.6 01/30/2019 1129   LDLCALC 159 (H) 01/30/2019 1129    ASCVD 10-year risk: 20.9%   Patient has failed these meds in past: n/a Patient is currently uncontrolled on the following medications: no meds  We discussed:  diet and exercise extensively; pt is not taking statin as prescribed, she has begun lifestyle changes and having some success. Cut down on portions, exercise, eating decent food and reports weight loss. Discussed benefits of statin outside of just lowering cholesterol including prevention of CV disease. Pt agrees if lipids remain elevated at next check she would benefit from statin.  Plan  Continue control with diet and exercise    Hx Breast Cancer   Patient has failed these meds in past: n/a Patient is currently controlled on the following medications: anastrozole 1 mg daily (started 2016)  We discussed:  Pt reports she is in her last year of anastrozole therapy, planning to f/u with oncologist this summer.  Plan  Continue current medications   GERD   Patient has failed these meds in past: n/a Patient is currently controlled on the following medications: pantoprazole 40 mg PRN  We discussed:  Since improvements in diet and weight loss, pt has not had issues with reflux. Stopped taking famotidine and only takes  pantoprazole PRN.   Plan  Continue current medications   Health Maintenance   Patient is currently controlled on the following medications: calcium carbonate 500 mg BID, Vitamin D 1000 IU daily, Vitamin E, fish oil, probiotics, potassium 20 meq PRN cramping  We discussed:  Pt is satisfied with OTC regimen  Plan  Continue current medications    Medication Management   Pt uses [facility] for all medications Does not use pill box - uses special container for camping in the summer Pt endorses 100% compliance  We discussed:  Pt has system in place that works for her, never really misses doses of meds. [facility] is one of few pharmacies that takes her insurance.  Plan  Continue current med management strategy    Follow up: 6 month phone visit  Charlene Brooke, PharmD Clinical Pharmacist [facility] at [facility] [phone]

## 2019-10-10 NOTE — Progress Notes (Signed)
  I have reviewed this encounter including the documentation in this note and/or discussed this patient with the care management provider. I am certifying that I agree with the content of this note as supervising physician.  [name], MD  10/10/2019

## 2019-10-10 NOTE — Patient Instructions (Addendum)
Visit Information  Thank you for meeting with me to discuss your medications! I look forward to working with you to achieve your health care goals. Below is a summary of what we talked about during the visit:  Goals Addressed            This Visit's Progress   . Pharmacy Care Plan       CARE PLAN ENTRY  Current Barriers:  . Chronic Disease Management support, education, and care coordination needs related to HTN and HLD, GERD  Pharmacist Clinical Goal(s):  Marland Kitchen Maintain LDL < 130 . Maintain BP < 130/80 . Ensure safety, efficacy, and affordability of medications  Interventions: . Comprehensive medication review performed. . Discussed benefits of statin if cholesterol does not improve . Discussed length of therapy for Eliquis after DVT - continue to discuss with PCP . May pursue patient assistance for Eliquis once donut hole is reached  Patient Self Care Activities:  . Self administers medications as prescribed, Calls pharmacy for medication refills, and Calls provider office for new concerns or questions  Initial goal documentation        Stacey Navarro was given information about Chronic Care Management services today including:  1. CCM service includes personalized support from designated clinical staff supervised by her physician, including individualized plan of care and coordination with other care providers 2. 24/7 contact phone numbers for assistance for urgent and routine care needs. 3. Standard insurance, coinsurance, copays and deductibles apply for chronic care management only during months in which we provide at least 20 minutes of these services. Most insurances cover these services at 100%, however patients may be responsible for any copay, coinsurance and/or deductible if applicable. This service may help you avoid the need for more expensive face-to-face services. 4. Only one practitioner may furnish and bill the service in a calendar month. 5. The patient may stop CCM  services at any time (effective at the end of the month) by phone call to the office staff.  Patient agreed to services and verbal consent obtained.   The patient verbalized understanding of instructions provided today and declined a print copy of patient instruction materials.  Telephone follow up appointment with pharmacy team member scheduled for: 04/15/20 @ 1:00 pm  Charlene Brooke, PharmD Clinical Pharmacist [facility] at [facility] [phone]   Heart-Healthy Eating Plan Heart-healthy meal planning includes:  Eating less unhealthy fats.  Eating more healthy fats.  Making other changes in your diet. Talk with your doctor or a diet specialist (dietitian) to create an eating plan that is right for you. What are tips for following this plan? Cooking Avoid frying your food. Try to bake, boil, grill, or broil it instead. You can also reduce fat by:  Removing the skin from poultry.  Removing all visible fats from meats.  Steaming vegetables in water or broth. Meal planning   At meals, divide your plate into four equal parts: ? Fill one-half of your plate with vegetables and green salads. ? Fill one-fourth of your plate with whole grains. ? Fill one-fourth of your plate with lean protein foods.  Eat 4-5 servings of vegetables per day. A serving of vegetables is: ? 1 cup of raw or cooked vegetables. ? 2 cups of raw leafy greens.  Eat 4-5 servings of fruit per day. A serving of fruit is: ? 1 medium whole fruit. ?  cup of dried fruit. ?  cup of fresh, frozen, or canned fruit. ?  cup of 100%  fruit juice.  Eat more foods that have soluble fiber. These are apples, broccoli, carrots, beans, peas, and barley. Try to get 20-30 g of fiber per day.  Eat 4-5 servings of nuts, legumes, and seeds per week: ? 1 serving of dried beans or legumes equals  cup after being cooked. ? 1 serving of nuts is  cup. ? 1 serving of seeds equals 1 tablespoon. General  information  Eat more home-cooked food. Eat less restaurant, buffet, and fast food.  Limit or avoid alcohol.  Limit foods that are high in starch and sugar.  Avoid fried foods.  Lose weight if you are overweight.  Keep track of how much salt (sodium) you eat. This is important if you have high blood pressure. Ask your doctor to tell you more about this.  Try to add vegetarian meals each week. Fats  Choose healthy fats. These include olive oil and canola oil, flaxseeds, walnuts, almonds, and seeds.  Eat more omega-3 fats. These include salmon, mackerel, sardines, tuna, flaxseed oil, and ground flaxseeds. Try to eat fish at least 2 times each week.  Check food labels. Avoid foods with trans fats or high amounts of saturated fat.  Limit saturated fats. ? These are often found in animal products, such as meats, butter, and cream. ? These are also found in plant foods, such as palm oil, palm kernel oil, and coconut oil.  Avoid foods with partially hydrogenated oils in them. These have trans fats. Examples are stick margarine, some tub margarines, cookies, crackers, and other baked goods. What foods can I eat? Fruits All fresh, canned (in natural juice), or frozen fruits. Vegetables Fresh or frozen vegetables (raw, steamed, roasted, or grilled). Green salads. Grains Most grains. Choose whole wheat and whole grains most of the time. Rice and pasta, including brown rice and pastas made with whole wheat. Meats and other proteins Lean, well-trimmed beef, veal, pork, and lamb. Chicken and [place] without skin. All fish and shellfish. Wild duck, rabbit, pheasant, and venison. Egg whites or low-cholesterol egg substitutes. Dried beans, peas, lentils, and tofu. Seeds and most nuts. Dairy Low-fat or nonfat cheeses, including ricotta and mozzarella. Skim or 1% milk that is liquid, powdered, or evaporated. Buttermilk that is made with low-fat milk. Nonfat or low-fat yogurt. Fats and  oils Non-hydrogenated (trans-free) margarines. Vegetable oils, including soybean, sesame, sunflower, olive, peanut, safflower, corn, canola, and cottonseed. Salad dressings or mayonnaise made with a vegetable oil. Beverages Mineral water. Coffee and tea. Diet carbonated beverages. Sweets and desserts Sherbet, gelatin, and fruit ice. Small amounts of dark chocolate. Limit all sweets and desserts. Seasonings and condiments All seasonings and condiments. The items listed above may not be a complete list of foods and drinks you can eat. Contact a dietitian for more options. What foods should I avoid? Fruits Canned fruit in heavy syrup. Fruit in cream or butter sauce. Fried fruit. Limit coconut. Vegetables Vegetables cooked in cheese, cream, or butter sauce. Fried vegetables. Grains Breads that are made with saturated or trans fats, oils, or whole milk. Croissants. Sweet rolls. Donuts. High-fat crackers, such as cheese crackers. Meats and other proteins Fatty meats, such as hot dogs, ribs, sausage, bacon, rib-eye roast or steak. High-fat deli meats, such as salami and bologna. Caviar. Domestic duck and goose. Organ meats, such as liver. Dairy Cream, sour cream, cream cheese, and creamed cottage cheese. Whole-milk cheeses. Whole or 2% milk that is liquid, evaporated, or condensed. Whole buttermilk. Cream sauce or high-fat cheese sauce. Yogurt that is  made from whole milk. Fats and oils Meat fat, or shortening. Cocoa butter, hydrogenated oils, palm oil, coconut oil, palm kernel oil. Solid fats and shortenings, including bacon fat, salt pork, lard, and butter. Nondairy cream substitutes. Salad dressings with cheese or sour cream. Beverages Regular sodas and juice drinks with added sugar. Sweets and desserts Frosting. Pudding. Cookies. Cakes. Pies. Milk chocolate or white chocolate. Buttered syrups. Full-fat ice cream or ice cream drinks. The items listed above may not be a complete list of foods  and drinks to avoid. Contact a dietitian for more information. Summary  Heart-healthy meal planning includes eating less unhealthy fats, eating more healthy fats, and making other changes in your diet.  Eat a balanced diet. This includes fruits and vegetables, low-fat or nonfat dairy, lean protein, nuts and legumes, whole grains, and heart-healthy oils and fats. This information is not intended to replace advice given to you by your health care provider. Make sure you discuss any questions you have with your health care provider. Document Revised: 09/16/2017 Document Reviewed: 08/20/2017 Elsevier Patient Education  2020 [employer].

## 2019-10-14 NOTE — Addendum Note (Signed)
Addended by: Aviva Signs M on: 10/14/2019 11:59 AM   Modules accepted: Orders

## 2019-10-28 ENCOUNTER — Other Ambulatory Visit: Payer: Self-pay | Admitting: Hematology and Oncology

## 2019-10-28 DIAGNOSIS — C50211 Malignant neoplasm of upper-inner quadrant of right female breast: Secondary | ICD-10-CM

## 2019-11-04 ENCOUNTER — Other Ambulatory Visit: Payer: Self-pay | Admitting: Internal Medicine

## 2019-11-04 DIAGNOSIS — I824Z2 Acute embolism and thrombosis of unspecified deep veins of left distal lower extremity: Secondary | ICD-10-CM

## 2019-11-06 ENCOUNTER — Telehealth: Payer: Self-pay

## 2019-11-06 DIAGNOSIS — I824Z2 Acute embolism and thrombosis of unspecified deep veins of left distal lower extremity: Secondary | ICD-10-CM

## 2019-11-06 NOTE — Telephone Encounter (Signed)
New message:   Pt is calling to see if the Dr still wants her to take the Eliquis. She is assuming because it has not been sent to the pharmacy that he no longer wants her to take this medication. I have let the pt know that it can take up to 3 days for this refill to happen.  She states she is all the way out of this medication. Please advise.

## 2019-11-06 NOTE — Telephone Encounter (Signed)
New message    Patient wants to know should she continue taken eliquis if so out of medication now.   1.Medication Requested:ELIQUIS 5 MG TABS tablet  2. Pharmacy (Name, Street, [place]):[facility]  3. On Med List: Yes   4. Last Visit with PCP: 1.13.21   5. Next visit date with PCP: no appt is made at this time    Agent: Please be advised that RX refills may take up to 3 business days. We ask that you follow-up with your pharmacy.

## 2019-11-06 NOTE — Telephone Encounter (Signed)
Per PCP - pt will need an appointment to evaluate the need for the eliquis. There is a complicated clinical decision - not able to access over the phone.

## 2019-11-07 ENCOUNTER — Other Ambulatory Visit: Payer: Self-pay | Admitting: Internal Medicine

## 2019-11-07 DIAGNOSIS — I824Z2 Acute embolism and thrombosis of unspecified deep veins of left distal lower extremity: Secondary | ICD-10-CM

## 2019-11-07 NOTE — Telephone Encounter (Signed)
Pt contacted and has been scheduled for an appt tomorrow at 1:20pm

## 2019-11-08 ENCOUNTER — Other Ambulatory Visit: Payer: Self-pay

## 2019-11-08 ENCOUNTER — Encounter: Payer: Self-pay | Admitting: Internal Medicine

## 2019-11-08 ENCOUNTER — Ambulatory Visit (INDEPENDENT_AMBULATORY_CARE_PROVIDER_SITE_OTHER): Payer: Medicare Other | Admitting: Internal Medicine

## 2019-11-08 VITALS — BP 138/70 | HR 65 | Temp 98.1°F | Ht 66.0 in | Wt 200.0 lb

## 2019-11-08 DIAGNOSIS — I82562 Chronic embolism and thrombosis of left calf muscular vein: Secondary | ICD-10-CM | POA: Diagnosis not present

## 2019-11-08 DIAGNOSIS — D6859 Other primary thrombophilia: Secondary | ICD-10-CM

## 2019-11-08 DIAGNOSIS — R739 Hyperglycemia, unspecified: Secondary | ICD-10-CM | POA: Diagnosis not present

## 2019-11-08 LAB — BASIC METABOLIC PANEL
BUN: 18 mg/dL (ref 6–23)
CO2: 28 mEq/L (ref 19–32)
Calcium: 9.7 mg/dL (ref 8.4–10.5)
Chloride: 104 mEq/L (ref 96–112)
Creatinine, Ser: 0.8 mg/dL (ref 0.40–1.20)
GFR: 69.54 mL/min (ref >60.00)
Glucose, Bld: 111 mg/dL — ABNORMAL HIGH (ref 70–99)
Potassium: 4.4 mEq/L (ref 3.5–5.1)
Sodium: 139 mEq/L (ref 135–145)

## 2019-11-08 LAB — CBC WITH DIFFERENTIAL/PLATELET
Basophils Absolute: 0 10*3/uL (ref 0.0–0.1)
Basophils Relative: 0.7 % (ref 0.0–3.0)
Eosinophils Absolute: 0.2 10*3/uL (ref 0.0–0.7)
Eosinophils Relative: 4.2 % (ref 0.0–5.0)
HCT: 49.8 % — ABNORMAL HIGH (ref 36.0–46.0)
Hemoglobin: 16.6 g/dL — ABNORMAL HIGH (ref 12.0–15.0)
Lymphocytes Relative: 31.6 % (ref 12.0–46.0)
Lymphs Abs: 1.7 10*3/uL (ref 0.7–4.0)
MCHC: 33.2 g/dL (ref 30.0–36.0)
MCV: 100.1 fl — ABNORMAL HIGH (ref 78.0–100.0)
Monocytes Absolute: 0.6 10*3/uL (ref 0.1–1.0)
Monocytes Relative: 11.5 % (ref 3.0–12.0)
Neutro Abs: 2.9 10*3/uL (ref 1.4–7.7)
Neutrophils Relative %: 52 % (ref 43.0–77.0)
Platelets: 243 10*3/uL (ref 150.0–400.0)
RBC: 4.98 Mil/uL (ref 3.87–5.11)
RDW: 17.4 % — ABNORMAL HIGH (ref 11.5–15.5)
WBC: 5.5 10*3/uL (ref 4.0–10.5)

## 2019-11-08 LAB — D-DIMER, QUANTITATIVE: D-Dimer, Quant: 0.86 mcg/mL FEU — ABNORMAL HIGH (ref <0.50)

## 2019-11-08 MED ORDER — APIXABAN 2.5 MG PO TABS: 2.5000 mg | ORAL_TABLET | Freq: Two times a day (BID) | ORAL | 1 refills | Status: DC

## 2019-11-08 NOTE — Progress Notes (Signed)
Subjective:  Patient ID: Stacey Navarro, female    DOB: [birth year]  Age: 79 y.o. MRN: [id number]  CC: Follow-up  This visit occurred during the SARS-CoV-2 public health emergency.  Safety protocols were in place, including screening questions prior to the visit, additional usage of staff PPE, and extensive cleaning of exam room while observing appropriate contact time as indicated for disinfecting solutions.    HPI Stacey Navarro presents for f/up - She had a non-provoked DVT about 9 months ago and has done well on Eliquis. She has a hx of breast cancer, is on arimidex and she would like to continue taking an anticoagulant to reduce the risk of another DVT. She has decided not to take a statin.  Outpatient Medications Prior to Visit  Medication Sig Dispense Refill  . anastrozole (ARIMIDEX) 1 MG tablet Take 1 tablet by mouth once daily 90 tablet 0  . cholecalciferol (VITAMIN D) 1000 UNITS tablet Take 1,000 Units by mouth daily.    . cycloSPORINE (RESTASIS) 0.05 % ophthalmic emulsion Place 1 drop into both eyes 2 (two) times daily.    . hyaluronate sodium (RADIAPLEXRX) GEL Apply 1 application topically 3 (three) times daily. 170 g 0  . Omega-3 Fatty Acids (FISH OIL) 1000 MG CAPS Take 1 capsule by mouth daily.    . pantoprazole (PROTONIX) 40 MG tablet Take 1 tablet by mouth once daily (Patient taking differently: 40 mg daily as needed. ) 90 tablet 1  . Probiotic Product (PROBIOTIC-10 PO) Take 1 capsule by mouth daily.    . calcium carbonate (OS-CAL) 1250 (500 CA) MG chewable tablet Chew 1 tablet by mouth daily.    Marland Kitchen ELIQUIS 5 MG TABS tablet Take 1 tablet by mouth twice daily 60 tablet 0  . famotidine (PEPCID) 20 MG tablet Take 1 tablet (20 mg total) by mouth 2 (two) times daily. 30 tablet 0  . furosemide (LASIX) 20 MG tablet Take 1 tablet (20 mg total) by mouth daily as needed for fluid or edema. 30 tablet 0  . potassium chloride SA (K-DUR,KLOR-CON) 20 MEQ tablet Take 1 tablet (20 mEq  total) by mouth daily. (Patient taking differently: Take 20 mEq by mouth daily as needed (cramping). ) 30 tablet 0  . rosuvastatin (CRESTOR) 20 MG tablet Take 1 tablet (20 mg total) by mouth daily. 90 tablet 1  . valACYclovir (VALTREX) 500 MG tablet Take 1 tablet (500 mg total) by mouth 2 (two) times daily. 20 tablet 0  . vitamin E 1000 UNIT capsule Take 1,000 Units by mouth daily.    . metroNIDAZOLE (FLAGYL) 500 MG tablet Take 1 tablet (500 mg total) by mouth 3 (three) times daily. 21 tablet 0   No facility-administered medications prior to visit.    ROS Review of Systems  Constitutional: Negative for appetite change, diaphoresis, fatigue and unexpected weight change.  HENT: Negative.   Eyes: Negative for visual disturbance.  Respiratory: Negative for cough, chest tightness, shortness of breath and wheezing.   Cardiovascular: Negative for chest pain, palpitations and leg swelling.  Gastrointestinal: Negative for abdominal pain, blood in stool, constipation, diarrhea, nausea and vomiting.  Endocrine: Negative.   Genitourinary: Negative.  Negative for difficulty urinating and hematuria.  Musculoskeletal: Negative for arthralgias and myalgias.  Skin: Negative.  Negative for color change.  Neurological: Negative.  Negative for dizziness, weakness, light-headedness and headaches.  Hematological: Negative for adenopathy. Does not bruise/bleed easily.  Psychiatric/Behavioral: Negative.     Objective:  BP 138/70 (BP Location: Left Arm,  Patient Position: Sitting, Cuff Size: Large)   Pulse 65   Temp 98.1 F (36.7 C) (Oral)   Ht 5' 6"  (1.676 m)   Wt 200 lb (90.7 kg)   SpO2 95%   BMI 32.28 kg/m   BP Readings from Last 3 Encounters:  11/08/19 138/70  02/28/19 132/70  01/30/19 130/70    Wt Readings from Last 3 Encounters:  11/08/19 200 lb (90.7 kg)  02/28/19 223 lb (101.2 kg)  01/30/19 223 lb (101.2 kg)    Physical Exam Vitals reviewed.  Constitutional:      Appearance: She is  obese.  HENT:     Nose: Nose normal.     Mouth/Throat:     Mouth: Mucous membranes are moist.  Eyes:     General: No scleral icterus.    Conjunctiva/sclera: Conjunctivae normal.  Cardiovascular:     Rate and Rhythm: Normal rate and regular rhythm.     Heart sounds: No murmur.  Pulmonary:     Effort: Pulmonary effort is normal.     Breath sounds: No stridor. No wheezing, rhonchi or rales.  Abdominal:     General: Abdomen is flat.     Palpations: There is no mass.     Tenderness: There is no abdominal tenderness. There is no guarding.  Musculoskeletal:        General: No swelling. Normal range of motion.     Cervical back: Neck supple.     Right lower leg: No edema.     Left lower leg: No edema.  Lymphadenopathy:     Cervical: No cervical adenopathy.  Skin:    General: Skin is warm.     Coloration: Skin is not pale.  Neurological:     General: No focal deficit present.     Mental Status: She is alert.  Psychiatric:        Mood and Affect: Mood normal.        Behavior: Behavior normal.     Lab Results  Component Value Date   WBC 6.4 01/30/2019   HGB 15.5 (H) 01/30/2019   HCT 45.8 01/30/2019   PLT 258.0 01/30/2019   GLUCOSE 111 (H) 01/30/2019   CHOL 237 (H) 01/30/2019   TRIG 98.0 01/30/2019   HDL 58.20 01/30/2019   LDLDIRECT 158.4 07/17/2011   LDLCALC 159 (H) 01/30/2019   ALT 41 (H) 01/30/2019   AST 29 01/30/2019   NA 140 01/30/2019   K 4.3 01/30/2019   CL 105 01/30/2019   CREATININE 0.99 01/30/2019   BUN 18 01/30/2019   CO2 26 01/30/2019   TSH 3.45 01/30/2019   HGBA1C 6.0 01/30/2019    DG Bone Density  Result Date: 03/10/2019 EXAM: DUAL X-RAY ABSORPTIOMETRY (DXA) FOR BONE MINERAL DENSITY IMPRESSION: Referring Physician:  [name] Your patient completed a BMD test using Lunar IDXA DXA system ( analysis version: 16 ) manufactured by [employer]. Technologist: CG PATIENT: Name: Stacey Navarro, Stacey Navarro Patient ID: [id number] Birth Date: February 20, 1941 Height: 65.3 in.  Sex: Female Measured: 03/10/2019 Weight: 223.2 lbs. Indications: Advanced Age, Breast Cancer History, Caucasian, Estrogen Deficient, Family History of Osteoporosis, Pantoprazole, Postmenopausal Fractures: None Treatments: Calcium (E943.0), Vitamin D (E933.5) ASSESSMENT: The BMD measured at Femur Neck Right is 0.687 g/cm2 with a T-score of -2.5. This patient is considered osteoporotic according to [facility]) criteria. There has been a statistically significant decrease in BMD of Left hip since prior exam dated 03/01/2015. Prior DXA exam performed on Hologic device measures only unilateral hip (not  Total Mean Hip). Therefore, current Total Mean Hip cannot be compared to prior exam. The scan quality is limited by patient body habitus. Lumbar spine was not utilized due to advanced degenerative changes. Site Region Measured Date Measured Age YA BMD Significant CHANGE T-score DualFemur Neck Left 03/10/2019 76.4 -2.4 0.699 g/cm2 * DualFemur Neck Left 03/01/2015 72.3 -2.0 0.767 g/cm2 * DualFemur Neck Right 03/10/2019 76.4 -2.5 0.687 g/cm2 DualFemur Total Mean 03/10/2019 76.4 -1.5 0.814 g/cm2 Left Forearm Radius 33% 03/10/2019 76.4 -1.1 0.786 g/cm2 World Health Organization [facility]) criteria for post-menopausal, Caucasian Women: Normal       T-score at or above -1 SD Osteopenia   T-score between -1 and -2.5 SD Osteoporosis T-score at or below -2.5 SD RECOMMENDATION: 1. All patients should optimize calcium and vitamin D intake. 2. Consider FDA approved medical therapies in postmenopausal women and men aged 78 years and older, based on the following: a. A hip or vertebral (clinical or morphometric) fracture b. T- score < or = -2.5 at the femoral neck or spine after appropriate evaluation to exclude secondary causes c. Low bone mass (T-score between -1.0 and -2.5 at the femoral neck or spine) and a 10 year probability of a hip fracture > or = 3% or a 10 year probability of a major osteoporosis-related fracture  > or = 20% based on the [place]-adapted WHO algorithm d. Clinician judgment and/or patient preferences may indicate treatment for people with 10-year fracture probabilities above or below these levels FOLLOW-UP: Patients with diagnosis of osteoporosis or at high risk for fracture should have regular bone mineral density tests. For patients eligible for Medicare, routine testing is allowed once every 2 years. The testing frequency can be increased to one year for patients who have rapidly progressing disease, those who are receiving or discontinuing medical therapy to restore bone mass, or have additional risk factors. [facility] FRAX* 10-year Probability of Fracture Based on femoral neck BMD: DualFemur (Right) Major Osteoporotic Fracture: 16.2% Hip Fracture:                5.1% Population:                  [place] (Caucasian) Risk Factors:                None *FRAX is a Materials engineer of the [employer] of [employer] for Metabolic Bone Disease, a World Pharmacologist (WHO) [facility]. ASSESSMENT: The probability of a major osteoporotic fracture is 16.2% within the next ten years. The probability of a hip fracture is 5.1% within the next ten years. Electronically Signed   By: Dorise Bullion III M.D   On: 03/10/2019 15:20    Assessment & Plan:   Stacey Navarro was seen today for follow-up.  Diagnoses and all orders for this visit:  Hyperglycemia -     Basic metabolic panel; Future  Hypercoagulable state, primary [facility])- Will check a hypercoagulable panel to screen for secondary causes of thrombosis. -     Hypercoagulable panel, comprehensive; Future -     Basic metabolic panel; Future -     CBC with Differential/Platelet; Future -     D-dimer, quantitative (not at [facility]); Future -     apixaban (ELIQUIS) 2.5 MG TABS tablet; Take 1 tablet (2.5 mg total) by mouth 2 (two) times daily.  Chronic deep vein thrombosis (DVT) of calf muscle vein of left lower extremity ([place])- Her  D-dimer is normal for age.  I recommended that she decrease the Eliquis  dose since she has completed 9 months of high-dose Eliquis for treatment of the DVT. -     Basic metabolic panel; Future -     CBC with Differential/Platelet; Future -     D-dimer, quantitative (not at [facility]); Future -     apixaban (ELIQUIS) 2.5 MG TABS tablet; Take 1 tablet (2.5 mg total) by mouth 2 (two) times daily.   I have discontinued Stacey Navarro's calcium carbonate, furosemide, potassium chloride SA, valACYclovir, rosuvastatin, famotidine, metroNIDAZOLE, Eliquis, and vitamin E. I am also having her start on apixaban. Additionally, I am having her maintain her cholecalciferol, cycloSPORINE, hyaluronate sodium, pantoprazole, Fish Oil, Probiotic Product (PROBIOTIC-10 PO), and anastrozole.  Meds ordered this encounter  Medications  . apixaban (ELIQUIS) 2.5 MG TABS tablet    Sig: Take 1 tablet (2.5 mg total) by mouth 2 (two) times daily.    Dispense:  180 tablet    Refill:  1     Follow-up: Return in about 6 months (around 05/09/2020).  [name], MD

## 2019-11-08 NOTE — Patient Instructions (Signed)

## 2019-11-10 ENCOUNTER — Encounter: Payer: Self-pay | Admitting: Internal Medicine

## 2019-11-11 ENCOUNTER — Encounter: Payer: Self-pay | Admitting: Internal Medicine

## 2019-11-15 ENCOUNTER — Other Ambulatory Visit: Payer: Self-pay | Admitting: Hematology and Oncology

## 2019-11-15 DIAGNOSIS — Z853 Personal history of malignant neoplasm of breast: Secondary | ICD-10-CM

## 2019-11-24 ENCOUNTER — Encounter: Payer: Self-pay | Admitting: Internal Medicine

## 2019-11-24 LAB — HYPERCOAGULABLE PANEL, COMPREHENSIVE
APTT: 23.9 s
AT III Act/Nor PPP Chro: 106 %
Act. Prt C Resist w/FV Defic.: 2 ratio — ABNORMAL LOW
Anticardiolipin Ab, IgG: <10 [GPL'U]
Anticardiolipin Ab, IgM: 28 [MPL'U] — ABNORMAL HIGH
Beta-2 Glycoprotein I, IgA: <10 SAU
Beta-2 Glycoprotein I, IgG: <10 SGU
Beta-2 Glycoprotein I, IgM: <10 SMU
DRVVT Screen Seconds: 32.6 s
Factor VII Antigen**: 139 %
Factor VIII Activity: 176 % — ABNORMAL HIGH
Hexagonal Phospholipid Neutral: 0 s
Homocysteine: 14.2 umol/L
Prot C Ag Act/Nor PPP Imm: 108 %
Prot S Ag Act/Nor PPP Imm: 105 %
Protein C Ag/FVII Ag Ratio**: 0.8 ratio
Protein S Ag/FVII Ag Ratio**: 0.8 ratio

## 2019-11-24 LAB — FACTOR V LEIDEN

## 2019-12-01 ENCOUNTER — Ambulatory Visit: Payer: Medicare Other | Admitting: Hematology and Oncology

## 2019-12-07 ENCOUNTER — Ambulatory Visit
Admission: RE | Admit: 2019-12-07 | Discharge: 2019-12-07 | Disposition: A | Discharge status: Home / Self Care | Payer: Medicare Other | Source: Ambulatory Visit | Attending: Hematology and Oncology | Admitting: Hematology and Oncology

## 2019-12-07 ENCOUNTER — Other Ambulatory Visit: Payer: Self-pay

## 2019-12-07 DIAGNOSIS — Z853 Personal history of malignant neoplasm of breast: Secondary | ICD-10-CM

## 2019-12-07 DIAGNOSIS — R928 Other abnormal and inconclusive findings on diagnostic imaging of breast: Secondary | ICD-10-CM | POA: Diagnosis not present

## 2019-12-07 NOTE — Progress Notes (Signed)
Patient Care Team: [name], MD as PCP - General (Internal Medicine) [name], MD as Consulting Physician (General Surgery) [name], MD as Consulting Physician (Hematology and Oncology) [name], MD (Inactive) as Consulting Physician (Radiation Oncology) Mauro Kaufmann, RN as Registered Nurse Rockwell Germany, RN as Registered Nurse Holley Bouche, NP (Inactive) as Nurse Practitioner (Nurse Practitioner) Marchia Bond, MD as Consulting Physician (Orthopedic Surgery) Sylvan Cheese, NP as Nurse Practitioner (Hematology and Oncology) Charlton Haws, [facility] as Pharmacist (Pharmacist)  DIAGNOSIS:    ICD-10-CM   1. Malignant neoplasm of upper-inner quadrant of right breast in female, estrogen receptor positive ([place])  C50.211    Z17.0     SUMMARY OF ONCOLOGIC HISTORY: Oncology History  Breast cancer of upper-inner quadrant of right female breast ([place])  10/19/2014 Mammogram   Right breast mass 3.1 cm upper quadrant, 6 cm from the nipple   10/19/2014 Initial Biopsy   Right breast core needle bx: invasive ductal carcinoma, grade 2-3, ER+ (100%), PR+ (48%), HER-2 negative (ratio 0.6), Ki-67 28%   10/20/2014 Clinical Stage   Stage IIA: T2 N0   11/13/2014 Definitive Surgery   Right Lumpectomy/SLNB Dalbert Batman): IDC, grade 3, 2.7 cm size, 3 LN removed and negative for malignancy (0/3) margins clear. HER2/neu repeated and remains negative (ratio 0.88).   11/13/2014 Pathologic Stage   Stage IIA: pT2 pN0   11/13/2014 Oncotype testing   Score 19 (12% ROR). No chemotherapy Lindi Adie).   12/26/2014 - 01/29/2015 Radiation Therapy   Adjuvant RT Valere Dross): Right breast 48 Gy over 24 sessions   02/14/2015 -  Anti-estrogen oral therapy   Anastrozole 1 mg daily. Planned duration of therapy 5 years [place])    Survivorship   Survivorship care plan completed and mailed to patient in lieu of inperson visit.     CHIEF COMPLIANT: Follow-up of right breast cancer on  anastrozole therapy  INTERVAL HISTORY: Stacey Navarro is a 79 y.o. with above-mentioned history of right breast cancer treated with lumpectomy, radiation, and who is currently on anastrozole therapy. Mammogram on 12/07/19 showed no evidence of malignancy bilaterally. She presents today for follow-up.  She stays very active with camping and other activities.  She had a rash which improved with radio Plex.  She had a bone density test that revealed osteoporosis.  And today we are discussing the role of bisphosphonate therapy.   ALLERGIES:  is allergic to xarelto [rivaroxaban].  MEDICATIONS:  Current Outpatient Medications  Medication Sig Dispense Refill  . anastrozole (ARIMIDEX) 1 MG tablet Take 1 tablet by mouth once daily 90 tablet 0  . apixaban (ELIQUIS) 2.5 MG TABS tablet Take 1 tablet (2.5 mg total) by mouth 2 (two) times daily. 180 tablet 1  . cholecalciferol (VITAMIN D) 1000 UNITS tablet Take 1,000 Units by mouth daily.    . cycloSPORINE (RESTASIS) 0.05 % ophthalmic emulsion Place 1 drop into both eyes 2 (two) times daily.    . hyaluronate sodium (RADIAPLEXRX) GEL Apply 1 application topically 3 (three) times daily. 170 g 0  . Omega-3 Fatty Acids (FISH OIL) 1000 MG CAPS Take 1 capsule by mouth daily.    . pantoprazole (PROTONIX) 40 MG tablet Take 1 tablet by mouth once daily (Patient taking differently: 40 mg daily as needed. ) 90 tablet 1  . Probiotic Product (PROBIOTIC-10 PO) Take 1 capsule by mouth daily.     No current facility-administered medications for this visit.    PHYSICAL EXAMINATION: ECOG PERFORMANCE STATUS: 1 - Symptomatic  but completely ambulatory  Vitals:   12/08/19 0949  BP: 126/60  Pulse: 66  Resp: 18  Temp: 98.2 F (36.8 C)  SpO2: 96%   Filed Weights   12/08/19 0949  Weight: 200 lb (90.7 kg)    BREAST: No palpable masses or nodules in either right or left breasts. No palpable axillary supraclavicular or infraclavicular adenopathy no breast tenderness or  nipple discharge. (exam performed in the presence of a chaperone) Moist and erythematous area underneath the left breast: Fungal infection  LABORATORY DATA:  I have reviewed the data as listed CMP Latest Ref Rng & Units 11/08/2019 01/30/2019 07/21/2017  Glucose 70 - 99 mg/dL 111(H) 111(H) 105(H)  BUN 6 - 23 mg/dL 18 18 20   Creatinine 0.40 - 1.20 mg/dL 0.80 0.99 0.95  Sodium 135 - 145 mEq/L 139 140 140  Potassium 3.5 - 5.1 mEq/L 4.4 4.3 4.0  Chloride 96 - 112 mEq/L 104 105 103  CO2 19 - 32 mEq/L 28 26 29   Calcium 8.4 - 10.5 mg/dL 9.7 10.1 9.7  Total Protein 6.0 - 8.3 g/dL - 7.4 7.4  Total Bilirubin 0.2 - 1.2 mg/dL - 0.7 0.5  Alkaline Phos 39 - 117 U/L - 118(H) 103  AST 0 - 37 U/L - 29 30  ALT 0 - 35 U/L - 41(H) 34    Lab Results  Component Value Date   WBC 5.5 11/08/2019   HGB 16.6 (H) 11/08/2019   HCT 49.8 (H) 11/08/2019   MCV 100.1 (H) 11/08/2019   PLT 243.0 11/08/2019   NEUTROABS 2.9 11/08/2019    ASSESSMENT & PLAN:  Breast cancer of upper-inner quadrant of right female breast Rt Lumpectomy 11/14/14: IDC Grade 3, 2.7 cm size, 0/3 LN , Margins clear; T2N0 Stage IIA Right breast invasive ductal carcinoma diagnosed with screening mammogram, 3.1 cm by ultrasound in the upper quadrant 6 cm from the nipple, grade 2-3, EF 100%, PR 48%, HER-2 negative ratio 0.6, Ki-67 was 28%, Oncotype DX score 19, 12% risk of recurrence, status post radiation 4800 cGy in 24 sessions completed 01/29/2015  Treatment plan: antiestrogen therapy with anastrozole 1 mg daily started 02/14/2015, plan for 7 years.  Anastrozole toxicities: Tolerating it very well without any side effects  Breast Cancer Surveillance: 1. Breast exam5/14/2021: Benign 2. Mammogramsand ultrasound 12/07/2019:No concern for cancer, breast density categoryB 3. Bone density:  03/10/2019: T score -2.5: Osteoporosis  Osteoporosis: Recommended calcium vitamin D and bisphosphonate therapy.  I sent a prescription for Fosamax.  She  does not have a dentist and we evaluated her teeth and did not find any carious teeth.  She does have fillings.  Fungal infection underneath left breast: I sent a prescription for nystatin powder  Return to clinic in 1 year for follow-up    No orders of the defined types were placed in this encounter.  The patient has a good understanding of the overall plan. she agrees with it. she will call with any problems that may develop before the next visit here.  Total time spent: 20 mins including face to face time and time spent for planning, charting and coordination of care  [name], MD 12/08/2019  I, Cloyde Reams Dorshimer, am acting as scribe for Dr. [name].  I have reviewed the above documentation for accuracy and completeness, and I agree with the above.

## 2019-12-08 ENCOUNTER — Other Ambulatory Visit: Payer: Self-pay

## 2019-12-08 ENCOUNTER — Inpatient Hospital Stay: Payer: Medicare Other | Attending: Hematology and Oncology | Admitting: Hematology and Oncology

## 2019-12-08 DIAGNOSIS — C50211 Malignant neoplasm of upper-inner quadrant of right female breast: Secondary | ICD-10-CM | POA: Insufficient documentation

## 2019-12-08 DIAGNOSIS — B372 Candidiasis of skin and nail: Secondary | ICD-10-CM | POA: Diagnosis not present

## 2019-12-08 DIAGNOSIS — Z170 Estrogen receptor positive status [ER+]: Secondary | ICD-10-CM | POA: Insufficient documentation

## 2019-12-08 DIAGNOSIS — M810 Age-related osteoporosis without current pathological fracture: Secondary | ICD-10-CM | POA: Insufficient documentation

## 2019-12-08 DIAGNOSIS — Z923 Personal history of irradiation: Secondary | ICD-10-CM | POA: Diagnosis not present

## 2019-12-08 DIAGNOSIS — Z79811 Long term (current) use of aromatase inhibitors: Secondary | ICD-10-CM | POA: Diagnosis not present

## 2019-12-08 MED ORDER — ANASTROZOLE 1 MG PO TABS: 1.0000 mg | ORAL_TABLET | Freq: Every day | ORAL | 3 refills | Status: DC

## 2019-12-08 MED ORDER — ALENDRONATE SODIUM 70 MG PO TABS
70.0000 mg | ORAL_TABLET | ORAL | 3 refills | Status: DC
Start: 2019-12-08 — End: 2021-03-24

## 2019-12-08 MED ORDER — NYSTATIN 100000 UNIT/GM EX POWD
1.0000 | Freq: Three times a day (TID) | CUTANEOUS | 0 refills | Status: DC
Start: 2019-12-08 — End: 2020-11-25

## 2019-12-08 NOTE — Assessment & Plan Note (Signed)
Rt Lumpectomy 11/14/14: IDC Grade 3, 2.7 cm size, 0/3 LN , Margins clear; T2N0 Stage IIA Right breast invasive ductal carcinoma diagnosed with screening mammogram, 3.1 cm by ultrasound in the upper quadrant 6 cm from the nipple, grade 2-3, EF 100%, PR 48%, HER-2 negative ratio 0.6, Ki-67 was 28%, Oncotype DX score 19, 12% risk of recurrence, status post radiation 4800 cGy in 24 sessions completed 01/29/2015  Treatment plan: antiestrogen therapy with anastrozole 1 mg daily started 02/14/2015, plan for 7 years.  Anastrozole toxicities: Tolerating it very well without any side effects  Breast Cancer Surveillance: 1. Breast exam5/14/2021: Benign 2. Mammogramsand ultrasound 12/07/2019:No concern for cancer, breast density categoryB 3. Bone density:  03/10/2019: T score -2.5: Osteoporosis  Osteoporosis: Recommended calcium vitamin D and bisphosphonate therapy.  Return to clinic in 1 year for follow-up

## 2019-12-12 ENCOUNTER — Telehealth: Payer: Self-pay | Admitting: Hematology and Oncology

## 2019-12-12 NOTE — Telephone Encounter (Signed)
Scheduled per 05/14 los, patient has been called and notified.

## 2019-12-15 DIAGNOSIS — M25562 Pain in left knee: Secondary | ICD-10-CM | POA: Diagnosis not present

## 2019-12-15 DIAGNOSIS — M25561 Pain in right knee: Secondary | ICD-10-CM | POA: Diagnosis not present

## 2020-01-30 ENCOUNTER — Other Ambulatory Visit: Payer: Self-pay | Admitting: *Deleted

## 2020-01-30 ENCOUNTER — Telehealth: Payer: Self-pay | Admitting: Internal Medicine

## 2020-01-30 DIAGNOSIS — C50211 Malignant neoplasm of upper-inner quadrant of right female breast: Secondary | ICD-10-CM

## 2020-01-30 DIAGNOSIS — D6859 Other primary thrombophilia: Secondary | ICD-10-CM

## 2020-01-30 DIAGNOSIS — I82562 Chronic embolism and thrombosis of left calf muscular vein: Secondary | ICD-10-CM

## 2020-01-30 MED ORDER — ANASTROZOLE 1 MG PO TABS: 1.0000 mg | ORAL_TABLET | Freq: Every day | ORAL | 3 refills | Status: DC

## 2020-01-30 MED ORDER — APIXABAN 2.5 MG PO TABS: 2.5000 mg | ORAL_TABLET | Freq: Two times a day (BID) | ORAL | 1 refills | Status: DC

## 2020-01-30 NOTE — Telephone Encounter (Signed)
erx sent as requested.  

## 2020-01-30 NOTE — Telephone Encounter (Signed)
   1.Medication Requested:apixaban (ELIQUIS) 2.5 MG TABS tablet  2. Pharmacy (Name, Street, [place]):[facility] [place], [place]  3. On Med List: yes  4. Last Visit with PCP: 11/08/19  5. Next visit date with PCP: n/a   Agent: Please be advised that RX refills may take up to 3 business days. We ask that you follow-up with your pharmacy.

## 2020-03-03 ENCOUNTER — Other Ambulatory Visit: Payer: Self-pay | Admitting: Internal Medicine

## 2020-04-15 ENCOUNTER — Telehealth: Payer: Medicare Other

## 2020-04-15 NOTE — Chronic Care Management (AMB) (Deleted)
Chronic Care Management Pharmacy  Name: Stacey Navarro  MRN: [id number] DOB: 1940-10-30   Chief Complaint/ HPI  Stacey Navarro,  79 y.o. , female presents for their Follow-Up CCM visit with the clinical pharmacist via telephone due to COVID-19 Pandemic.  PCP : [name], MD  Patient Care Team: [name], MD as PCP - General (Internal Medicine) [name], MD as Consulting Physician (General Surgery) [name], MD as Consulting Physician (Hematology and Oncology) [name], MD (Inactive) as Consulting Physician (Radiation Oncology) Mauro Kaufmann, RN as Registered Nurse Rockwell Germany, RN as Registered Nurse Holley Bouche, NP (Inactive) as Nurse Practitioner (Nurse Practitioner) Marchia Bond, MD as Consulting Physician (Orthopedic Surgery) Sylvan Cheese, NP as Nurse Practitioner (Hematology and Oncology) Charlton Haws, [facility] as Pharmacist (Pharmacist)   Their chronic conditions include: HLD, GERD, hx DVT, Hx breast cancer  From [facility], has lived in [place], [place], [place]. She has a daughter in town. She reports her biggest health issues are Eliquis and weight.   Office Visits: 11/08/19 Dr Ronnald Ramp OV: reduced Eliquis to 2.5 mg d/t completing 9 mos of treatment for DVT. Heterozygous Factor V mutation -indefinite anticoagulation.  08/09/19 NP Jasmine December VV: diarrhea f/u, started flagyl x  Days. 08/01/19 NP Jasmine December VV: c/o diarrhea, rec'd BRAT, no meds 02/28/19 NP Jasmine December VV: c/o rash after starting Xarelto and Crestor. Stopped both meds, switched to Eliquis, plan to re-introduce Crestor after a week or so. 01/30/19 Dr Ronnald Ramp:  Elevated ASCVD risk so started rosuvastatin 20 mg. [place] positive for DVT, started Xarelto.  Consult Visit: 12/08/19 Dr Lindi Adie (heme/onc): rx'd alendronate for osteoporosis.   12/02/18 Dr Lindi Adie (heme/onc): tolerating anastrozole, no plans to stop, RTC 1 year.  Medications: Outpatient Encounter Medications as of  04/15/2020  Medication Sig  . alendronate (FOSAMAX) 70 MG tablet Take 1 tablet (70 mg total) by mouth once a week. Take with a full glass of water on an empty stomach.  Marland Kitchen anastrozole (ARIMIDEX) 1 MG tablet Take 1 tablet (1 mg total) by mouth daily.  Marland Kitchen apixaban (ELIQUIS) 2.5 MG TABS tablet Take 1 tablet (2.5 mg total) by mouth 2 (two) times daily.  . cholecalciferol (VITAMIN D) 1000 UNITS tablet Take 1,000 Units by mouth daily.  . cycloSPORINE (RESTASIS) 0.05 % ophthalmic emulsion Place 1 drop into both eyes 2 (two) times daily.  . hyaluronate sodium (RADIAPLEXRX) GEL Apply 1 application topically 3 (three) times daily.  Marland Kitchen nystatin (MYCOSTATIN/NYSTOP) powder Apply 1 application topically 3 (three) times daily.  . Omega-3 Fatty Acids (FISH OIL) 1000 MG CAPS Take 1 capsule by mouth daily.  . pantoprazole (PROTONIX) 40 MG tablet Take 1 tablet (40 mg total) by mouth daily as needed.  . Probiotic Product (PROBIOTIC-10 PO) Take 1 capsule by mouth daily.   No facility-administered encounter medications on file as of 04/15/2020.     Current Diagnosis/Assessment:  Goals Addressed   None     DVT   DVT on [place] July 2020.  Heterozygous Factor V Leiden mutation.  Kidney Function Lab Results  Component Value Date/Time   CREATININE 0.80 11/08/2019 01:55 PM   CREATININE 0.99 01/30/2019 11:29 AM   CREATININE 1.0 10/31/2014 08:18 AM   GFR 69.54 11/08/2019 01:55 PM   K 4.4 11/08/2019 01:55 PM   K 4.3 01/30/2019 11:29 AM   K 4.6 10/31/2014 08:18 AM   Patient has failed these meds in past: Xarelto (rash) Patient is currently controlled on the following medications:  Eliquis 2.5 mg BID   We discussed:  Unprovoked DVT, heterozygous V mutatation, indefinite anticoagulation  Plan  Continue current medications    Hyperlipidemia   LDL goal 30% reduction  Lipid Panel     Component Value Date/Time   CHOL 237 (H) 01/30/2019 1129   TRIG 98.0 01/30/2019 1129   HDL 58.20 01/30/2019 1129    CHOLHDL 4 01/30/2019 1129   VLDL 19.6 01/30/2019 1129   LDLCALC 159 (H) 01/30/2019 1129   LDLDIRECT 158.4 07/17/2011 1016   The 10-year ASCVD risk score Mikey Bussing DC Jr., et al., 2013) is: 19.3%   Values used to calculate the score:     Age: 71 years     Sex: Female     Is Non-Hispanic African American: No     Diabetic: No     Tobacco smoker: No     Systolic Blood Pressure: 916 mmHg     Is BP treated: No     HDL Cholesterol: 58.2 mg/dL     Total Cholesterol: 237 mg/dL  Patient has failed these meds in past: n/a Patient is currently uncontrolled on the following medications:   OTC fish oil  We discussed:  diet and exercise extensively; pt is not taking statin as prescribed, she has begun lifestyle changes and having some success. Cut down on portions, exercise, eating decent food and reports weight loss. Discussed benefits of statin outside of just lowering cholesterol including prevention of CV disease. Pt agrees if lipids remain elevated at next check she would benefit from statin.  Plan  Continue control with diet and exercise    Hx Breast Cancer   Patient has failed these meds in past: n/a Patient is currently controlled on the following medications:   anastrozole 1 mg daily (started 2016)  We discussed:  Pt reports she is in her last year of anastrozole therapy, planning to f/u with oncologist this summer.  Plan  Continue current medications   GERD   Patient has failed these meds in past: n/a Patient is currently controlled on the following medications:   pantoprazole 40 mg PRN  We discussed:  Since improvements in diet and weight loss, pt has not had issues with reflux. Stopped taking famotidine and only takes pantoprazole PRN.   Plan  Continue current medications  Osteoporosis   Last DEXA Scan: 03/10/2019  T-Score femoral neck: -2.5  T-Score total hip: -1.5  T-Score lumbar spine: n/a  T-Score forearm radius: -1.1  10-year probability of major  osteoporotic fracture: 16.2%  10-year probability of hip fracture: 5.1%  No results found for: VD25OH   Patient is a candidate for pharmacologic treatment due to T-Score < -2.5 in femoral neck  Patient has failed these meds in past: *** Patient is currently {CHL Controlled/[phone]} on the following medications:  . Alendronate 70 mg weekly  calcium carbonate 500 mg BID  Vitamin D 1000 IU daily  We discussed:  {Osteoporosis Counseling:23892}  Plan  Continue {CHL [facility] Pharmacy [phone]}  Health Maintenance   Patient is currently controlled on the following medications:   Vitamin E  probiotics,   We discussed:  Pt is satisfied with OTC regimen  Plan  Continue current medications    Medication Management   Pt uses [facility] for all medications Does not use pill box - uses special container for camping in the summer Pt endorses 100% compliance  We discussed:  Pt has system in place that works for her, never really misses doses of meds. [facility] is  one of few pharmacies that takes her insurance.  Plan  Continue current med management strategy    Follow up: *** month phone visit  Charlene Brooke, PharmD, BCACP Clinical Pharmacist [facility] [phone]

## 2020-08-12 ENCOUNTER — Other Ambulatory Visit: Payer: Self-pay | Admitting: Internal Medicine

## 2020-08-12 DIAGNOSIS — D6859 Other primary thrombophilia: Secondary | ICD-10-CM

## 2020-08-12 DIAGNOSIS — I82562 Chronic embolism and thrombosis of left calf muscular vein: Secondary | ICD-10-CM

## 2020-08-13 ENCOUNTER — Other Ambulatory Visit: Payer: Self-pay | Admitting: Internal Medicine

## 2020-08-13 DIAGNOSIS — I82562 Chronic embolism and thrombosis of left calf muscular vein: Secondary | ICD-10-CM

## 2020-08-13 DIAGNOSIS — D6859 Other primary thrombophilia: Secondary | ICD-10-CM

## 2020-09-27 ENCOUNTER — Other Ambulatory Visit: Payer: Self-pay | Admitting: Internal Medicine

## 2020-11-07 ENCOUNTER — Telehealth (INDEPENDENT_AMBULATORY_CARE_PROVIDER_SITE_OTHER): Payer: Medicare Other | Admitting: Family Medicine

## 2020-11-07 ENCOUNTER — Telehealth: Payer: Self-pay | Admitting: Internal Medicine

## 2020-11-07 DIAGNOSIS — N760 Acute vaginitis: Secondary | ICD-10-CM | POA: Diagnosis not present

## 2020-11-07 MED ORDER — FLUCONAZOLE 150 MG PO TABS
150.0000 mg | ORAL_TABLET | Freq: Once | ORAL | 0 refills | Status: AC
Start: 2020-11-07 — End: 2020-11-07

## 2020-11-07 NOTE — Progress Notes (Signed)
Virtual Visit via Telephone Note  I connected with Stacey Navarro on 11/07/20 at  5:40 PM EDT by telephone and verified that I am speaking with the correct person using two identifiers.   I discussed the limitations, risks, security and privacy concerns of performing an evaluation and management service by telephone and the availability of in person appointments. I also discussed with the patient that there may be a patient responsible charge related to this service. The patient expressed understanding and agreed to proceed.  Location patient: home, [place] Location provider: work or home office Participants present for the call: patient, provider Patient did not have a visit with me in the prior 7 days to address this/these issue(s).   History of Present Illness:  Acute telemedicine visit for a "yeast infection" : -Onset: 1 week -Symptoms include:  lots of itching of the vulvovaginal skin with inflammation and some vaginal discharge -she tried otc cream but did not work -reports has hx of vulvovag yeast infections off and on numerous times in her life -Denies: pelv/abd pain, yellow or green discharge, fevers, malaise, dysuria -not sexually active for a very long time    Observations/Objective: Patient sounds cheerful and well on the phone. I do not appreciate any SOB. Speech and thought processing are grossly intact. Patient reported vitals:  Assessment and Plan:  Acute vaginitis  -we discussed possible serious and likely etiologies, options for evaluation and workup, limitations of telemedicine visit vs in person visit, treatment, treatment risks and precautions. Pt prefers to treat via telemedicine empirically rather than in person at this moment.  Discussed various causes of vulvovaginitis and the need for an in person evaluation to sort through various etiologies.  In her case yeast vaginitis or BV are most likely versus other.  She prefers to try something over the weekend, as  cannot get an appointment next week due to the holiday.  Agrees to seek in person care through her primary care office on Monday if not doing better.  Opted to try Diflucan.  Also advised against harsh soaps or scrubbing.  Could try a small amount of hydrocortisone cream and Aquaphor to calm down the inflammation on the labia.  Advised not to use more than once or twice a day. Scheduled follow up with PCP offered: She agrees to contact her primary care office on Monday to schedule follow-up if symptoms are not resolved. Advised to seek prompt in person care at an urgent care over the weekend if worsening or new symptoms arise.  Advised of options for inperson care in case PCP office not available. Did let the patient know that I only do telemedicine shifts for [facility] on Tuesdays and Thursdays and advised a follow up visit with PCP or at an [facility] if has further questions or concerns.   Follow Up Instructions:  I did not refer this patient for an OV with me in the next 24 hours for this/these issue(s).  I discussed the assessment and treatment plan with the patient. The patient was provided an opportunity to ask questions and all were answered. The patient agreed with the plan and demonstrated an understanding of the instructions.   I spent 15 minutes on the date of this visit in the care of this patient. See summary of tasks completed to properly care for this patient in the detailed notes above which also included counseling of above, review of PMH, medications, allergies, evaluation of the patient and ordering and/or  instructing patient on testing and care  options.     [name], DO

## 2020-11-07 NOTE — Patient Instructions (Signed)
-  I sent the medication(s) we discussed to your pharmacy: Meds ordered this encounter  Medications  . fluconazole (DIFLUCAN) 150 MG tablet    Sig: Take 1 tablet (150 mg total) by mouth once for 1 dose.    Dispense:  1 tablet    Refill:  0   Can use a small amount of Aquaphor with hydrocortisone cream 1-2 times daily for a few days.  No harsh soaps or scrubbing, douches or other products to the labia and vulva.  Follow-up with your primary care office to request an in person visit on Monday unless symptoms are completely resolved.  I hope you are feeling better soon!  Seek in person care at an urgent care over the weekend promptly if your symptoms worsen or new concerns arise or you are not improving with treatment.  It was nice to meet you today. I help [facility] out with telemedicine visits on Tuesdays and Thursdays and am available for visits on those days. If you have any concerns or questions following this visit please schedule a follow up visit with your Primary Care doctor or seek care at a local urgent care clinic to avoid delays in care.

## 2020-11-07 NOTE — Telephone Encounter (Signed)
Patient states she thinks she has a yeast infection, she is wondering if she can get something sent in for it as well for the itching. [facility], [address] Phone:  [phone]  Fax:  [phone]

## 2020-11-07 NOTE — Telephone Encounter (Signed)
Denied. Pt needs an OV. She has not been seen since 11/08/2019.

## 2020-11-19 ENCOUNTER — Other Ambulatory Visit: Payer: Self-pay

## 2020-11-20 ENCOUNTER — Ambulatory Visit (INDEPENDENT_AMBULATORY_CARE_PROVIDER_SITE_OTHER): Payer: Medicare Other | Admitting: Internal Medicine

## 2020-11-20 ENCOUNTER — Encounter: Payer: Self-pay | Admitting: Internal Medicine

## 2020-11-20 ENCOUNTER — Other Ambulatory Visit: Payer: Self-pay

## 2020-11-20 ENCOUNTER — Ambulatory Visit (INDEPENDENT_AMBULATORY_CARE_PROVIDER_SITE_OTHER): Payer: Medicare Other

## 2020-11-20 VITALS — BP 120/70 | HR 74 | Temp 97.6°F | Ht 66.0 in | Wt 215.0 lb

## 2020-11-20 VITALS — BP 120/70 | HR 74 | Temp 97.6°F | Ht 66.0 in | Wt 215.2 lb

## 2020-11-20 DIAGNOSIS — E785 Hyperlipidemia, unspecified: Secondary | ICD-10-CM | POA: Diagnosis not present

## 2020-11-20 DIAGNOSIS — R7303 Prediabetes: Secondary | ICD-10-CM | POA: Diagnosis not present

## 2020-11-20 DIAGNOSIS — D6859 Other primary thrombophilia: Secondary | ICD-10-CM | POA: Diagnosis not present

## 2020-11-20 DIAGNOSIS — Z0000 Encounter for general adult medical examination without abnormal findings: Secondary | ICD-10-CM

## 2020-11-20 DIAGNOSIS — R7989 Other specified abnormal findings of blood chemistry: Secondary | ICD-10-CM

## 2020-11-20 LAB — CBC WITH DIFFERENTIAL/PLATELET
Basophils Absolute: 0.1 10*3/uL (ref 0.0–0.1)
Basophils Relative: 0.7 % (ref 0.0–3.0)
Eosinophils Absolute: 0.5 10*3/uL (ref 0.0–0.7)
Eosinophils Relative: 6.3 % — ABNORMAL HIGH (ref 0.0–5.0)
HCT: 45.5 % (ref 36.0–46.0)
Hemoglobin: 15.4 g/dL — ABNORMAL HIGH (ref 12.0–15.0)
Lymphocytes Relative: 37.4 % (ref 12.0–46.0)
Lymphs Abs: 2.8 10*3/uL (ref 0.7–4.0)
MCHC: 33.7 g/dL (ref 30.0–36.0)
MCV: 98.8 fl (ref 78.0–100.0)
Monocytes Absolute: 0.8 10*3/uL (ref 0.1–1.0)
Monocytes Relative: 10.3 % (ref 3.0–12.0)
Neutro Abs: 3.4 10*3/uL (ref 1.4–7.7)
Neutrophils Relative %: 45.3 % (ref 43.0–77.0)
Platelets: 242 10*3/uL (ref 150.0–400.0)
RBC: 4.61 Mil/uL (ref 3.87–5.11)
RDW: 13.6 % (ref 11.5–15.5)
WBC: 7.5 10*3/uL (ref 4.0–10.5)

## 2020-11-20 LAB — HEPATIC FUNCTION PANEL
ALT: 54 U/L — ABNORMAL HIGH (ref 0–35)
AST: 43 U/L — ABNORMAL HIGH (ref 0–37)
Albumin: 4.2 g/dL (ref 3.5–5.2)
Alkaline Phosphatase: 98 U/L (ref 39–117)
Bilirubin, Direct: 0.1 mg/dL (ref 0.0–0.3)
Total Bilirubin: 0.7 mg/dL (ref 0.2–1.2)
Total Protein: 7.5 g/dL (ref 6.0–8.3)

## 2020-11-20 LAB — LIPID PANEL
Cholesterol: 247 mg/dL — ABNORMAL HIGH (ref 0–200)
HDL: 64.2 mg/dL (ref >39.00)
LDL Cholesterol: 163 mg/dL — ABNORMAL HIGH (ref 0–99)
NonHDL: 183.04
Total CHOL/HDL Ratio: 4
Triglycerides: 100 mg/dL (ref 0.0–149.0)
VLDL: 20 mg/dL (ref 0.0–40.0)

## 2020-11-20 LAB — BASIC METABOLIC PANEL
BUN: 21 mg/dL (ref 6–23)
CO2: 29 mEq/L (ref 19–32)
Calcium: 10.1 mg/dL (ref 8.4–10.5)
Chloride: 103 mEq/L (ref 96–112)
Creatinine, Ser: 0.89 mg/dL (ref 0.40–1.20)
GFR: 62.23 mL/min (ref >60.00)
Glucose, Bld: 100 mg/dL — ABNORMAL HIGH (ref 70–99)
Potassium: 4.3 mEq/L (ref 3.5–5.1)
Sodium: 140 mEq/L (ref 135–145)

## 2020-11-20 LAB — HEMOGLOBIN A1C: Hgb A1c MFr Bld: 5.8 % (ref 4.6–6.5)

## 2020-11-20 LAB — TSH: TSH: 2.94 u[IU]/mL (ref 0.35–4.50)

## 2020-11-20 NOTE — Progress Notes (Signed)
Subjective:   Stacey Navarro is a 80 y.o. female who presents for Medicare Annual (Subsequent) preventive examination.  Review of Systems    No ROS. Medicare Wellness Visit. Additional risk factors are reflected in social history. Cardiac Risk Factors include: advanced age (>61mn, >>26women);dyslipidemia;obesity (BMI >30kg/m2)     Objective:    Today's Vitals   11/20/20 1357  BP: 120/70  Pulse: 74  Temp: 97.6 F (36.4 C)  SpO2: 96%  Weight: 215 lb 3.2 oz (97.6 kg)  Height: 5' 6"  (1.676 m)  PainSc: 0-No pain   Body mass index is 34.73 kg/m.  Advanced Directives 11/20/2020 06/23/2016 03/16/2016 12/10/2015 07/12/2015 07/01/2015 05/03/2015  Does Patient Have a Medical Advance Directive? No Yes Yes No Yes Yes Yes  Type of Advance Directive - -Public librarianLiving will - HGood HopeLiving will [place]iple[place]Living will -  Does patient want to make changes to medical advance directive? - - No - Patient declined - No - Patient declined - -  Copy of [place] Chart? - - Yes - Yes Yes No - copy requested  Would patient like information on creating a medical advance directive? No - Patient declined - - - - - -    Current Medications (verified) Outpatient Encounter Medications as of 11/20/2020  Medication Sig  . anastrozole (ARIMIDEX) 1 MG tablet Take 1 tablet (1 mg total) by mouth daily.  . cholecalciferol (VITAMIN D) 1000 UNITS tablet Take 1,000 Units by mouth daily.  . cycloSPORINE (RESTASIS) 0.05 % ophthalmic emulsion Place 1 drop into both eyes 2 (two) times daily.  .Marland KitchenELIQUIS 2.5 MG TABS tablet Take 1 tablet by mouth twice daily  . hyaluronate sodium (RADIAPLEXRX) GEL Apply 1 application topically 3 (three) times daily.  . Omega-3 Fatty Acids (FISH OIL) 1000 MG CAPS Take 1 capsule by mouth daily.  . pantoprazole (PROTONIX) 40 MG tablet TAKE 1 TABLET BY MOUTH ONCE DAILY AS NEEDED  . Probiotic Product  (PROBIOTIC-10 PO) Take 1 capsule by mouth daily.  .Marland Kitchenalendronate (FOSAMAX) 70 MG tablet Take 1 tablet (70 mg total) by mouth once a week. Take with a full glass of water on an empty stomach.  . nystatin (MYCOSTATIN/NYSTOP) powder Apply 1 application topically 3 (three) times daily.   No facility-administered encounter medications on file as of 11/20/2020.    Allergies (verified) Xarelto [rivaroxaban]   History: Past Medical History:  Diagnosis Date  . Arthritis 2010   OA in knees  . Breast cancer ([place]   right  . Breast cancer of upper-inner quadrant of right female breast ([place] 10/23/2014  . Carpal tunnel syndrome 02/12/2015   Right  . GERD (gastroesophageal reflux disease)   . Hyperlipidemia   . Personal history of radiation therapy   . Primary localized osteoarthritis of left knee 07/12/2015  . Primary localized osteoarthritis of right knee 05/03/2015  . S/P radiation therapy 12/26/2014 through 01/29/2015    Right breast 4800 cGy in 24 sessions    Past Surgical History:  Procedure Laterality Date  . BREAST LUMPECTOMY Right 11/13/2014  . CHOLECYSTECTOMY  2011  . DILATION AND CURETTAGE OF UTERUS    . PARTIAL KNEE ARTHROPLASTY Right 05/03/2015   Procedure: RIGHT UNICOMPARTMENTAL KNEE;  Surgeon: JMarchia Bond MD;  [facility] [facility]  Service: Orthopedics;  Laterality: Right;  . PARTIAL KNEE ARTHROPLASTY Left 07/12/2015   Procedure: LEFT UNICOMPARTMENTAL KNEE;  Surgeon: JMarchia Bond MD;  Location:  [facility];  Service: Orthopedics;  Laterality: Left;  . RADIOACTIVE SEED GUIDED PARTIAL MASTECTOMY WITH AXILLARY SENTINEL LYMPH NODE BIOPSY Right 11/13/2014   Procedure: RIGHT PARTIAL MASTECTOMY WITH RADIOACTIVE SEED LOCALIZATION RIGHT  AXILLARY SENTINEL LYMPH NODE BIOPSY;  Surgeon: [name], MD;  Location: [facility];  Service: General;  Laterality: Right;  .  TONSILLECTOMY  1948  . TUBAL LIGATION     Family History  Problem Relation Age of Onset  . Hypertension Mother   . Alcohol abuse Father   . Breast cancer Maternal Aunt    Social History   Socioeconomic History  . Marital status: Widowed    Spouse name: Not on file  . Number of children: Not on file  . Years of education: Not on file  . Highest education level: Not on file  Occupational History  . Not on file  Tobacco Use  . Smoking status: Former Smoker    Packs/day: 1.00    Types: Cigarettes    Quit date: 11/06/1995    Years since quitting: 25.0  . Smokeless tobacco: Never Used  Substance and Sexual Activity  . Alcohol use: Yes    Alcohol/week: 2.0 standard drinks    Types: 2 Glasses of wine per week    Comment: occ  . Drug use: No  . Sexual activity: Not Currently    Birth control/protection: Post-menopausal  Other Topics Concern  . Not on file  Social History Narrative  . Not on file   Social Determinants of Health   Financial Resource Strain: Low Risk   . Difficulty of Paying Living Expenses: Not hard at all  Food Insecurity: No Food Insecurity  . Worried About Charity fundraiser in the Last Year: Never true  . Ran Out of Food in the Last Year: Never true  Transportation Needs: No Transportation Needs  . Lack of Transportation (Medical): No  . Lack of Transportation (Non-Medical): No  Physical Activity: Insufficiently Active  . Days of Exercise per Week: 3 days  . Minutes of Exercise per Session: 30 min  Stress: No Stress Concern Present  . Feeling of Stress : Not at all  Social Connections: Moderately Integrated  . Frequency of Communication with Friends and Family: More than three times a week  . Frequency of Social Gatherings with Friends and Family: Once a week  . Attends Religious Services: 1 to 4 times per year  . Active Member of Clubs or Organizations: No  . Attends Archivist Meetings: 1 to 4 times per year  . Marital Status: Never  married    Tobacco Counseling Counseling given: Not Answered   Clinical Intake:  Pre-visit preparation completed: Yes  Pain : No/denies pain Pain Score: 0-No pain     BMI - recorded: 34.73 Nutritional Status: BMI > 30  Obese Diabetes: No  How often do you need to have someone help you when you read instructions, pamphlets, or other written materials from your doctor or pharmacy?: 1 - Never What is the last grade level you completed in school?: HSG; 2 years of college  Diabetic? no  Interpreter Needed?: No  Information entered by :: Lisette Abu, LPN.   Activities of Daily Living In your present state of health, do you have any difficulty performing the following activities: 11/20/2020  Hearing? N  Vision? N  Difficulty concentrating or making decisions? N  Walking or climbing stairs? N  Dressing or bathing? N  Doing errands, shopping? N  Preparing  Food and eating ? N  Using the Toilet? N  In the past six months, have you accidently leaked urine? N  Do you have problems with loss of bowel control? N  Managing your Medications? N  Managing your Finances? N  Housekeeping or managing your Housekeeping? N  Some recent data might be hidden    Patient Care Team: [name], MD as PCP - General (Internal Medicine) [name], MD as Consulting Physician (General Surgery) [name], MD as Consulting Physician (Hematology and Oncology) [name], MD (Inactive) as Consulting Physician (Radiation Oncology) Mauro Kaufmann, RN as Registered Nurse Rockwell Germany, RN as Registered Nurse Holley Bouche, NP (Inactive) as Nurse Practitioner (Nurse Practitioner) Marchia Bond, MD as Consulting Physician (Orthopedic Surgery) Sylvan Cheese, NP as Nurse Practitioner (Hematology and Oncology) Charlton Haws, [facility] as Pharmacist (Pharmacist)  Indicate any recent Medical Services you may have received from other than [facility] providers in the  past year (date may be approximate).     Assessment:   This is a routine wellness examination for [facility].  Hearing/Vision screen No exam data present  Dietary issues and exercise activities discussed: Current Exercise Habits: Home exercise routine, Type of exercise: walking, Time (Minutes): 30, Frequency (Times/Week): 3, Weekly Exercise (Minutes/Week): 90, Intensity: Moderate, Exercise limited by: orthopedic condition(s)  Goals    . Pharmacy Care Plan      Depression Screen [facility] 2/9 Scores 11/20/2020 01/30/2019 07/21/2017 03/16/2016 03/12/2015 02/19/2015 01/14/2015  PHQ - 2 Score 0 0 0 0 0 0 0  Exception Documentation - - - - - Other- indicate reason in comment box -    Fall Risk Fall Risk  11/20/2020 01/30/2019 07/21/2017 03/16/2016 03/12/2015  Falls in the past year? 0 0 No No No  Number falls in past yr: 0 0 - - -  Injury with Fall? 0 0 - - -  Risk for fall due to : No Fall Risks - - - -  Follow up Falls evaluation completed Falls evaluation completed - - -    FALL RISK PREVENTION PERTAINING TO THE HOME:  Any stairs in or around the home? No  If so, are there any without handrails? No  Home free of loose throw rugs in walkways, pet beds, electrical cords, etc? Yes  Adequate lighting in your home to reduce risk of falls? Yes   ASSISTIVE DEVICES UTILIZED TO PREVENT FALLS:  Life alert? No  Use of a cane, walker or w/c? No  Grab bars in the bathroom? No  Shower chair or bench in shower? No  Elevated toilet seat or a handicapped toilet? No   TIMED UP AND GO:  Was the test performed? No .  Length of time to ambulate 10 feet: 0 sec.   Gait steady and fast without use of assistive device  Cognitive Function: Normal cognitive status assessed by direct observation by this Nurse Health Advisor. No abnormalities found.          Immunizations Immunization History  Administered Date(s) Administered  . PFIZER(Purple Top)SARS-COV-2 Vaccination 09/02/2019, 09/27/2019  .  Pneumococcal Conjugate-13 10/03/2014  . Pneumococcal Polysaccharide-23 03/16/2016  . Tdap 10/03/2014  . Zoster 03/16/2016  . Zoster Recombinat (Shingrix) 01/19/2018, 06/17/2018    TDAP status: Up to date  Flu Vaccine status: Declined, Education has been provided regarding the importance of this vaccine but patient still declined. Advised may receive this vaccine at local pharmacy or Health Dept. Aware to provide a copy of the vaccination record if  obtained from local pharmacy or Health Dept. Verbalized acceptance and understanding.  Pneumococcal vaccine status: Up to date  Covid-19 vaccine status: Completed vaccines  Qualifies for Shingles Vaccine? Yes   Zostavax completed Yes   Shingrix Completed?: Yes  Screening Tests Health Maintenance  Topic Date Due  . COVID-19 Vaccine (3 - Pfizer risk 4-dose series) 10/25/2019  . INFLUENZA VACCINE  02/24/2021  . TETANUS/TDAP  10/02/2024  . DEXA SCAN  Completed  . Hepatitis C Screening  Completed  . PNA vac Low Risk Adult  Completed  . HPV VACCINES  Aged Out    Health Maintenance  Health Maintenance Due  Topic Date Due  . COVID-19 Vaccine (3 - Pfizer risk 4-dose series) 10/25/2019    Colorectal cancer screening: No longer required.   Mammogram status: Completed 12/07/2019. Repeat every year  Bone Density status: Completed 03/10/2019. Results reflect: Bone density results: OSTEOPOROSIS. Repeat every 0 years. (no longer recommended)  Lung Cancer Screening: (Low Dose CT Chest recommended if Age 13-80 years, 30 pack-year currently smoking OR have quit w/in 15years.) does not qualify.   Lung Cancer Screening Referral: no  Additional Screening:  Hepatitis C Screening: does qualify; Completed yes  Vision Screening: Recommended annual ophthalmology exams for early detection of glaucoma and other disorders of the eye. Is the patient up to date with their annual eye exam?  Yes  Who is the provider or what is the name of the office in  which the patient attends annual eye exams? [facility] If pt is not established with a provider, would they like to be referred to a provider to establish care? No .   Dental Screening: Recommended annual dental exams for proper oral hygiene  Community Resource Referral / Chronic Care Management: CRR required this visit?  No   CCM required this visit?  No      Plan:     I have personally reviewed and noted the following in the patient's chart:   . Medical and social history . Use of alcohol, tobacco or illicit drugs  . Current medications and supplements . Functional ability and status . Nutritional status . Physical activity . Advanced directives . List of other physicians . Hospitalizations, surgeries, and ER visits in previous 12 months . Vitals . Screenings to include cognitive, depression, and falls . Referrals and appointments  In addition, I have reviewed and discussed with patient certain preventive protocols, quality metrics, and best practice recommendations. A written personalized care plan for preventive services as well as general preventive health recommendations were provided to patient.     Sheral Flow, LPN   02/26/2121   Nurse Notes:  Medications reviewed with patient; no opioid use noted.

## 2020-11-20 NOTE — Progress Notes (Signed)
Subjective:  Patient ID: Stacey Navarro, female    DOB: 10-17-1940  Age: 80 y.o. MRN: [id number]  CC: Annual Exam and Hyperlipidemia  This visit occurred during the SARS-CoV-2 public health emergency.  Safety protocols were in place, including screening questions prior to the visit, additional usage of staff PPE, and extensive cleaning of exam room while observing appropriate contact time as indicated for disinfecting solutions.    HPI Stacey Navarro presents for a CPX and f/up -   She is very active and denies any recent episodes of chest pain, shortness of breath, palpitations, edema, or fatigue.  Outpatient Medications Prior to Visit  Medication Sig Dispense Refill  . alendronate (FOSAMAX) 70 MG tablet Take 1 tablet (70 mg total) by mouth once a week. Take with a full glass of water on an empty stomach. 12 tablet 3  . anastrozole (ARIMIDEX) 1 MG tablet Take 1 tablet (1 mg total) by mouth daily. 90 tablet 3  . cholecalciferol (VITAMIN D) 1000 UNITS tablet Take 1,000 Units by mouth daily.    . cycloSPORINE (RESTASIS) 0.05 % ophthalmic emulsion Place 1 drop into both eyes 2 (two) times daily.    Marland Kitchen ELIQUIS 2.5 MG TABS tablet Take 1 tablet by mouth twice daily 180 tablet 0  . hyaluronate sodium (RADIAPLEXRX) GEL Apply 1 application topically 3 (three) times daily. 170 g 0  . nystatin (MYCOSTATIN/NYSTOP) powder Apply 1 application topically 3 (three) times daily. 15 g 0  . Omega-3 Fatty Acids (FISH OIL) 1000 MG CAPS Take 1 capsule by mouth daily.    . pantoprazole (PROTONIX) 40 MG tablet TAKE 1 TABLET BY MOUTH ONCE DAILY AS NEEDED 90 tablet 0  . Probiotic Product (PROBIOTIC-10 PO) Take 1 capsule by mouth daily.     No facility-administered medications prior to visit.    ROS Review of Systems  Constitutional: Negative for appetite change, diaphoresis, fatigue and unexpected weight change.  HENT: Negative.  Negative for trouble swallowing.   Eyes: Negative.   Respiratory: Negative  for cough, chest tightness, shortness of breath and wheezing.   Cardiovascular: Negative for chest pain, palpitations and leg swelling.  Gastrointestinal: Negative for abdominal pain and blood in stool.  Endocrine: Negative.   Genitourinary: Negative.  Negative for difficulty urinating and dysuria.  Musculoskeletal: Negative.  Negative for myalgias.  Skin: Negative for color change.  Neurological: Negative for dizziness, weakness and light-headedness.  Hematological: Negative for adenopathy. Does not bruise/bleed easily.  Psychiatric/Behavioral: Negative.     Objective:  BP 120/70   Pulse 74   Temp 97.6 F (36.4 C) (Oral)   Ht 5' 6"  (1.676 m)   Wt 215 lb (97.5 kg)   SpO2 96%   BMI 34.70 kg/m   BP Readings from Last 3 Encounters:  11/20/20 120/70  11/20/20 120/70  12/08/19 126/60    Wt Readings from Last 3 Encounters:  11/20/20 215 lb (97.5 kg)  11/20/20 215 lb 3.2 oz (97.6 kg)  12/08/19 200 lb (90.7 kg)    Physical Exam Vitals reviewed.  HENT:     Nose: Nose normal.     Mouth/Throat:     Mouth: Mucous membranes are moist.  Eyes:     General: No scleral icterus.    Conjunctiva/sclera: Conjunctivae normal.  Cardiovascular:     Rate and Rhythm: Normal rate and regular rhythm.     Pulses: Normal pulses.     Heart sounds: No murmur heard.   Pulmonary:     Effort: Pulmonary effort  is normal.     Breath sounds: No stridor. No wheezing, rhonchi or rales.  Abdominal:     General: Abdomen is flat.     Palpations: There is no mass.     Tenderness: There is no abdominal tenderness. There is no guarding.  Musculoskeletal:        General: Normal range of motion.     Cervical back: Neck supple.     Right lower leg: No edema.     Left lower leg: No edema.  Lymphadenopathy:     Cervical: No cervical adenopathy.  Skin:    General: Skin is warm and dry.     Coloration: Skin is not pale.  Neurological:     General: No focal deficit present.     Mental Status: She is  alert.  Psychiatric:        Mood and Affect: Mood normal.        Behavior: Behavior normal.     Lab Results  Component Value Date   WBC 7.5 11/20/2020   HGB 15.4 (H) 11/20/2020   HCT 45.5 11/20/2020   PLT 242.0 11/20/2020   GLUCOSE 100 (H) 11/20/2020   CHOL 247 (H) 11/20/2020   TRIG 100.0 11/20/2020   HDL 64.20 11/20/2020   LDLDIRECT 158.4 07/17/2011   LDLCALC 163 (H) 11/20/2020   ALT 54 (H) 11/20/2020   AST 43 (H) 11/20/2020   NA 140 11/20/2020   K 4.3 11/20/2020   CL 103 11/20/2020   CREATININE 0.89 11/20/2020   BUN 21 11/20/2020   CO2 29 11/20/2020   TSH 2.94 11/20/2020   HGBA1C 5.8 11/20/2020    MM DIAG BREAST TOMO BILATERAL  Result Date: 12/07/2019 CLINICAL DATA:  80 year old female presenting for annual surveillance. History of right breast cancer in 2016 status post lumpectomy and radiation. No problems today. EXAM: DIGITAL DIAGNOSTIC BILATERAL MAMMOGRAM WITH CAD AND TOMO COMPARISON:  Previous exam(s). ACR Breast Density Category b: There are scattered areas of fibroglandular density. FINDINGS: Right breast: There are expected postsurgical changes including fat necrosis at the lumpectomy site. No suspicious mass, distortion, or microcalcifications are identified to suggest presence of malignancy. Left breast: No suspicious mass, distortion, or microcalcifications are identified to suggest presence of malignancy. Mammographic images were processed with CAD. IMPRESSION: No mammographic evidence of malignancy in the bilateral breasts. RECOMMENDATION: Screening mammogram in one year.(Code:SM-B-01Y) I have discussed the findings and recommendations with the patient. If applicable, a reminder letter will be sent to the patient regarding the next appointment. BI-RADS CATEGORY  2: Benign. Electronically Signed   By: Audie Pinto M.D.   On: 12/07/2019 10:36    Assessment & Plan:   [name] was seen today for annual exam and hyperlipidemia.  Diagnoses and all orders for this  visit:  Hypercoagulable state, primary [facility])- She is doing well on the [place] with no recurrent thrombotic events.  Will continue. -     CBC with Differential/Platelet; Future -     CBC with Differential/Platelet  Hyperlipidemia with target LDL less than 130- Her ASCVD risk score is 20% so I recommended that she take a statin for cardiovascular risk reduction. -     Lipid panel; Future -     Hepatic function panel; Future -     TSH; Future -     TSH -     Hepatic function panel -     Lipid panel -     rosuvastatin (CRESTOR) 20 MG tablet; Take 1 tablet (20 mg total) by  mouth daily.  Prediabetes- Her A1c is normal at 5.8%. -     Basic metabolic panel; Future -     Hemoglobin A1c; Future -     Hemoglobin A1c -     Basic metabolic panel  Routine general medical examination at a health care facility- Exam completed, labs reviewed, vaccines reviewed and updated, no cancer screenings are indicated, patient education was given.  Elevated LFTs- I will order an ultrasound to see if she has NASH. -     US Abdomen Limited RUQ (LIVER/GB); Future   I am having Stacey Navarro start on rosuvastatin. I am also having her maintain her cholecalciferol, cycloSPORINE, hyaluronate sodium, Fish Oil, Probiotic Product (PROBIOTIC-10 PO), alendronate, nystatin, anastrozole, Eliquis, and pantoprazole.  Meds ordered this encounter  Medications  . rosuvastatin (CRESTOR) 20 MG tablet    Sig: Take 1 tablet (20 mg total) by mouth daily.    Dispense:  90 tablet    Refill:  1     Follow-up: Return in about 6 months (around 05/22/2021).  [name], MD

## 2020-11-20 NOTE — Patient Instructions (Signed)
Stacey Navarro , Thank you for taking time to come for your Medicare Wellness Visit. I appreciate your ongoing commitment to your health goals. Please review the following plan we discussed and let me know if I can assist you in the future.   Screening recommendations/referrals: Colonoscopy: last done 03/01/2018; no repeat due to age 80: 12/07/2019; due every 1-2 years Bone Density: 03/10/2019; no longer recommended Recommended yearly ophthalmology/optometry visit for glaucoma screening and checkup Recommended yearly dental visit for hygiene and checkup  Vaccinations: Influenza vaccine: declined Pneumococcal vaccine: 10/03/2014, 03/16/2016 Tdap vaccine: 10/03/2014; due every 10 years Shingles vaccine: 01/19/2018, 06/17/2018   Covid-19: 09/02/2019, 09/27/2019, 05/02/2020  Advanced directives: Advance directive discussed with you today. Even though you declined this today please call our office should you change your mind and we can give you the proper paperwork for you to fill out.  Conditions/risks identified: Yes; Reviewed health maintenance screenings with patient today and relevant education, vaccines, and/or referrals were provided. Please continue to do your personal lifestyle choices by: daily care of teeth and gums, regular physical activity (goal should be 5 days a week for 30 minutes), eat a healthy diet, avoid tobacco and drug use, limiting any alcohol intake, taking a low-dose aspirin (if not allergic or have been advised by your provider otherwise) and taking vitamins and minerals as recommended by your provider. Continue doing brain stimulating activities (puzzles, reading, adult coloring books, staying active) to keep memory sharp. Continue to eat heart healthy diet (full of fruits, vegetables, whole grains, lean protein, water--limit salt, fat, and sugar intake) and increase physical activity as tolerated.  Next appointment: Please schedule your next Medicare Wellness Visit with your Nurse  Health Advisor in 1 year by calling [phone].  Preventive Care 85 Years and Older, Female Preventive care refers to lifestyle choices and visits with your health care provider that can promote health and wellness. What does preventive care include?  A yearly physical exam. This is also called an annual well check.  Dental exams once or twice a year.  Routine eye exams. Ask your health care provider how often you should have your eyes checked.  Personal lifestyle choices, including:  Daily care of your teeth and gums.  Regular physical activity.  Eating a healthy diet.  Avoiding tobacco and drug use.  Limiting alcohol use.  Practicing safe sex.  Taking low-dose aspirin every day.  Taking vitamin and mineral supplements as recommended by your health care provider. What happens during an annual well check? The services and screenings done by your health care provider during your annual well check will depend on your age, overall health, lifestyle risk factors, and family history of disease. Counseling  Your health care provider may ask you questions about your:  Alcohol use.  Tobacco use.  Drug use.  Emotional well-being.  Home and relationship well-being.  Sexual activity.  Eating habits.  History of falls.  Memory and ability to understand (cognition).  Work and work Statistician.  Reproductive health. Screening  You may have the following tests or measurements:  Height, weight, and BMI.  Blood pressure.  Lipid and cholesterol levels. These may be checked every 5 years, or more frequently if you are over 62 years old.  Skin check.  Lung cancer screening. You may have this screening every year starting at age 2 if you have a 30-pack-year history of smoking and currently smoke or have quit within the past 15 years.  Fecal occult blood test (FOBT) of the stool. You  may have this test every year starting at age 70.  Flexible sigmoidoscopy or  colonoscopy. You may have a sigmoidoscopy every 5 years or a colonoscopy every 10 years starting at age 78.  Hepatitis C blood test.  Hepatitis B blood test.  Sexually transmitted disease (STD) testing.  Diabetes screening. This is done by checking your blood sugar (glucose) after you have not eaten for a while (fasting). You may have this done every 1-3 years.  Bone density scan. This is done to screen for osteoporosis. You may have this done starting at age 71.  Mammogram. This may be done every 1-2 years. Talk to your health care provider about how often you should have regular mammograms. Talk with your health care provider about your test results, treatment options, and if necessary, the need for more tests. Vaccines  Your health care provider may recommend certain vaccines, such as:  Influenza vaccine. This is recommended every year.  Tetanus, diphtheria, and acellular pertussis (Tdap, Td) vaccine. You may need a Td booster every 10 years.  Zoster vaccine. You may need this after age 64.  Pneumococcal 13-valent conjugate (PCV13) vaccine. One dose is recommended after age 24.  Pneumococcal polysaccharide (PPSV23) vaccine. One dose is recommended after age 67. Talk to your health care provider about which screenings and vaccines you need and how often you need them. This information is not intended to replace advice given to you by your health care provider. Make sure you discuss any questions you have with your health care provider. Document Released: 08/09/2015 Document Revised: 04/01/2016 Document Reviewed: 05/14/2015 Elsevier Interactive Patient Education  2017 [employer] Prevention in the Home Falls can cause injuries. They can happen to people of all ages. There are many things you can do to make your home safe and to help prevent falls. What can I do on the outside of my home?  Regularly fix the edges of walkways and driveways and fix any cracks.  Remove  anything that might make you trip as you walk through a door, such as a raised step or threshold.  Trim any bushes or trees on the path to your home.  Use bright outdoor lighting.  Clear any walking paths of anything that might make someone trip, such as rocks or tools.  Regularly check to see if handrails are loose or broken. Make sure that both sides of any steps have handrails.  Any raised decks and porches should have guardrails on the edges.  Have any leaves, snow, or ice cleared regularly.  Use sand or salt on walking paths during winter.  Clean up any spills in your garage right away. This includes oil or grease spills. What can I do in the bathroom?  Use night lights.  Install grab bars by the toilet and in the tub and shower. Do not use towel bars as grab bars.  Use non-skid mats or decals in the tub or shower.  If you need to sit down in the shower, use a plastic, non-slip stool.  Keep the floor dry. Clean up any water that spills on the floor as soon as it happens.  Remove soap buildup in the tub or shower regularly.  Attach bath mats securely with double-sided non-slip rug tape.  Do not have throw rugs and other things on the floor that can make you trip. What can I do in the bedroom?  Use night lights.  Make sure that you have a light by your bed that is  easy to reach.  Do not use any sheets or blankets that are too big for your bed. They should not hang down onto the floor.  Have a firm chair that has side arms. You can use this for support while you get dressed.  Do not have throw rugs and other things on the floor that can make you trip. What can I do in the kitchen?  Clean up any spills right away.  Avoid walking on wet floors.  Keep items that you use a lot in easy-to-reach places.  If you need to reach something above you, use a strong step stool that has a grab bar.  Keep electrical cords out of the way.  Do not use floor polish or wax that  makes floors slippery. If you must use wax, use non-skid floor wax.  Do not have throw rugs and other things on the floor that can make you trip. What can I do with my stairs?  Do not leave any items on the stairs.  Make sure that there are handrails on both sides of the stairs and use them. Fix handrails that are broken or loose. Make sure that handrails are as long as the stairways.  Check any carpeting to make sure that it is firmly attached to the stairs. Fix any carpet that is loose or worn.  Avoid having throw rugs at the top or bottom of the stairs. If you do have throw rugs, attach them to the floor with carpet tape.  Make sure that you have a light switch at the top of the stairs and the bottom of the stairs. If you do not have them, ask someone to add them for you. What else can I do to help prevent falls?  Wear shoes that:  Do not have high heels.  Have rubber bottoms.  Are comfortable and fit you well.  Are closed at the toe. Do not wear sandals.  If you use a stepladder:  Make sure that it is fully opened. Do not climb a closed stepladder.  Make sure that both sides of the stepladder are locked into place.  Ask someone to hold it for you, if possible.  Clearly mark and make sure that you can see:  Any grab bars or handrails.  First and last steps.  Where the edge of each step is.  Use tools that help you move around (mobility aids) if they are needed. These include:  Canes.  Walkers.  Scooters.  Crutches.  Turn on the lights when you go into a dark area. Replace any light bulbs as soon as they burn out.  Set up your furniture so you have a clear path. Avoid moving your furniture around.  If any of your floors are uneven, fix them.  If there are any pets around you, be aware of where they are.  Review your medicines with your doctor. Some medicines can make you feel dizzy. This can increase your chance of falling. Ask your doctor what other  things that you can do to help prevent falls. This information is not intended to replace advice given to you by your health care provider. Make sure you discuss any questions you have with your health care provider. Document Released: 05/09/2009 Document Revised: 12/19/2015 Document Reviewed: 08/17/2014 Elsevier Interactive Patient Education  2017 [employer].

## 2020-11-20 NOTE — Patient Instructions (Signed)
Health Maintenance, Female Adopting a healthy lifestyle and getting preventive care are important in promoting health and wellness. Ask your health care provider about:  The right schedule for you to have regular tests and exams.  Things you can do on your own to prevent diseases and keep yourself healthy. What should I know about diet, weight, and exercise? Eat a healthy diet  Eat a diet that includes plenty of vegetables, fruits, low-fat dairy products, and lean protein.  Do not eat a lot of foods that are high in solid fats, added sugars, or sodium.   Maintain a healthy weight Body mass index (BMI) is used to identify weight problems. It estimates body fat based on height and weight. Your health care provider can help determine your BMI and help you achieve or maintain a healthy weight. Get regular exercise Get regular exercise. This is one of the most important things you can do for your health. Most adults should:  Exercise for at least 150 minutes each week. The exercise should increase your heart rate and make you sweat (moderate-intensity exercise).  Do strengthening exercises at least twice a week. This is in addition to the moderate-intensity exercise.  Spend less time sitting. Even light physical activity can be beneficial. Watch cholesterol and blood lipids Have your blood tested for lipids and cholesterol at 78 years of age, then have this test every 5 years. Have your cholesterol levels checked more often if:  Your lipid or cholesterol levels are high.  You are older than 78 years of age.  You are at high risk for heart disease. What should I know about cancer screening? Depending on your health history and family history, you may need to have cancer screening at various ages. This may include screening for:  Breast cancer.  Cervical cancer.  Colorectal cancer.  Skin cancer.  Lung cancer. What should I know about heart disease, diabetes, and high blood  pressure? Blood pressure and heart disease  High blood pressure causes heart disease and increases the risk of stroke. This is more likely to develop in people who have high blood pressure readings, are of African descent, or are overweight.  Have your blood pressure checked: ? Every 3-5 years if you are 18-39 years of age. ? Every year if you are 40 years old or older. Diabetes Have regular diabetes screenings. This checks your fasting blood sugar level. Have the screening done:  Once every three years after age 40 if you are at a normal weight and have a low risk for diabetes.  More often and at a younger age if you are overweight or have a high risk for diabetes. What should I know about preventing infection? Hepatitis B If you have a higher risk for hepatitis B, you should be screened for this virus. Talk with your health care provider to find out if you are at risk for hepatitis B infection. Hepatitis C Testing is recommended for:  Everyone born from 1945 through 1965.  Anyone with known risk factors for hepatitis C. Sexually transmitted infections (STIs)  Get screened for STIs, including gonorrhea and chlamydia, if: ? You are sexually active and are younger than 78 years of age. ? You are older than 78 years of age and your health care provider tells you that you are at risk for this type of infection. ? Your sexual activity has changed since you were last screened, and you are at increased risk for chlamydia or gonorrhea. Ask your health care provider   if you are at risk.  Ask your health care provider about whether you are at high risk for HIV. Your health care provider may recommend a prescription medicine to help prevent HIV infection. If you choose to take medicine to prevent HIV, you should first get tested for HIV. You should then be tested every 3 months for as long as you are taking the medicine. Pregnancy  If you are about to stop having your period (premenopausal) and  you may become pregnant, seek counseling before you get pregnant.  Take 400 to 800 micrograms (mcg) of folic acid every day if you become pregnant.  Ask for birth control (contraception) if you want to prevent pregnancy. Osteoporosis and menopause Osteoporosis is a disease in which the bones lose minerals and strength with aging. This can result in bone fractures. If you are 65 years old or older, or if you are at risk for osteoporosis and fractures, ask your health care provider if you should:  Be screened for bone loss.  Take a calcium or vitamin D supplement to lower your risk of fractures.  Be given hormone replacement therapy (HRT) to treat symptoms of menopause. Follow these instructions at home: Lifestyle  Do not use any products that contain nicotine or tobacco, such as cigarettes, e-cigarettes, and chewing tobacco. If you need help quitting, ask your health care provider.  Do not use street drugs.  Do not share needles.  Ask your health care provider for help if you need support or information about quitting drugs. Alcohol use  Do not drink alcohol if: ? Your health care provider tells you not to drink. ? You are pregnant, may be pregnant, or are planning to become pregnant.  If you drink alcohol: ? Limit how much you use to 0-1 drink a day. ? Limit intake if you are breastfeeding.  Be aware of how much alcohol is in your drink. In the [place] one drink equals one 12 oz bottle of beer (355 mL), one 5 oz glass of wine (148 mL), or one 1 oz glass of hard liquor (44 mL). General instructions  Schedule regular health, dental, and eye exams.  Stay current with your vaccines.  Tell your health care provider if: ? You often feel depressed. ? You have ever been abused or do not feel safe at home. Summary  Adopting a healthy lifestyle and getting preventive care are important in promoting health and wellness.  Follow your health care provider's instructions about healthy  diet, exercising, and getting tested or screened for diseases.  Follow your health care provider's instructions on monitoring your cholesterol and blood pressure. This information is not intended to replace advice given to you by your health care provider. Make sure you discuss any questions you have with your health care provider. Document Revised: 07/06/2018 Document Reviewed: 07/06/2018 Elsevier Patient Education  2021 Elsevier Inc.  

## 2020-11-21 ENCOUNTER — Telehealth: Payer: Self-pay | Admitting: Internal Medicine

## 2020-11-21 MED ORDER — ROSUVASTATIN CALCIUM 20 MG PO TABS: 20.0000 mg | ORAL_TABLET | Freq: Every day | ORAL | 1 refills | Status: DC

## 2020-11-21 NOTE — Telephone Encounter (Signed)
Patient calling, she got a notification that she has some test results and cannot access mychart, requesting a call back

## 2020-11-21 NOTE — Telephone Encounter (Signed)
Pt has been informed of results and expressed understanding.

## 2020-11-25 ENCOUNTER — Other Ambulatory Visit: Payer: Self-pay | Admitting: Hematology and Oncology

## 2020-11-27 ENCOUNTER — Ambulatory Visit
Admission: RE | Admit: 2020-11-27 | Discharge: 2020-11-27 | Disposition: A | Discharge status: Home / Self Care | Payer: Medicare Other | Source: Ambulatory Visit | Attending: Internal Medicine | Admitting: Internal Medicine

## 2020-11-27 DIAGNOSIS — K760 Fatty (change of) liver, not elsewhere classified: Secondary | ICD-10-CM | POA: Diagnosis not present

## 2020-11-27 DIAGNOSIS — R7989 Other specified abnormal findings of blood chemistry: Secondary | ICD-10-CM

## 2020-12-04 ENCOUNTER — Telehealth: Payer: Self-pay | Admitting: Hematology and Oncology

## 2020-12-04 NOTE — Telephone Encounter (Signed)
Cancelled appt per 5/11 sch msg. Pt declined to r/s at this time.

## 2020-12-06 ENCOUNTER — Inpatient Hospital Stay: Payer: Medicare Other | Admitting: Hematology and Oncology

## 2020-12-19 ENCOUNTER — Telehealth: Payer: Self-pay | Admitting: Pharmacist

## 2021-01-06 ENCOUNTER — Other Ambulatory Visit: Payer: Self-pay | Admitting: Hematology and Oncology

## 2021-01-06 DIAGNOSIS — Z1231 Encounter for screening mammogram for malignant neoplasm of breast: Secondary | ICD-10-CM

## 2021-01-16 NOTE — Progress Notes (Signed)
    Chronic Care Management Pharmacy Assistant   Name: Stacey Navarro  MRN: [id number] DOB: 1941/02/02   Reason for Encounter: Disease State   Conditions to be addressed/monitored: General Call   Medications: Outpatient Encounter Medications as of 12/19/2020  Medication Sig   alendronate (FOSAMAX) 70 MG tablet Take 1 tablet (70 mg total) by mouth once a week. Take with a full glass of water on an empty stomach.   anastrozole (ARIMIDEX) 1 MG tablet Take 1 tablet (1 mg total) by mouth daily.   cholecalciferol (VITAMIN D) 1000 UNITS tablet Take 1,000 Units by mouth daily.   cycloSPORINE (RESTASIS) 0.05 % ophthalmic emulsion Place 1 drop into both eyes 2 (two) times daily.   ELIQUIS 2.5 MG TABS tablet Take 1 tablet by mouth twice daily   hyaluronate sodium (RADIAPLEXRX) GEL Apply 1 application topically 3 (three) times daily.   NYSTATIN powder APPLY 1 APPLICATION TOPICALLY 3 TIMES DAILY   Omega-3 Fatty Acids (FISH OIL) 1000 MG CAPS Take 1 capsule by mouth daily.   pantoprazole (PROTONIX) 40 MG tablet TAKE 1 TABLET BY MOUTH ONCE DAILY AS NEEDED   Probiotic Product (PROBIOTIC-10 PO) Take 1 capsule by mouth daily.   rosuvastatin (CRESTOR) 20 MG tablet Take 1 tablet (20 mg total) by mouth daily.   No facility-administered encounter medications on file as of 12/19/2020.    Pharmacist Review  Made 3 attempts to reach patient for a general adherence call to assess health state. Was unable to reach because vm is not set up, so could not leave message.  [place] Pharmacist Assistant [phone]   Time spent:5

## 2021-01-20 ENCOUNTER — Other Ambulatory Visit: Payer: Self-pay | Admitting: Internal Medicine

## 2021-01-20 ENCOUNTER — Other Ambulatory Visit: Payer: Self-pay | Admitting: Hematology and Oncology

## 2021-01-20 DIAGNOSIS — C50211 Malignant neoplasm of upper-inner quadrant of right female breast: Secondary | ICD-10-CM

## 2021-01-21 ENCOUNTER — Telehealth: Payer: Self-pay | Admitting: Hematology and Oncology

## 2021-01-21 NOTE — Telephone Encounter (Signed)
Scheduled appt per 6/28 sch msg. Pt aware.

## 2021-01-30 ENCOUNTER — Telehealth: Payer: Self-pay | Admitting: Pharmacist

## 2021-01-30 NOTE — Progress Notes (Signed)
    Chronic Care Management Pharmacy Assistant   Name: Stacey Navarro  MRN: [id number] DOB: [birth year]  Reason for Encounter: Disease State - General Adherence    Recent office visits:  None noted.  Recent consult visits:  None noted.  Hospital visits:  None in previous 6 months  Medications: Outpatient Encounter Medications as of 01/30/2021  Medication Sig   alendronate (FOSAMAX) 70 MG tablet Take 1 tablet (70 mg total) by mouth once a week. Take with a full glass of water on an empty stomach.   anastrozole (ARIMIDEX) 1 MG tablet Take 1 tablet by mouth once daily   cholecalciferol (VITAMIN D) 1000 UNITS tablet Take 1,000 Units by mouth daily.   cycloSPORINE (RESTASIS) 0.05 % ophthalmic emulsion Place 1 drop into both eyes 2 (two) times daily.   ELIQUIS 2.5 MG TABS tablet Take 1 tablet by mouth twice daily   hyaluronate sodium (RADIAPLEXRX) GEL Apply 1 application topically 3 (three) times daily.   NYSTATIN powder APPLY 1 APPLICATION TOPICALLY 3 TIMES DAILY   Omega-3 Fatty Acids (FISH OIL) 1000 MG CAPS Take 1 capsule by mouth daily.   pantoprazole (PROTONIX) 40 MG tablet TAKE 1 TABLET BY MOUTH ONCE DAILY AS NEEDED   Probiotic Product (PROBIOTIC-10 PO) Take 1 capsule by mouth daily.   rosuvastatin (CRESTOR) 20 MG tablet Take 1 tablet (20 mg total) by mouth daily.   No facility-administered encounter medications on file as of 01/30/2021.   Have you had any problems recently with your health? Patient states no recent problems.  Have you had any problems with your pharmacy? Patient states no problems with pharmacy.   What issues or side effects are you having with your medications? Patient states no issues or side effects.   What would you like me to pass along to [facility] for them to help you with?  Patient states nothing at this time.   What can we do to take care of you better? Patient states nothing at this time, she is at the beach enjoying her vacation.     Star Rating Drugs: Rosuvastatin 20 mg - last filled 11/21/20 - 90 days  Orinda Kenner, [place] Clinical Pharmacists Assistant [phone]  Time Spent: 74

## 2021-02-01 ENCOUNTER — Other Ambulatory Visit: Payer: Self-pay | Admitting: Internal Medicine

## 2021-02-01 ENCOUNTER — Other Ambulatory Visit: Payer: Self-pay | Admitting: Hematology and Oncology

## 2021-02-01 DIAGNOSIS — C50211 Malignant neoplasm of upper-inner quadrant of right female breast: Secondary | ICD-10-CM

## 2021-02-01 DIAGNOSIS — D6859 Other primary thrombophilia: Secondary | ICD-10-CM

## 2021-02-01 DIAGNOSIS — I82562 Chronic embolism and thrombosis of left calf muscular vein: Secondary | ICD-10-CM

## 2021-02-19 ENCOUNTER — Other Ambulatory Visit: Payer: Self-pay | Admitting: Hematology and Oncology

## 2021-02-19 DIAGNOSIS — C50211 Malignant neoplasm of upper-inner quadrant of right female breast: Secondary | ICD-10-CM

## 2021-03-03 ENCOUNTER — Other Ambulatory Visit: Payer: Self-pay

## 2021-03-03 ENCOUNTER — Ambulatory Visit
Admission: RE | Admit: 2021-03-03 | Discharge: 2021-03-03 | Disposition: A | Discharge status: Home / Self Care | Payer: Medicare Other | Source: Ambulatory Visit | Attending: Hematology and Oncology | Admitting: Hematology and Oncology

## 2021-03-03 DIAGNOSIS — Z1231 Encounter for screening mammogram for malignant neoplasm of breast: Secondary | ICD-10-CM

## 2021-03-22 NOTE — Progress Notes (Signed)
Patient Care Team: [name], MD as PCP - General (Internal Medicine) [name], MD as Consulting Physician (General Surgery) [name], MD as Consulting Physician (Hematology and Oncology) [name], MD (Inactive) as Consulting Physician (Radiation Oncology) Mauro Kaufmann, RN as Registered Nurse Rockwell Germany, RN as Registered Nurse Holley Bouche, NP (Inactive) as Nurse Practitioner (Nurse Practitioner) Marchia Bond, MD as Consulting Physician (Orthopedic Surgery) Sylvan Cheese, NP as Nurse Practitioner (Hematology and Oncology) Charlton Haws, [facility] as Pharmacist (Pharmacist) Stefan Church, OD as Consulting Physician (Optometry)  DIAGNOSIS:    ICD-10-CM   1. Malignant neoplasm of upper-inner quadrant of right breast in female, estrogen receptor positive ([place])  C50.211    Z17.0       SUMMARY OF ONCOLOGIC HISTORY: Oncology History  Breast cancer of upper-inner quadrant of right female breast ([place])  10/19/2014 Mammogram   Right breast mass 3.1 cm upper quadrant, 6 cm from the nipple   10/19/2014 Initial Biopsy   Right breast core needle bx: invasive ductal carcinoma, grade 2-3, ER+ (100%), PR+ (48%), HER-2 negative (ratio 0.6), Ki-67 28%   10/20/2014 Clinical Stage   Stage IIA: T2 N0   11/13/2014 Definitive Surgery   Right Lumpectomy/SLNB Dalbert Batman): IDC, grade 3, 2.7 cm size, 3 LN removed and negative for malignancy (0/3) margins clear. HER2/neu repeated and remains negative (ratio 0.88).   11/13/2014 Pathologic Stage   Stage IIA: pT2 pN0   11/13/2014 Oncotype testing   Score 19 (12% ROR). No chemotherapy Lindi Adie).   12/26/2014 - 01/29/2015 Radiation Therapy   Adjuvant RT Valere Dross): Right breast 48 Gy over 24 sessions   02/14/2015 -  Anti-estrogen oral therapy   Anastrozole 1 mg daily. Planned duration of therapy 5 years [place])    Survivorship   Survivorship care plan completed and mailed to patient in lieu of inperson  visit.     CHIEF COMPLIANT:  Follow-up of right breast cancer on anastrozole therapy  INTERVAL HISTORY: Stacey Navarro is a 80 y.o. with above-mentioned history of  right breast cancer treated with lumpectomy, radiation, and who is currently on anastrozole therapy. Mammogram on 03/03/21 showed no evidence of malignancy bilaterally. She presents today for follow-up.   ALLERGIES:  is allergic to xarelto [rivaroxaban].  MEDICATIONS:  Current Outpatient Medications  Medication Sig Dispense Refill   alendronate (FOSAMAX) 70 MG tablet Take 1 tablet (70 mg total) by mouth once a week. Take with a full glass of water on an empty stomach. 12 tablet 3   anastrozole (ARIMIDEX) 1 MG tablet Take 1 tablet by mouth once daily 90 tablet 0   cholecalciferol (VITAMIN D) 1000 UNITS tablet Take 1,000 Units by mouth daily.     cycloSPORINE (RESTASIS) 0.05 % ophthalmic emulsion Place 1 drop into both eyes 2 (two) times daily.     ELIQUIS 2.5 MG TABS tablet Take 1 tablet by mouth twice daily 180 tablet 0   hyaluronate sodium (RADIAPLEXRX) GEL Apply 1 application topically 3 (three) times daily. 170 g 0   NYSTATIN powder APPLY 1 APPLICATION TOPICALLY 3 TIMES DAILY 15 g 0   Omega-3 Fatty Acids (FISH OIL) 1000 MG CAPS Take 1 capsule by mouth daily.     pantoprazole (PROTONIX) 40 MG tablet TAKE 1 TABLET BY MOUTH ONCE DAILY AS NEEDED 90 tablet 0   Probiotic Product (PROBIOTIC-10 PO) Take 1 capsule by mouth daily.     rosuvastatin (CRESTOR) 20 MG tablet Take 1 tablet (20 mg total) by mouth daily. [place]ricelyn  tablet 1   No current facility-administered medications for this visit.    PHYSICAL EXAMINATION: ECOG PERFORMANCE STATUS: 1 - Symptomatic but completely ambulatory  Vitals:   03/24/21 1051  BP: (!) 133/55  Pulse: 67  Resp: 18  Temp: (!) 97.4 F (36.3 C)  SpO2: 97%   Filed Weights   03/24/21 1051  Weight: 217 lb 8 oz (98.7 kg)    LABORATORY DATA:  I have reviewed the data as listed CMP Latest Ref Rng &  Units 11/20/2020 11/08/2019 01/30/2019  Glucose 70 - 99 mg/dL 100(H) 111(H) 111(H)  BUN 6 - 23 mg/dL 21 18 18   Creatinine 0.40 - 1.20 mg/dL 0.89 0.80 0.99  Sodium 135 - 145 mEq/L 140 139 140  Potassium 3.5 - 5.1 mEq/L 4.3 4.4 4.3  Chloride 96 - 112 mEq/L 103 104 105  CO2 19 - 32 mEq/L 29 28 26   Calcium 8.4 - 10.5 mg/dL 10.1 9.7 10.1  Total Protein 6.0 - 8.3 g/dL 7.5 - 7.4  Total Bilirubin 0.2 - 1.2 mg/dL 0.7 - 0.7  Alkaline Phos 39 - 117 U/L 98 - 118(H)  AST 0 - 37 U/L 43(H) - 29  ALT 0 - 35 U/L 54(H) - 41(H)    Lab Results  Component Value Date   WBC 7.5 11/20/2020   HGB 15.4 (H) 11/20/2020   HCT 45.5 11/20/2020   MCV 98.8 11/20/2020   PLT 242.0 11/20/2020   NEUTROABS 3.4 11/20/2020    ASSESSMENT & PLAN:  Breast cancer of upper-inner quadrant of right female breast Rt Lumpectomy 11/14/14: IDC Grade 3, 2.7 cm size, 0/3 LN , Margins clear; T2N0 Stage IIA Right breast invasive ductal carcinoma diagnosed with screening mammogram, 3.1 cm by ultrasound in the upper quadrant 6 cm from the nipple, grade 2-3, EF 100%, PR 48%, HER-2 negative ratio 0.6, Ki-67 was 28%, Oncotype DX score 19, 12% risk of recurrence, status post radiation 4800 cGy in 24 sessions completed 01/29/2015   Treatment plan: antiestrogen therapy with anastrozole 1 mg daily started 02/14/2015, plan for 7 years. She wants to take it longer but I am concerned about her bone density and her inability to take bisphosphonates   Anastrozole toxicities: Tolerating it very well without any side effects   Breast Cancer Surveillance: 1. Breast exam 03/24/2021: Benign 2. Mammograms 03/05/2021:No concern for cancer, breast density category B 3. Bone density:  03/10/2019: T score -2.5: Osteoporosis   Osteoporosis: Recommended calcium vitamin D and bisphosphonate therapy.  I sent a prescription for Fosamax. But she hasnot taken it because of dental issues    Return to clinic in 1 year for follow-up    No orders of the defined  types were placed in this encounter.  The patient has a good understanding of the overall plan. she agrees with it. she will call with any problems that may develop before the next visit here.  Total time spent: 20 mins including face to face time and time spent for planning, charting and coordination of care  Rulon Eisenmenger, MD, MPH 03/24/2021  I, Thana Ates, am acting as scribe for Dr. [name].  I have reviewed the above documentation for accuracy and completeness, and I agree with the above.

## 2021-03-24 ENCOUNTER — Other Ambulatory Visit: Payer: Self-pay

## 2021-03-24 ENCOUNTER — Inpatient Hospital Stay: Payer: Medicare Other | Attending: Hematology and Oncology | Admitting: Hematology and Oncology

## 2021-03-24 DIAGNOSIS — Z79811 Long term (current) use of aromatase inhibitors: Secondary | ICD-10-CM | POA: Diagnosis not present

## 2021-03-24 DIAGNOSIS — C50211 Malignant neoplasm of upper-inner quadrant of right female breast: Secondary | ICD-10-CM

## 2021-03-24 DIAGNOSIS — Z170 Estrogen receptor positive status [ER+]: Secondary | ICD-10-CM | POA: Insufficient documentation

## 2021-03-24 DIAGNOSIS — M810 Age-related osteoporosis without current pathological fracture: Secondary | ICD-10-CM | POA: Insufficient documentation

## 2021-03-24 MED ORDER — ANASTROZOLE 1 MG PO TABS: 1.0000 mg | ORAL_TABLET | Freq: Every day | ORAL | 3 refills | Status: DC

## 2021-03-24 NOTE — Assessment & Plan Note (Signed)
Rt Lumpectomy 11/14/14: IDC Grade 3, 2.7 cm size, 0/3 LN , Margins clear; T2N0 Stage IIA Right breast invasive ductal carcinoma diagnosed with screening mammogram, 3.1 cm by ultrasound in the upper quadrant 6 cm from the nipple, grade 2-3, EF 100%, PR 48%, HER-2 negative ratio 0.6, Ki-67 was 28%, Oncotype DX score 19, 12% risk of recurrence, status post radiation 4800 cGy in 24 sessions completed 01/29/2015  Treatment plan: antiestrogen therapy with anastrozole 1 mg daily started 02/14/2015, plan for 7 years.  Anastrozole toxicities: Tolerating it very well without any side effects  Breast Cancer Surveillance: 1. Breast exam8/29/2022: Benign 2. Mammograms 03/05/2021:No concern for cancer, breast density categoryB 3. Bone density: 03/10/2019: T score -2.5: Osteoporosis  Osteoporosis: Recommended calcium vitamin D and bisphosphonate therapy.  I sent a prescription for Fosamax.  Fungal infection underneath left breast: I sent a prescription for nystatin powder  Return to clinic in 1 year for follow-up

## 2021-05-09 ENCOUNTER — Other Ambulatory Visit: Payer: Self-pay | Admitting: Internal Medicine

## 2021-05-09 DIAGNOSIS — E785 Hyperlipidemia, unspecified: Secondary | ICD-10-CM

## 2021-05-09 MED ORDER — ROSUVASTATIN CALCIUM 20 MG PO TABS: 20.0000 mg | ORAL_TABLET | Freq: Every day | ORAL | 1 refills | Status: DC

## 2021-05-14 ENCOUNTER — Telehealth: Payer: Self-pay

## 2021-05-14 NOTE — Progress Notes (Signed)
    Chronic Care Management Pharmacy Assistant   Name: Stacey Navarro  MRN: [id number] DOB: 1941/07/15    Reason for Encounter: Disease State   Conditions to be addressed/monitored: General   Recent office visits:  None ID  Recent consult visits:  03/24/21 [name], MD-Hematology/Oncology (Malignant neoplasm of upper-inner quadrant of right breast in female, estrogen receptor positive) no med changes  Hospital visits:  None in previous 6 months  Medications: Outpatient Encounter Medications as of 05/14/2021  Medication Sig   anastrozole (ARIMIDEX) 1 MG tablet Take 1 tablet (1 mg total) by mouth daily.   cholecalciferol (VITAMIN D) 1000 UNITS tablet Take 1,000 Units by mouth daily.   cycloSPORINE (RESTASIS) 0.05 % ophthalmic emulsion Place 1 drop into both eyes 2 (two) times daily.   ELIQUIS 2.5 MG TABS tablet Take 1 tablet by mouth twice daily   hyaluronate sodium (RADIAPLEXRX) GEL Apply 1 application topically 3 (three) times daily.   NYSTATIN powder APPLY 1 APPLICATION TOPICALLY 3 TIMES DAILY   Omega-3 Fatty Acids (FISH OIL) 1000 MG CAPS Take 1 capsule by mouth daily.   pantoprazole (PROTONIX) 40 MG tablet TAKE 1 TABLET BY MOUTH ONCE DAILY AS NEEDED   Probiotic Product (PROBIOTIC-10 PO) Take 1 capsule by mouth daily.   rosuvastatin (CRESTOR) 20 MG tablet Take 1 tablet (20 mg total) by mouth daily.   No facility-administered encounter medications on file as of 05/14/2021.   Reviewed chart for medication changes and drug therapy problems ahead of medication adherence call.  Attempted to contact patient x 3 for medication review and health check, unable to reach patient, left voicemails to return call.     Care Gaps: Colonoscopy-NA Diabetic Foot Exam-NA Mammogram-NA Ophthalmology-NA Dexa Scan - NA Annual Well Visit - NA Micro albumin-NA Hemoglobin A1c- 11/20/20  Star Rating Drugs: Rosuvastatin 20 mg last fill 05/13/21  Ethelene Hal Clinical Pharmacist  Assistant [phone]

## 2021-06-10 ENCOUNTER — Other Ambulatory Visit: Payer: Self-pay | Admitting: Internal Medicine

## 2021-06-10 DIAGNOSIS — I82562 Chronic embolism and thrombosis of left calf muscular vein: Secondary | ICD-10-CM

## 2021-06-10 DIAGNOSIS — D6859 Other primary thrombophilia: Secondary | ICD-10-CM

## 2021-07-08 ENCOUNTER — Telehealth: Payer: Self-pay

## 2021-07-08 NOTE — Chronic Care Management (AMB) (Signed)
° ° °  Chronic Care Management Pharmacy Assistant   Name: ABRYANA LYKENS  MRN: [id number] DOB: 1941/07/16   Reason for Encounter: Disease State-General Call   Recent office visits:  None ID  Recent consult visits:  None ID  Hospital visits:  None in previous 6 months  Medications: Outpatient Encounter Medications as of 07/08/2021  Medication Sig   anastrozole (ARIMIDEX) 1 MG tablet Take 1 tablet (1 mg total) by mouth daily.   cholecalciferol (VITAMIN D) 1000 UNITS tablet Take 1,000 Units by mouth daily.   cycloSPORINE (RESTASIS) 0.05 % ophthalmic emulsion Place 1 drop into both eyes 2 (two) times daily.   ELIQUIS 2.5 MG TABS tablet Take 1 tablet by mouth twice daily   hyaluronate sodium (RADIAPLEXRX) GEL Apply 1 application topically 3 (three) times daily.   NYSTATIN powder APPLY 1 APPLICATION TOPICALLY 3 TIMES DAILY   Omega-3 Fatty Acids (FISH OIL) 1000 MG CAPS Take 1 capsule by mouth daily.   pantoprazole (PROTONIX) 40 MG tablet TAKE 1 TABLET BY MOUTH ONCE DAILY AS NEEDED   Probiotic Product (PROBIOTIC-10 PO) Take 1 capsule by mouth daily.   rosuvastatin (CRESTOR) 20 MG tablet Take 1 tablet (20 mg total) by mouth daily.   No facility-administered encounter medications on file as of 07/08/2021.   Reviewed chart for medication changes and drug therapy problems ahead of medication adherence call.  Attempted to contact patient x 3 for medication review and health check, unable to reach patient, left voicemails to return call.    Care Gaps: Colonoscopy-NA Diabetic Foot Exam-NA Mammogram-NA Ophthalmology-NA Dexa Scan - NA Annual Well Visit - NA Micro albumin-NA Hemoglobin A1c- 11/20/20  Star Rating Drugs: Rosuvastatin 20 mg last fill 05/13/21 90 ds   Ethelene Hal Clinical Pharmacist Assistant [phone]

## 2021-09-02 ENCOUNTER — Ambulatory Visit (INDEPENDENT_AMBULATORY_CARE_PROVIDER_SITE_OTHER): Payer: Medicare Other

## 2021-09-02 DIAGNOSIS — E785 Hyperlipidemia, unspecified: Secondary | ICD-10-CM

## 2021-09-02 DIAGNOSIS — D6859 Other primary thrombophilia: Secondary | ICD-10-CM

## 2021-09-02 DIAGNOSIS — K2100 Gastro-esophageal reflux disease with esophagitis, without bleeding: Secondary | ICD-10-CM

## 2021-09-02 NOTE — Progress Notes (Signed)
Chronic Care Management Pharmacy Note  09/02/2021 Name:  Stacey Navarro MRN:  [id number] DOB:  05/05/1941  Summary: -Patient reports that she has been doing well since starting rosuvastatin, denies any issues with medication -Has not yet started alendronate, was recommended by heme/onc to be evaluated by her dentist before starting - patient has not yet made appointment  -confirms adherence to all other medications   Recommendations/Changes made from today's visit: -Patient to make appointment with dentist as directed by heme/onc  -F/u PCP appointment made (annual visit) - would recommend for updated lipid panel to assess effectiveness of current statin dose  Plan: F/u in 6 months   Subjective: Stacey Navarro is an 81 y.o. year old female who is a primary patient of [name], MD.  The CCM team was consulted for assistance with disease management and care coordination needs.    Engaged with patient by telephone for follow up visit in response to provider referral for pharmacy case management and/or care coordination services.   Consent to Services:  The patient was given the following information about Chronic Care Management services today, agreed to services, and gave verbal consent: 1. CCM service includes personalized support from designated clinical staff supervised by the primary care provider, including individualized plan of care and coordination with other care providers 2. 24/7 contact phone numbers for assistance for urgent and routine care needs. 3. Service will only be billed when office clinical staff spend 20 minutes or more in a month to coordinate care. 4. Only one practitioner may furnish and bill the service in a calendar month. 5.The patient may stop CCM services at any time (effective at the end of the month) by phone call to the office staff. 6. The patient will be responsible for cost sharing (co-pay) of up to 20% of the service fee (after annual deductible is  met). Patient agreed to services and consent obtained.  Patient Care Team: [name], MD as PCP - General (Internal Medicine) [name], MD as Consulting Physician (General Surgery) [name], MD as Consulting Physician (Hematology and Oncology) [name], MD (Inactive) as Consulting Physician (Radiation Oncology) Mauro Kaufmann, RN as Registered Nurse Rockwell Germany, RN as Registered Nurse Holley Bouche, NP (Inactive) as Nurse Practitioner (Nurse Practitioner) Marchia Bond, MD as Consulting Physician (Orthopedic Surgery) Sylvan Cheese, NP as Nurse Practitioner (Hematology and Oncology) Stefan Church, OD as Consulting Physician (Optometry) Tomasa Blase, [facility] (Pharmacist)  Recent office visits: 11/20/2020 - Dr. Ronnald Ramp - start rosuvastatin 70m daily   Recent consult visits: 03/24/2021 - Dr. GLindi Adie- Heme/Onc - patient stopped alendronate due to dental issues - follow up in 1 year   Hospital visits: None in previous 6 months  Objective:  Lab Results  Component Value Date   CREATININE 0.89 11/20/2020   BUN 21 11/20/2020   GFR 62.23 11/20/2020   NA 140 11/20/2020   K 4.3 11/20/2020   CALCIUM 10.1 11/20/2020   CO2 29 11/20/2020   GLUCOSE 100 (H) 11/20/2020    Lab Results  Component Value Date/Time   HGBA1C 5.8 11/20/2020 03:29 PM   HGBA1C 6.0 01/30/2019 11:29 AM   GFR 62.23 11/20/2020 03:29 PM   GFR 69.54 11/08/2019 01:55 PM    Last diabetic Eye exam:  No results found for: HMDIABEYEEXA  Last diabetic Foot exam:  No results found for: HMDIABFOOTEX   Lab Results  Component Value Date   CHOL 247 (H) 11/20/2020   HDL 64.20  11/20/2020   LDLCALC 163 (H) 11/20/2020   LDLDIRECT 158.4 07/17/2011   TRIG 100.0 11/20/2020   CHOLHDL 4 11/20/2020    Hepatic Function Latest Ref Rng & Units 11/20/2020 01/30/2019 07/21/2017  Total Protein 6.0 - 8.3 g/dL 7.5 7.4 7.4  Albumin 3.5 - 5.2 g/dL 4.2 4.2 4.2  AST 0 - 37 U/L 43(H) 29 30  ALT  0 - 35 U/L 54(H) 41(H) 34  Alk Phosphatase 39 - 117 U/L 98 118(H) 103  Total Bilirubin 0.2 - 1.2 mg/dL 0.7 0.7 0.5  Bilirubin, Direct 0.0 - 0.3 mg/dL 0.1 - -    Lab Results  Component Value Date/Time   TSH 2.94 11/20/2020 03:29 PM   TSH 3.45 01/30/2019 11:29 AM    CBC Latest Ref Rng & Units 11/20/2020 11/08/2019 01/30/2019  WBC 4.0 - 10.5 K/uL 7.5 5.5 6.4  Hemoglobin 12.0 - 15.0 g/dL 15.4(H) 16.6(H) 15.5(H)  Hematocrit 36.0 - 46.0 % 45.5 49.8(H) 45.8  Platelets 150.0 - 400.0 K/uL 242.0 243.0 258.0    No results found for: VD25OH  Clinical ASCVD: No  The 10-year ASCVD risk score (Arnett DK, et al., 2019) is: 23.6%   Values used to calculate the score:     Age: 89 years     Sex: Female     Is Non-Hispanic African American: No     Diabetic: No     Tobacco smoker: No     Systolic Blood Pressure: 725 mmHg     Is BP treated: No     HDL Cholesterol: 64.2 mg/dL     Total Cholesterol: 247 mg/dL    Depression screen [facility] 2/9 11/20/2020 01/30/2019 07/21/2017  Decreased Interest 0 0 0  Down, Depressed, Hopeless 0 0 0  PHQ - 2 Score 0 0 0    Social History   Tobacco Use  Smoking Status Former   Packs/day: 1.00   Types: Cigarettes   Quit date: 11/06/1995   Years since quitting: 25.8  Smokeless Tobacco Never   BP Readings from Last 3 Encounters:  03/24/21 (!) 133/55  11/20/20 120/70  11/20/20 120/70   Pulse Readings from Last 3 Encounters:  03/24/21 67  11/20/20 74  11/20/20 74   Wt Readings from Last 3 Encounters:  03/24/21 217 lb 8 oz (98.7 kg)  11/20/20 215 lb (97.5 kg)  11/20/20 215 lb 3.2 oz (97.6 kg)   BMI Readings from Last 3 Encounters:  03/24/21 35.11 kg/m  11/20/20 34.70 kg/m  11/20/20 34.73 kg/m    Assessment/Interventions: Review of patient past medical history, allergies, medications, health status, including review of consultants reports, laboratory and other test data, was performed as part of comprehensive evaluation and provision of chronic care  management services.   SDOH:  (Social Determinants of Health) assessments and interventions performed: Yes  SDOH Screenings   Alcohol Screen: Low Risk    Last Alcohol Screening Score (AUDIT): 3  Depression (PHQ2-9): Low Risk    PHQ-2 Score: 0  Financial Resource Strain: Low Risk    Difficulty of Paying Living Expenses: Not hard at all  Food Insecurity: No Food Insecurity   Worried About Charity fundraiser in the Last Year: Never true   Ran Out of Food in the Last Year: Never true  Housing: Low Risk    Last Housing Risk Score: 0  Physical Activity: Insufficiently Active   Days of Exercise per Week: 3 days   Minutes of Exercise per Session: 30 min  Social Connections: Moderately Integrated  Frequency of Communication with Friends and Family: More than three times a week   Frequency of Social Gatherings with Friends and Family: Once a week   Attends Religious Services: 1 to 4 times per year   Active Member of [employer] or Organizations: No   Attends Music therapist: 1 to 4 times per year   Marital Status: Never married  Stress: No Stress Concern Present   Feeling of Stress : Not at all  Tobacco Use: Medium Risk   Smoking Tobacco Use: Former   Smokeless Tobacco Use: Never   Passive Exposure: Not on file  Transportation Needs: No Transportation Needs   Lack of Transportation (Medical): No   Lack of Transportation (Non-Medical): No    CCM Care Plan  Allergies  Allergen Reactions   Xarelto [Rivaroxaban]     Rash    Medications Reviewed Today     Reviewed by Tomasa Blase, [facility] (Pharmacist) on 09/02/21 at [id number]  Med List Status: <None>   Medication Order Taking? Sig Documenting Provider Last Dose Status Informant  anastrozole (ARIMIDEX) 1 MG tablet [id number] Yes Take 1 tablet (1 mg total) by mouth daily. [name], MD Taking Active   cholecalciferol (VITAMIN D) 1000 UNITS tablet [id number] Yes Take 1,000 Units by mouth daily. [provider] Taking  Active   ELIQUIS 2.5 MG TABS tablet [id number] Yes Take 1 tablet by mouth twice daily [name], MD Taking Active   Omega-3 Fatty Acids (FISH OIL) 1000 MG CAPS [id number] Yes Take 1 capsule by mouth daily. [provider] Taking Active Self  pantoprazole (PROTONIX) 40 MG tablet [id number] Yes TAKE 1 TABLET BY MOUTH ONCE DAILY AS NEEDED [name], MD Taking Active   rosuvastatin (CRESTOR) 20 MG tablet [id number] Yes Take 1 tablet (20 mg total) by mouth daily. [name], MD Taking Active   vitamin E 180 MG (400 UNITS) capsule [id number] Yes Take 1,200 Units by mouth daily. [provider] Taking Active             Patient Active Problem List   Diagnosis Date Noted   Hypercoagulable state, primary ([place]) 11/08/2019   Chronic deep vein thrombosis (DVT) of calf muscle vein of left lower extremity ([place]) 11/08/2019   Varicose veins of bilateral lower extremities with other complications 20/94/7096   Prediabetes 03/16/2016   Obesity (BMI 35.0-39.9 without comorbidity) 03/16/2016   Carpal tunnel syndrome 02/12/2015   Breast cancer of upper-inner quadrant of right female breast ([place]) 10/23/2014   Elevated LFTs 10/03/2014   Osteoarthritis of right knee 12/14/2012   GERD (gastroesophageal reflux disease) 07/17/2011   Hyperlipidemia with target LDL less than 130 07/17/2011   Visit for screening mammogram 07/17/2011   Routine general medical examination at a health care facility 07/17/2011    Immunization History  Administered Date(s) Administered   PFIZER(Purple Top)SARS-COV-2 Vaccination 09/02/2019, 09/27/2019, 05/02/2020, 06/26/2021   Pneumococcal Conjugate-13 10/03/2014   Pneumococcal Polysaccharide-23 03/16/2016   Tdap 10/03/2014   Zoster Recombinat (Shingrix) 01/19/2018, 06/17/2018   Zoster, Live 03/16/2016    Conditions to be addressed/monitored:  Hyperlipidemia, GERD, Osteoporosis, and history of DVT   Care Plan : [place]  Updates  made by Tomasa Blase, RPH since 09/02/2021 12:00 AM     Problem: HLD, Hx of DVT, Osteoporosis, GERD   Priority: High  Onset Date: 09/02/2021     Long-Range Goal: Disease Management   Start Date: 09/02/2021  Expected End Date: 09/02/2022  This Visit's Progress: On  track  Priority: Medium  Note:   Current Barriers:  Unable to independently monitor therapeutic efficacy  Pharmacist Clinical Goal(s):  Patient will achieve adherence to monitoring guidelines and medication adherence to achieve therapeutic efficacy through collaboration with PharmD and provider.   Interventions: 1:1 collaboration with [name], MD regarding development and update of comprehensive plan of care as evidenced by provider attestation and co-signature Inter-disciplinary care team collaboration (see longitudinal plan of care) Comprehensive medication review performed; medication list updated in electronic medical record  Hyperlipidemia: (LDL goal < 130) -Unknown level of control - rosuvastatin started with last PCP visit, no repeat lipid panel as of this time  Lab Results  Component Value Date   LDLCALC 163 (H) 11/20/2020  -Current treatment: Rosuvastatin 6m daily  Fish Oil 10064mdaily  -Medications previously tried: n/a  -Current dietary patterns: no major changes since last visit  -Current exercise habits: no major changes since last visit  -Educated on Cholesterol goals;  Benefits of statin for ASCVD risk reduction; Importance of limiting foods high in cholesterol; Exercise goal of 150 minutes per week; -Counseled on diet and exercise extensively Recommended to continue current medication  History of DVT (Goal: prevent thrombus and major bleeding) -Controlled -Current treatment: Eliquis 2.70m11mID  -Medications previously tried: Xarelto  -Counseled on importance of adherence to anticoagulant exactly as prescribed; bleeding risk associated with eliquis and importance of self-monitoring for  signs/symptoms of bleeding; avoidance of NSAIDs due to increased bleeding risk with anticoagulants; seeking medical attention after a head injury or if there is blood in the urine/stool; -Recommended to continue current medication  Osteoporosis / Osteopenia (Goal Prevention of fractures / disease progression ) -Not ideally controlled -Last DEXA Scan: 03/10/2019   T-Score left femoral neck: -2.4  T-Score right femoral neck: -2.5  T-Score left forearm radius: -1.1  10-year probability of major osteoporotic fracture: 16.2%  10-year probability of hip fracture: 5.1% -Current treatment  Vitamin D 1000 units daily  -Medications previously tried: alendronate  -patient to make appointment with dentist (per heme/onc - prior to starting alendronate) -Recommended to continue current medication  GERD (Goal: Prevention of Acid Reflux) -Controlled -Current treatment  Pantoprazole 90m6mily  -Medications previously tried: famotidine   -Counseled on diet and exercise extensively Recommended to continue current medication  Health Maintenance -Vaccine gaps: Flu Vaccine  -Patient is satisfied with current therapy and denies issues -Recommended to continue current medication  Patient Goals/Self-Care Activities Patient will:  - take medications as prescribed as evidenced by patient report and record review  Follow Up Plan: Telephone follow up appointment with care management team member scheduled for: 6 months  The patient has been provided with contact information for the care management team and has been advised to call with any health related questions or concerns.         Medication Assistance: None required.  Patient affirms current coverage meets needs.  Care Gaps: COVID booster Influenza Vaccine   [facility] [place]: [phone]:  [phone]) [place]: [phone]: [phone] pill box? No Pt endorses 100% compliance  Care Plan and Follow Up Patient Decision:  Patient agrees to Care Plan and Follow-up.  Plan: Telephone follow up appointment with care management  team member scheduled for:  6 months  The patient has been provided with contact information for the care management team and has been advised to call with any health related questions or concerns.   Tomasa Blase, PharmD Clinical Pharmacist, [place]

## 2021-09-02 NOTE — Patient Instructions (Signed)
Visit Information  Following are the goals we discussed today:   Manage My Medicine   Timeframe:  Long-Range Goal Priority:  Medium Start Date:  09/02/2021                         Expected End Date: 09/02/2022                      Follow Up Date 03/02/2022   - call for medicine refill 2 or 3 days before it runs out - call if I am sick and can't take my medicine - keep a list of all the medicines I take; vitamins and herbals too - learn to read medicine labels    Why is this important?   These steps will help you keep on track with your medicines.  Plan: Telephone follow up appointment with care management team member scheduled for:  6 months  The patient has been provided with contact information for the care management team and has been advised to call with any health related questions or concerns.   Tomasa Blase, PharmD Clinical Pharmacist, [facility]   Please call the care guide team at [phone] if you need to cancel or reschedule your appointment.   Patient verbalizes understanding of instructions and care plan provided today and agrees to view in [place]. Active MyChart status confirmed with patient.

## 2021-09-23 DIAGNOSIS — K2100 Gastro-esophageal reflux disease with esophagitis, without bleeding: Secondary | ICD-10-CM | POA: Diagnosis not present

## 2021-09-23 DIAGNOSIS — D6859 Other primary thrombophilia: Secondary | ICD-10-CM | POA: Diagnosis not present

## 2021-09-23 DIAGNOSIS — E785 Hyperlipidemia, unspecified: Secondary | ICD-10-CM

## 2021-10-02 ENCOUNTER — Other Ambulatory Visit: Payer: Self-pay | Admitting: Internal Medicine

## 2021-10-02 DIAGNOSIS — I82562 Chronic embolism and thrombosis of left calf muscular vein: Secondary | ICD-10-CM

## 2021-10-02 DIAGNOSIS — D6859 Other primary thrombophilia: Secondary | ICD-10-CM

## 2021-10-07 DIAGNOSIS — H35363 Drusen (degenerative) of macula, bilateral: Secondary | ICD-10-CM | POA: Diagnosis not present

## 2021-10-29 ENCOUNTER — Other Ambulatory Visit: Payer: Self-pay | Admitting: Internal Medicine

## 2021-10-29 DIAGNOSIS — I82562 Chronic embolism and thrombosis of left calf muscular vein: Secondary | ICD-10-CM

## 2021-10-29 DIAGNOSIS — D6859 Other primary thrombophilia: Secondary | ICD-10-CM

## 2021-11-03 ENCOUNTER — Encounter: Payer: Self-pay | Admitting: Internal Medicine

## 2021-11-03 ENCOUNTER — Ambulatory Visit (INDEPENDENT_AMBULATORY_CARE_PROVIDER_SITE_OTHER): Payer: Medicare Other | Admitting: Internal Medicine

## 2021-11-03 VITALS — BP 134/78 | HR 72 | Temp 97.6°F | Ht 66.0 in | Wt 230.0 lb

## 2021-11-03 DIAGNOSIS — R7989 Other specified abnormal findings of blood chemistry: Secondary | ICD-10-CM

## 2021-11-03 DIAGNOSIS — K219 Gastro-esophageal reflux disease without esophagitis: Secondary | ICD-10-CM | POA: Diagnosis not present

## 2021-11-03 DIAGNOSIS — R0609 Other forms of dyspnea: Secondary | ICD-10-CM

## 2021-11-03 DIAGNOSIS — F322 Major depressive disorder, single episode, severe without psychotic features: Secondary | ICD-10-CM

## 2021-11-03 DIAGNOSIS — Z0001 Encounter for general adult medical examination with abnormal findings: Secondary | ICD-10-CM | POA: Insufficient documentation

## 2021-11-03 DIAGNOSIS — R7303 Prediabetes: Secondary | ICD-10-CM

## 2021-11-03 DIAGNOSIS — E669 Obesity, unspecified: Secondary | ICD-10-CM

## 2021-11-03 DIAGNOSIS — D6859 Other primary thrombophilia: Secondary | ICD-10-CM | POA: Diagnosis not present

## 2021-11-03 DIAGNOSIS — E785 Hyperlipidemia, unspecified: Secondary | ICD-10-CM | POA: Diagnosis not present

## 2021-11-03 LAB — LIPID PANEL
Cholesterol: 183 mg/dL (ref 0–200)
HDL: 66 mg/dL (ref >39.00)
LDL Cholesterol: 95 mg/dL (ref 0–99)
NonHDL: 117.16
Total CHOL/HDL Ratio: 3
Triglycerides: 110 mg/dL (ref 0.0–149.0)
VLDL: 22 mg/dL (ref 0.0–40.0)

## 2021-11-03 LAB — BASIC METABOLIC PANEL
BUN: 21 mg/dL (ref 6–23)
CO2: 28 mEq/L (ref 19–32)
Calcium: 9.9 mg/dL (ref 8.4–10.5)
Chloride: 105 mEq/L (ref 96–112)
Creatinine, Ser: 0.83 mg/dL (ref 0.40–1.20)
GFR: 67.21 mL/min (ref >60.00)
Glucose, Bld: 116 mg/dL — ABNORMAL HIGH (ref 70–99)
Potassium: 4.5 mEq/L (ref 3.5–5.1)
Sodium: 140 mEq/L (ref 135–145)

## 2021-11-03 LAB — CBC WITH DIFFERENTIAL/PLATELET
Basophils Absolute: 0 10*3/uL (ref 0.0–0.1)
Basophils Relative: 0.6 % (ref 0.0–3.0)
Eosinophils Absolute: 0.2 10*3/uL (ref 0.0–0.7)
Eosinophils Relative: 2.2 % (ref 0.0–5.0)
HCT: 40.7 % (ref 36.0–46.0)
Hemoglobin: 13.5 g/dL (ref 12.0–15.0)
Lymphocytes Relative: 32.9 % (ref 12.0–46.0)
Lymphs Abs: 2.4 10*3/uL (ref 0.7–4.0)
MCHC: 33.3 g/dL (ref 30.0–36.0)
MCV: 95.7 fl (ref 78.0–100.0)
Monocytes Absolute: 0.7 10*3/uL (ref 0.1–1.0)
Monocytes Relative: 9.9 % (ref 3.0–12.0)
Neutro Abs: 3.9 10*3/uL (ref 1.4–7.7)
Neutrophils Relative %: 54.4 % (ref 43.0–77.0)
Platelets: 235 10*3/uL (ref 150.0–400.0)
RBC: 4.25 Mil/uL (ref 3.87–5.11)
RDW: 14.3 % (ref 11.5–15.5)
WBC: 7.2 10*3/uL (ref 4.0–10.5)

## 2021-11-03 LAB — HEPATIC FUNCTION PANEL
ALT: 34 U/L (ref 0–35)
AST: 31 U/L (ref 0–37)
Albumin: 4.3 g/dL (ref 3.5–5.2)
Alkaline Phosphatase: 105 U/L (ref 39–117)
Bilirubin, Direct: 0.1 mg/dL (ref 0.0–0.3)
Total Bilirubin: 0.4 mg/dL (ref 0.2–1.2)
Total Protein: 7.2 g/dL (ref 6.0–8.3)

## 2021-11-03 LAB — TROPONIN I (HIGH SENSITIVITY): High Sens Troponin I: 7 ng/L (ref 2–17)

## 2021-11-03 LAB — BRAIN NATRIURETIC PEPTIDE: Pro B Natriuretic peptide (BNP): 63 pg/mL (ref 0.0–100.0)

## 2021-11-03 LAB — HEMOGLOBIN A1C: Hgb A1c MFr Bld: 6.1 % (ref 4.6–6.5)

## 2021-11-03 LAB — TSH: TSH: 2.36 u[IU]/mL (ref 0.35–5.50)

## 2021-11-03 MED ORDER — BUPROPION HCL ER (XL) 150 MG PO TB24: 150.0000 mg | ORAL_TABLET | Freq: Every day | ORAL | 0 refills | Status: DC

## 2021-11-03 MED ORDER — DULOXETINE HCL 30 MG PO CPEP: 30.0000 mg | ORAL_CAPSULE | Freq: Every day | ORAL | 0 refills | Status: DC

## 2021-11-03 NOTE — Patient Instructions (Signed)

## 2021-11-03 NOTE — Progress Notes (Signed)
? ?Subjective:  ?Patient ID: Stacey Navarro, female    DOB: [birth year]  Age: 81 y.o. MRN: [id number] ? ?CC: Annual Exam and Depression ? ? ?HPI ?Stacey Navarro presents for a CPX and f/up - ? ?She complains of a one month hx of DOE but denies CP, diaphoresis, edema, or palpitations. ? ?Outpatient Medications Prior to Visit  ?Medication Sig Dispense Refill  ? anastrozole (ARIMIDEX) 1 MG tablet Take 1 tablet (1 mg total) by mouth daily. 90 tablet 3  ? cholecalciferol (VITAMIN D) 1000 UNITS tablet Take 1,000 Units by mouth daily.    ? ELIQUIS 2.5 MG TABS tablet Take 1 tablet by mouth twice daily 60 tablet 0  ? Omega-3 Fatty Acids (FISH OIL) 1000 MG CAPS Take 1 capsule by mouth daily.    ? pantoprazole (PROTONIX) 40 MG tablet TAKE 1 TABLET BY MOUTH ONCE DAILY AS NEEDED 90 tablet 0  ? vitamin E 180 MG (400 UNITS) capsule Take 1,200 Units by mouth daily.    ? rosuvastatin (CRESTOR) 20 MG tablet Take 1 tablet (20 mg total) by mouth daily. 90 tablet 1  ? ?No facility-administered medications prior to visit.  ? ? ?ROS ?Review of Systems  ?Constitutional:  Positive for fatigue and unexpected weight change (wt gain). Negative for appetite change, chills, diaphoresis and fever.  ?HENT:  Negative for congestion.   ?Eyes: Negative.   ?Respiratory:  Positive for shortness of breath. Negative for cough, chest tightness and wheezing.   ?Cardiovascular:  Negative for chest pain, palpitations and leg swelling.  ?Gastrointestinal:  Negative for abdominal pain, constipation, diarrhea, nausea and vomiting.  ?Endocrine: Negative.   ?Genitourinary: Negative.  Negative for difficulty urinating.  ?Musculoskeletal:  Positive for arthralgias. Negative for back pain, myalgias and neck pain.  ?Skin: Negative.   ?Neurological: Negative.  Negative for dizziness and weakness.  ?Hematological:  Negative for adenopathy. Does not bruise/bleed easily.  ?Psychiatric/Behavioral:  Positive for dysphoric mood. Negative for behavioral problems,  confusion, decreased concentration, self-injury, sleep disturbance and suicidal ideas. The patient is not nervous/anxious.   ? ?Objective:  ?BP 134/78 (BP Location: Right Arm, Patient Position: Sitting, Cuff Size: Normal)   Pulse 72   Temp 97.6 ?F (36.4 ?C) (Oral)   Ht 5' 6"  (1.676 m)   Wt 230 lb (104.3 kg)   SpO2 94%   BMI 37.12 kg/m?  ? ?BP Readings from Last 3 Encounters:  ?11/03/21 134/78  ?03/24/21 (!) 133/55  ?11/20/20 120/70  ? ? ?Wt Readings from Last 3 Encounters:  ?11/03/21 230 lb (104.3 kg)  ?03/24/21 217 lb 8 oz (98.7 kg)  ?11/20/20 215 lb (97.5 kg)  ? ? ?Physical Exam ?Vitals reviewed.  ?Constitutional:   ?   Appearance: She is obese.  ?HENT:  ?   Nose: Nose normal.  ?   Mouth/Throat:  ?   Mouth: Mucous membranes are moist.  ?Eyes:  ?   General: No scleral icterus. ?   Conjunctiva/sclera: Conjunctivae normal.  ?Cardiovascular:  ?   Rate and Rhythm: Normal rate and regular rhythm.  ?   Heart sounds: No murmur heard. ?  No friction rub. No gallop.  ?   Comments: EKG- ?NSR, 64 bpm ?No Q waves ?Normal EKG ?Pulmonary:  ?   Effort: Pulmonary effort is normal.  ?   Breath sounds: No stridor. No wheezing, rhonchi or rales.  ?Abdominal:  ?   General: Abdomen is protuberant. Bowel sounds are normal. There is no distension.  ?   Palpations: Abdomen is  soft. There is no hepatomegaly, splenomegaly or mass.  ?   Tenderness: There is no abdominal tenderness. There is no guarding.  ?Musculoskeletal:     ?   General: Normal range of motion.  ?   Cervical back: Neck supple.  ?   Right lower leg: No edema.  ?   Left lower leg: No edema.  ?Lymphadenopathy:  ?   Cervical: No cervical adenopathy.  ?Skin: ?   General: Skin is warm and dry.  ?Neurological:  ?   General: No focal deficit present.  ?   Mental Status: She is alert. Mental status is at baseline.  ?Psychiatric:     ?   Attention and Perception: She is inattentive.     ?   Mood and Affect: Mood is depressed. Mood is not anxious. Affect is flat.     ?   Speech:  Speech normal.     ?   Behavior: Behavior normal. Behavior is cooperative.     ?   Thought Content: Thought content normal.  ? ? ?Lab Results  ?Component Value Date  ? WBC 7.2 11/03/2021  ? HGB 13.5 11/03/2021  ? HCT 40.7 11/03/2021  ? PLT 235.0 11/03/2021  ? GLUCOSE 116 (H) 11/03/2021  ? CHOL 183 11/03/2021  ? TRIG 110.0 11/03/2021  ? HDL 66.00 11/03/2021  ? LDLDIRECT 158.4 07/17/2011  ? [place] 95 11/03/2021  ? ALT 34 11/03/2021  ? AST 31 11/03/2021  ? NA 140 11/03/2021  ? K 4.5 11/03/2021  ? CL 105 11/03/2021  ? CREATININE 0.83 11/03/2021  ? BUN 21 11/03/2021  ? CO2 28 11/03/2021  ? TSH 2.36 11/03/2021  ? HGBA1C 6.1 11/03/2021  ? ? ?MM 3D SCREEN BREAST BILATERAL ? ?Result Date: 03/05/2021 ?CLINICAL DATA:  Screening. EXAM: DIGITAL SCREENING BILATERAL MAMMOGRAM WITH TOMOSYNTHESIS AND CAD TECHNIQUE: Bilateral screening digital craniocaudal and mediolateral oblique mammograms were obtained. Bilateral screening digital breast tomosynthesis was performed. The images were evaluated with computer-aided detection. COMPARISON:  Previous exam(s). ACR Breast Density Category b: There are scattered areas of fibroglandular density. FINDINGS: There are no findings suspicious for malignancy. IMPRESSION: No mammographic evidence of malignancy. A result letter of this screening mammogram will be mailed directly to the patient. RECOMMENDATION: Screening mammogram in one year. (Code:SM-B-01Y) BI-RADS CATEGORY  1: Negative. Electronically Signed   By: Franki Cabot M.D.   On: 03/05/2021 08:52  ? ? ?Assessment & Plan:  ? ?Lora was seen today for annual exam and depression. ? ?Diagnoses and all orders for this visit: ? ?Hyperlipidemia with target LDL less than 130- LDL goal achieved. Doing well on the statin  ?-     Lipid panel; Future ?-     Hemoglobin A1c; Future ?-     Hemoglobin A1c ?-     Lipid panel ?-     rosuvastatin (CRESTOR) 20 MG tablet; Take 1 tablet (20 mg total) by mouth daily. ? ?Encounter for general adult medical  examination with abnormal findings- Exam completed, labs reviewed, vaccines are up-to-date, no cancer screenings indicated, patient education was given. ? ?Elevated LFTs- Her liver enzymes improved.  This is likely NASH. ?-     Hepatic function panel; Future ?-     Hepatic function panel ? ?Hypercoagulable state, primary ([place])- Will continue the DOAC. ? ?Gastroesophageal reflux disease without esophagitis ?-     CBC with Differential/Platelet; Future ?-     CBC with Differential/Platelet ? ?DOE (dyspnea on exertion)- Her EKG and labs are reassuring.  I  recommended that she undergo a coronary calcium score to screen for atherosclerosis. ?-     EKG 12-Lead ?-     Troponin I (High Sensitivity); Future ?-     Brain natriuretic peptide; Future ?-     CT CARDIAC SCORING (SELF PAY ONLY); Future ?-     Brain natriuretic peptide ?-     Troponin I (High Sensitivity) ? ?Current severe episode of major depressive disorder without psychotic features without prior episode [facility])- Labs are negative for secondary causes.  Will treat with duloxetine and bupropion. ?-     TSH; Future ?-     DULoxetine (CYMBALTA) 30 MG capsule; Take 1 capsule (30 mg total) by mouth daily. ?-     buPROPion (WELLBUTRIN XL) 150 MG 24 hr tablet; Take 1 tablet (150 mg total) by mouth daily. ?-     TSH ? ?Prediabetes-medical therapy is not indicated. ?-     Basic metabolic panel; Future ?-     Hemoglobin A1c; Future ?-     Hemoglobin A1c ?-     Basic metabolic panel ? ?Obesity (BMI 35.0-39.9 without comorbidity)- She is working on her lifestyle modifications. ? ? ?I am having Sylena A. Burgener start on DULoxetine and buPROPion. I am also having her maintain her cholecalciferol, Fish Oil, pantoprazole, anastrozole, vitamin E, Eliquis, and rosuvastatin. ? ?Meds ordered this encounter  ?Medications  ? DULoxetine (CYMBALTA) 30 MG capsule  ?  Sig: Take 1 capsule (30 mg total) by mouth daily.  ?  Dispense:  90 capsule  ?  Refill:  0  ? buPROPion (WELLBUTRIN XL)  150 MG 24 hr tablet  ?  Sig: Take 1 tablet (150 mg total) by mouth daily.  ?  Dispense:  90 tablet  ?  Refill:  0  ? rosuvastatin (CRESTOR) 20 MG tablet  ?  Sig: Take 1 tablet (20 mg total) by mouth daily.  ?  Dispense

## 2021-11-05 MED ORDER — ROSUVASTATIN CALCIUM 20 MG PO TABS: 20.0000 mg | ORAL_TABLET | Freq: Every day | ORAL | 1 refills | Status: DC

## 2021-11-28 ENCOUNTER — Ambulatory Visit (INDEPENDENT_AMBULATORY_CARE_PROVIDER_SITE_OTHER): Payer: Medicare Other

## 2021-11-28 ENCOUNTER — Telehealth: Payer: Self-pay | Admitting: Internal Medicine

## 2021-11-28 DIAGNOSIS — Z0000 Encounter for general adult medical examination without abnormal findings: Secondary | ICD-10-CM

## 2021-11-28 NOTE — Progress Notes (Signed)
? ?Subjective:  ? Stacey Navarro is a 81 y.o. female who presents for Medicare Annual (Subsequent) preventive examination. ? ?I connected with Harrie Foreman today by telephone and verified that I am speaking with the correct person using two identifiers. ?Location patient: home ?Location provider: work ?Persons participating in the virtual visit: patient, provider. ?  ?I discussed the limitations, risks, security and privacy concerns of performing an evaluation and management service by telephone and the availability of in person appointments. I also discussed with the patient that there may be a patient responsible charge related to this service. The patient expressed understanding and verbally consented to this telephonic visit.  ?  ?Interactive audio and video telecommunications were attempted between this provider and patient, however failed, due to patient having technical difficulties OR patient did not have access to video capability.  We continued and completed visit with audio only. ? ?  ?Review of Systems    ? ?Cardiac Risk Factors include: advanced age (>60mn, >>10women) ? ?   ?Objective:  ?  ?Today's Vitals  ? ?There is no height or weight on file to calculate BMI. ? ? ?  11/28/2021  ?  9:27 AM 11/20/2020  ?  2:42 PM 06/23/2016  ? 10:26 AM 03/16/2016  ?  4:56 PM 12/10/2015  ?  9:57 AM 07/12/2015  ?  6:25 AM 07/01/2015  ?  2:12 PM  ?Advanced Directives  ?Does Patient Have a Medical Advance Directive? No No Yes Yes No Yes Yes  ?Type of ATheatre stage managerof AMcGregorLiving will  [place] will [place] will  ?Does patient want to make changes to medical advance directive?    No - Patient declined  No - Patient declined   ?Copy of [place]?    Yes  Yes Yes  ?Would patient like information on creating a medical advance directive? No - Patient declined No - Patient declined       ? ? ?Current Medications  (verified) ?Outpatient Encounter Medications as of 11/28/2021  ?Medication Sig  ? anastrozole (ARIMIDEX) 1 MG tablet Take 1 tablet (1 mg total) by mouth daily.  ? buPROPion (WELLBUTRIN XL) 150 MG 24 hr tablet Take 1 tablet (150 mg total) by mouth daily.  ? cholecalciferol (VITAMIN D) 1000 UNITS tablet Take 1,000 Units by mouth daily.  ? DULoxetine (CYMBALTA) 30 MG capsule Take 1 capsule (30 mg total) by mouth daily.  ? ELIQUIS 2.5 MG TABS tablet Take 1 tablet by mouth twice daily  ? Omega-3 Fatty Acids (FISH OIL) 1000 MG CAPS Take 1 capsule by mouth daily.  ? pantoprazole (PROTONIX) 40 MG tablet TAKE 1 TABLET BY MOUTH ONCE DAILY AS NEEDED  ? rosuvastatin (CRESTOR) 20 MG tablet Take 1 tablet (20 mg total) by mouth daily.  ? vitamin E 180 MG (400 UNITS) capsule Take 1,200 Units by mouth daily.  ? ?No facility-administered encounter medications on file as of 11/28/2021.  ? ? ?Allergies (verified) ?Xarelto [rivaroxaban]  ? ?History: ?Past Medical History:  ?Diagnosis Date  ? Arthritis 2010  ? OA in knees  ? Breast cancer ([facility] 2016  ? right  ? Breast cancer of upper-inner quadrant of right [place] breast [place] 10/23/2014  ? Carpal tunnel syndrome 02/12/2015  ? Right  ? GERD (gastroesophageal reflux disease)   ? Hyperlipidemia   ? Personal history of radiation therapy   ? Primary localized osteoarthritis of left knee 07/12/2015  ?  Primary localized osteoarthritis of right knee 05/03/2015  ? S/P radiation therapy 12/26/2014 through 01/29/2015                                                     ? Right breast 4800 cGy in 24 sessions                         ? ?Past Surgical History:  ?Procedure Laterality Date  ? BREAST LUMPECTOMY Right 11/13/2014  ? CHOLECYSTECTOMY  2011  ? DILATION AND CURETTAGE OF UTERUS    ? PARTIAL KNEE ARTHROPLASTY Right 05/03/2015  ? Procedure: RIGHT UNICOMPARTMENTAL KNEE;  Surgeon: Marchia Bond, MD;  Location: [place];  Service: Orthopedics;  Laterality: Right;  ? PARTIAL KNEE ARTHROPLASTY  Left 07/12/2015  ? Procedure: LEFT UNICOMPARTMENTAL KNEE;  Surgeon: Marchia Bond, MD;  Location: [facility];  Service: Orthopedics;  Laterality: Left;  ? RADIOACTIVE SEED GUIDED PARTIAL MASTECTOMY WITH AXILLARY SENTINEL LYMPH NODE BIOPSY Right 11/13/2014  ? Procedure: RIGHT PARTIAL MASTECTOMY WITH RADIOACTIVE SEED LOCALIZATION RIGHT  AXILLARY SENTINEL LYMPH NODE BIOPSY;  Surgeon: [name], MD;  Location: [facility];  Service: General;  Laterality: Right;  ? TONSILLECTOMY  1948  ? TUBAL LIGATION    ? ?Family History  ?Problem Relation Age of Onset  ? Hypertension Mother   ? Alcohol abuse Father   ? Breast cancer Maternal Aunt   ? ?Social History  ? ?Socioeconomic History  ? Marital status: Widowed  ?  Spouse name: Not on file  ? Number of children: Not on file  ? Years of education: Not on file  ? Highest education level: Not on file  ?Occupational History  ? Not on file  ?Tobacco Use  ? Smoking status: Former  ?  Packs/day: 1.00  ?  Types: Cigarettes  ?  Quit date: 11/06/1995  ?  Years since quitting: 26.0  ? Smokeless tobacco: Never  ?Substance and Sexual Activity  ? Alcohol use: Yes  ?  Alcohol/week: 2.0 standard drinks  ?  Types: 2 Glasses of wine per week  ?  Comment: occ  ? Drug use: No  ? Sexual activity: Not Currently  ?  Birth control/protection: Post-menopausal  ?Other Topics Concern  ? Not on file  ?Social History Narrative  ? Not on file  ? ?Social Determinants of Health  ? ?Financial Resource Strain: Low Risk   ? Difficulty of Paying Living Expenses: Not hard at all  ?Food Insecurity: No Food Insecurity  ? Worried About Charity fundraiser in the Last Year: Never true  ? Ran Out of Food in the Last Year: Never true  ?Transportation Needs: No Transportation Needs  ? Lack of Transportation (Medical): No  ? Lack of Transportation (Non-Medical): No  ?Physical Activity: Inactive  ? Days of Exercise per Week: 0 days  ? Minutes of Exercise per Session: 0 min  ?Stress: No  Stress Concern Present  ? Feeling of Stress : Only a little  ?Social Connections: Socially Isolated  ? Frequency of Communication with Friends and Family: Twice a week  ? Frequency of Social Gatherings with Friends and Family: Twice a week  ? Attends Religious Services: Never  ? Active Member of Clubs or Organizations: No  ? Attends Archivist Meetings: Never  ?  Marital Status: Widowed  ? ? ?Tobacco Counseling ?Counseling given: Not Answered ? ? ?Clinical Intake: ? ?Pre-visit preparation completed: Yes ? ?Pain : No/denies pain ? ?  ? ?Nutritional Risks: None ?Diabetes: No ? ?How often do you need to have someone help you when you read instructions, pamphlets, or other written materials from your doctor or pharmacy?: 1 - Never ?What is the last grade level you completed in school?: college ? ?Diabetic?no  ? ?Interpreter Needed?: No ? ?Information entered by :: [email or web address] ? ? ?Activities of Daily Living ? ?  11/28/2021  ?  9:30 AM  ?In your present state of health, do you have any difficulty performing the following activities:  ?Hearing? 0  ?Vision? 0  ?Difficulty concentrating or making decisions? 0  ?Walking or climbing stairs? 0  ?Dressing or bathing? 0  ?Doing errands, shopping? 0  ?Preparing Food and eating ? N  ?Using the Toilet? N  ?In the past six months, have you accidently leaked urine? N  ?Do you have problems with loss of bowel control? N  ?Managing your Medications? N  ?Managing your Finances? N  ?Housekeeping or managing your Housekeeping? N  ? ? ?Patient Care Team: ?[name], MD as PCP - General (Internal Medicine) ?[name], MD as Consulting Physician (General Surgery) ?[name], MD as Consulting Physician (Hematology and Oncology) ?[name], MD (Inactive) as Consulting Physician (Radiation Oncology) ?Mauro Kaufmann, RN as Registered Nurse ?Rockwell Germany, RN as Registered Nurse ?Holley Bouche, NP (Inactive) as Nurse Practitioner (Nurse Practitioner) ?Marchia Bond, MD as Consulting Physician (Orthopedic Surgery) ?Sylvan Cheese, NP as Nurse Practitioner (Hematology and Oncology) ?Stefan Church, OD as Consulting Physician (Optometry) ?Szabat, Darnelle Maffucci,

## 2021-11-28 NOTE — Telephone Encounter (Signed)
Pt called in to report that she was prescibed DULoxetine (CYMBALTA) 30 MG capsule.  ? ?She experienced adverse side effe cts of feeling light headed and started to have body shakes.  ? ?Please call to discuss symptoms and to give an alternative.  ?

## 2021-11-28 NOTE — Patient Instructions (Signed)
Ms. Aicher , ?Thank you for taking time to come for your Medicare Wellness Visit. I appreciate your ongoing commitment to your health goals. Please review the following plan we discussed and let me know if I can assist you in the future.  ? ?Screening recommendations/referrals: ?Colonoscopy: no longer required ?Mammogram: no longer required  ?Bone Density: 03/10/2019 ?Recommended yearly ophthalmology/optometry visit for glaucoma screening and checkup ?Recommended yearly dental visit for hygiene and checkup ? ?Vaccinations: ?Influenza vaccine: completed  ?Pneumococcal vaccine: completed  ?Tdap vaccine: 10/03/2014 ?Shingles vaccine: completed    ? ?Advanced directives: none  ? ?Conditions/risks identified: none  ? ?Next appointment: none  ? ? ?Preventive Care 33 Years and Older, Female ?Preventive care refers to lifestyle choices and visits with your health care provider that can promote health and wellness. ?What does preventive care include? ?A yearly physical exam. This is also called an annual well check. ?Dental exams once or twice a year. ?Routine eye exams. Ask your health care provider how often you should have your eyes checked. ?Personal lifestyle choices, including: ?Daily care of your teeth and gums. ?Regular physical activity. ?Eating a healthy diet. ?Avoiding tobacco and drug use. ?Limiting alcohol use. ?Practicing safe sex. ?Taking low-dose aspirin every day. ?Taking vitamin and mineral supplements as recommended by your health care provider. ?What happens during an annual well check? ?The services and screenings done by your health care provider during your annual well check will depend on your age, overall health, lifestyle risk factors, and family history of disease. ?Counseling  ?Your health care provider may ask you questions about your: ?Alcohol use. ?Tobacco use. ?Drug use. ?Emotional well-being. ?Home and relationship well-being. ?Sexual activity. ?Eating habits. ?History of falls. ?Memory and  ability to understand (cognition). ?Work and work Statistician. ?Reproductive health. ?Screening  ?You may have the following tests or measurements: ?Height, weight, and BMI. ?Blood pressure. ?Lipid and cholesterol levels. These may be checked every 5 years, or more frequently if you are over 75 years old. ?Skin check. ?Lung cancer screening. You may have this screening every year starting at age 64 if you have a 30-pack-year history of smoking and currently smoke or have quit within the past 15 years. ?Fecal occult blood test (FOBT) of the stool. You may have this test every year starting at age 92. ?Flexible sigmoidoscopy or colonoscopy. You may have a sigmoidoscopy every 5 years or a colonoscopy every 10 years starting at age 7. ?Hepatitis C blood test. ?Hepatitis B blood test. ?Sexually transmitted disease (STD) testing. ?Diabetes screening. This is done by checking your blood sugar (glucose) after you have not eaten for a while (fasting). You may have this done every 1-3 years. ?Bone density scan. This is done to screen for osteoporosis. You may have this done starting at age 69. ?Mammogram. This may be done every 1-2 years. Talk to your health care provider about how often you should have regular mammograms. ?Talk with your health care provider about your test results, treatment options, and if necessary, the need for more tests. ?Vaccines  ?Your health care provider may recommend certain vaccines, such as: ?Influenza vaccine. This is recommended every year. ?Tetanus, diphtheria, and acellular pertussis (Tdap, Td) vaccine. You may need a Td booster every 10 years. ?Zoster vaccine. You may need this after age 90. ?Pneumococcal 13-valent conjugate (PCV13) vaccine. One dose is recommended after age 65. ?Pneumococcal polysaccharide (PPSV23) vaccine. One dose is recommended after age 34. ?Talk to your health care provider about which screenings and vaccines you  need and how often you need them. ?This information is  not intended to replace advice given to you by your health care provider. Make sure you discuss any questions you have with your health care provider. ?Document Released: 08/09/2015 Document Revised: 04/01/2016 Document Reviewed: 05/14/2015 ?Elsevier Interactive Patient Education ? 2017 [place]. ? ?Fall Prevention in the Home ?Falls can cause injuries. They can happen to people of all ages. There are many things you can do to make your home safe and to help prevent falls. ?What can I do on the outside of my home? ?Regularly fix the edges of walkways and driveways and fix any cracks. ?Remove anything that might make you trip as you walk through a door, such as a raised step or threshold. ?Trim any bushes or trees on the path to your home. ?Use bright outdoor lighting. ?Clear any walking paths of anything that might make someone trip, such as rocks or tools. ?Regularly check to see if handrails are loose or broken. Make sure that both sides of any steps have handrails. ?Any raised decks and porches should have guardrails on the edges. ?Have any leaves, snow, or ice cleared regularly. ?Use sand or salt on walking paths during winter. ?Clean up any spills in your garage right away. This includes oil or grease spills. ?What can I do in the bathroom? ?Use night lights. ?Install grab bars by the toilet and in the tub and shower. Do not use towel bars as grab bars. ?Use non-skid mats or decals in the tub or shower. ?If you need to sit down in the shower, use a plastic, non-slip stool. ?Keep the floor dry. Clean up any water that spills on the floor as soon as it happens. ?Remove soap buildup in the tub or shower regularly. ?Attach bath mats securely with double-sided non-slip rug tape. ?Do not have throw rugs and other things on the floor that can make you trip. ?What can I do in the bedroom? ?Use night lights. ?Make sure that you have a light by your bed that is easy to reach. ?Do not use any sheets or blankets that  are too big for your bed. They should not hang down onto the floor. ?Have a firm chair that has side arms. You can use this for support while you get dressed. ?Do not have throw rugs and other things on the floor that can make you trip. ?What can I do in the kitchen? ?Clean up any spills right away. ?Avoid walking on wet floors. ?Keep items that you use a lot in easy-to-reach places. ?If you need to reach something above you, use a strong step stool that has a grab bar. ?Keep electrical cords out of the way. ?Do not use floor polish or wax that makes floors slippery. If you must use wax, use non-skid floor wax. ?Do not have throw rugs and other things on the floor that can make you trip. ?What can I do with my stairs? ?Do not leave any items on the stairs. ?Make sure that there are handrails on both sides of the stairs and use them. Fix handrails that are broken or loose. Make sure that handrails are as long as the stairways. ?Check any carpeting to make sure that it is firmly attached to the stairs. Fix any carpet that is loose or worn. ?Avoid having throw rugs at the top or bottom of the stairs. If you do have throw rugs, attach them to the floor with carpet tape. ?Make sure that  you have a light switch at the top of the stairs and the bottom of the stairs. If you do not have them, ask someone to add them for you. ?What else can I do to help prevent falls? ?Wear shoes that: ?Do not have high heels. ?Have rubber bottoms. ?Are comfortable and fit you well. ?Are closed at the toe. Do not wear sandals. ?If you use a stepladder: ?Make sure that it is fully opened. Do not climb a closed stepladder. ?Make sure that both sides of the stepladder are locked into place. ?Ask someone to hold it for you, if possible. ?Clearly mark and make sure that you can see: ?Any grab bars or handrails. ?First and last steps. ?Where the edge of each step is. ?Use tools that help you move around (mobility aids) if they are needed. These  include: ?Canes. ?Walkers. ?Scooters. ?Crutches. ?Turn on the lights when you go into a dark area. Replace any light bulbs as soon as they burn out. ?Set up your furniture so you have a clear path. Avoid m

## 2021-12-01 ENCOUNTER — Other Ambulatory Visit: Payer: Self-pay | Admitting: Internal Medicine

## 2021-12-01 DIAGNOSIS — F322 Major depressive disorder, single episode, severe without psychotic features: Secondary | ICD-10-CM

## 2021-12-01 MED ORDER — VORTIOXETINE HBR 5 MG PO TABS: 5.0000 mg | ORAL_TABLET | Freq: Every day | ORAL | 0 refills | Status: DC

## 2021-12-01 NOTE — Telephone Encounter (Signed)
Patient would like someone to call her and let her know what medication he switched her to and something about the medication.  She has several questions ?

## 2021-12-01 NOTE — Telephone Encounter (Signed)
Pt has been informed that Trintellix was Rx'd in place of the Cymbalta. She expressed understanding. ?

## 2021-12-03 ENCOUNTER — Other Ambulatory Visit: Payer: Self-pay | Admitting: Internal Medicine

## 2021-12-03 DIAGNOSIS — F322 Major depressive disorder, single episode, severe without psychotic features: Secondary | ICD-10-CM

## 2021-12-03 MED ORDER — BUPROPION HCL ER (XL) 300 MG PO TB24: 300.0000 mg | ORAL_TABLET | Freq: Every day | ORAL | 1 refills | Status: DC

## 2021-12-03 NOTE — Telephone Encounter (Signed)
PT calls today in regards to their Trintellix. PT said she had some questions regarding it but wanted to express these with a CMA! ? ?CB: [phone] ?

## 2021-12-03 NOTE — Telephone Encounter (Signed)
Pt stated that she has read up on Trintellix and stated that is is for major depressive disorders and stated "I do not have that". She is questioning why that was Rx'd and not having a dose increase of the Wellbutrin since that works for her. Please advise your rationale on you decision.  ? ?

## 2021-12-05 ENCOUNTER — Other Ambulatory Visit: Payer: Self-pay | Admitting: Internal Medicine

## 2021-12-05 DIAGNOSIS — D6859 Other primary thrombophilia: Secondary | ICD-10-CM

## 2021-12-05 DIAGNOSIS — I82562 Chronic embolism and thrombosis of left calf muscular vein: Secondary | ICD-10-CM

## 2021-12-16 ENCOUNTER — Ambulatory Visit
Admission: RE | Admit: 2021-12-16 | Discharge: 2021-12-16 | Disposition: A | Discharge status: Home / Self Care | Payer: Self-pay | Source: Ambulatory Visit | Attending: Internal Medicine | Admitting: Internal Medicine

## 2021-12-16 ENCOUNTER — Other Ambulatory Visit: Payer: Self-pay | Admitting: Internal Medicine

## 2021-12-16 DIAGNOSIS — R0609 Other forms of dyspnea: Secondary | ICD-10-CM

## 2021-12-17 ENCOUNTER — Encounter: Payer: Self-pay | Admitting: Internal Medicine

## 2021-12-19 ENCOUNTER — Other Ambulatory Visit: Payer: Self-pay | Admitting: Internal Medicine

## 2021-12-23 ENCOUNTER — Encounter: Payer: Self-pay | Admitting: Internal Medicine

## 2021-12-24 ENCOUNTER — Ambulatory Visit: Payer: Medicare Other | Admitting: Internal Medicine

## 2021-12-24 ENCOUNTER — Encounter: Payer: Self-pay | Admitting: Internal Medicine

## 2021-12-24 VITALS — BP 128/82 | HR 83 | Temp 98.4°F | Ht 66.0 in | Wt 228.0 lb

## 2021-12-24 DIAGNOSIS — K625 Hemorrhage of anus and rectum: Secondary | ICD-10-CM | POA: Diagnosis not present

## 2021-12-24 DIAGNOSIS — K644 Residual hemorrhoidal skin tags: Secondary | ICD-10-CM | POA: Insufficient documentation

## 2021-12-24 DIAGNOSIS — K219 Gastro-esophageal reflux disease without esophagitis: Secondary | ICD-10-CM | POA: Diagnosis not present

## 2021-12-24 DIAGNOSIS — K7581 Nonalcoholic steatohepatitis (NASH): Secondary | ICD-10-CM

## 2021-12-24 LAB — CBC WITH DIFFERENTIAL/PLATELET
Basophils Absolute: 0.1 10*3/uL (ref 0.0–0.1)
Basophils Relative: 0.8 % (ref 0.0–3.0)
Eosinophils Absolute: 0.1 10*3/uL (ref 0.0–0.7)
Eosinophils Relative: 2 % (ref 0.0–5.0)
HCT: 39.2 % (ref 36.0–46.0)
Hemoglobin: 13 g/dL (ref 12.0–15.0)
Lymphocytes Relative: 26.1 % (ref 12.0–46.0)
Lymphs Abs: 1.8 10*3/uL (ref 0.7–4.0)
MCHC: 33.2 g/dL (ref 30.0–36.0)
MCV: 96.2 fl (ref 78.0–100.0)
Monocytes Absolute: 0.7 10*3/uL (ref 0.1–1.0)
Monocytes Relative: 10 % (ref 3.0–12.0)
Neutro Abs: 4.2 10*3/uL (ref 1.4–7.7)
Neutrophils Relative %: 61.1 % (ref 43.0–77.0)
Platelets: 245 10*3/uL (ref 150.0–400.0)
RBC: 4.08 Mil/uL (ref 3.87–5.11)
RDW: 13.8 % (ref 11.5–15.5)
WBC: 6.9 10*3/uL (ref 4.0–10.5)

## 2021-12-24 NOTE — Patient Instructions (Signed)

## 2021-12-24 NOTE — Progress Notes (Unsigned)
Subjective:  Patient ID: [name], female    DOB: 1941/07/08  Age: 81 y.o. MRN: [id number]  CC: No chief complaint on file.   HPI FOSTER SONNIER presents for ***  Outpatient Medications Prior to Visit  Medication Sig Dispense Refill   anastrozole (ARIMIDEX) 1 MG tablet Take 1 tablet (1 mg total) by mouth daily. 90 tablet 3   buPROPion (WELLBUTRIN XL) 300 MG 24 hr tablet Take 1 tablet (300 mg total) by mouth daily. 90 tablet 1   cholecalciferol (VITAMIN D) 1000 UNITS tablet Take 1,000 Units by mouth daily.     ELIQUIS 2.5 MG TABS tablet Take 1 tablet by mouth twice daily 180 tablet 1   Omega-3 Fatty Acids (FISH OIL) 1000 MG CAPS Take 1 capsule by mouth daily.     pantoprazole (PROTONIX) 40 MG tablet TAKE 1 TABLET BY MOUTH ONCE DAILY AS NEEDED 90 tablet 0   rosuvastatin (CRESTOR) 20 MG tablet Take 1 tablet (20 mg total) by mouth daily. 90 tablet 1   vitamin E 180 MG (400 UNITS) capsule Take 1,200 Units by mouth daily.     No facility-administered medications prior to visit.    ROS Review of Systems  Objective:  BP 128/82 (BP Location: Left Arm, Patient Position: Sitting, Cuff Size: Large)   Pulse 83   Temp 98.4 F (36.9 C) (Oral)   Ht 5' 6"  (1.676 m)   Wt 228 lb (103.4 kg)   SpO2 94%   BMI 36.80 kg/m   BP Readings from Last 3 Encounters:  12/24/21 128/82  11/03/21 134/78  03/24/21 (!) 133/55    Wt Readings from Last 3 Encounters:  12/24/21 228 lb (103.4 kg)  11/03/21 230 lb (104.3 kg)  03/24/21 217 lb 8 oz (98.7 kg)    Physical Exam Exam conducted with a chaperone present Rodman Pickle).  Abdominal:     General: Abdomen is protuberant. Bowel sounds are normal. There is no distension.     Palpations: Abdomen is soft. There is no hepatomegaly, splenomegaly or mass.     Tenderness: There is no abdominal tenderness.  Genitourinary:    Rectum: Guaiac result negative. External hemorrhoid and internal hemorrhoid present. No tenderness or anal fissure. Normal anal  tone.    Lab Results  Component Value Date   WBC 7.2 11/03/2021   HGB 13.5 11/03/2021   HCT 40.7 11/03/2021   PLT 235.0 11/03/2021   GLUCOSE 116 (H) 11/03/2021   CHOL 183 11/03/2021   TRIG 110.0 11/03/2021   HDL 66.00 11/03/2021   LDLDIRECT 158.4 07/17/2011   LDLCALC 95 11/03/2021   ALT 34 11/03/2021   AST 31 11/03/2021   NA 140 11/03/2021   K 4.5 11/03/2021   CL 105 11/03/2021   CREATININE 0.83 11/03/2021   BUN 21 11/03/2021   CO2 28 11/03/2021   TSH 2.36 11/03/2021   HGBA1C 6.1 11/03/2021    CT CARDIAC SCORING (SELF PAY ONLY)  Addendum Date: 12/16/2021   ADDENDUM REPORT: 12/16/2021 14:18 CLINICAL DATA:  Cardiovascular Disease Risk stratification EXAM: Coronary Calcium Score TECHNIQUE: A gated, non-contrast computed tomography scan of the heart was performed using 75m slice thickness. Axial images were analyzed on a dedicated workstation. Calcium scoring of the coronary arteries was performed using the Agatston method. FINDINGS: Coronary arteries: Normal origins. Coronary Calcium Score: Left main: 0 Left anterior descending artery: 179 Left circumflex artery: 81 Right coronary artery: 75 Total: 335 Percentile: 74th Pericardium: Normal. Aorta: Normal caliber of ascending aorta. Aortic atherosclerosis noted.  Mitral annular calcification present. Non-cardiac: See separate report from [facility]. IMPRESSION: Coronary calcium score of 335. This was 74th percentile for age-, race-, and sex-matched controls. Aortic atherosclerosis. RECOMMENDATIONS: Coronary artery calcium (CAC) score is a strong predictor of incident coronary heart disease (CHD) and provides predictive information beyond traditional risk factors. CAC scoring is reasonable to use in the decision to withhold, postpone, or initiate statin therapy in intermediate-risk or selected borderline-risk asymptomatic adults (age 60-75 years and LDL-C >=70 to <190 mg/dL) who do not have diabetes or established atherosclerotic  cardiovascular disease (ASCVD).* In intermediate-risk (10-year ASCVD risk >=7.5% to <20%) adults or selected borderline-risk (10-year ASCVD risk >=5% to <7.5%) adults in whom a CAC score is measured for the purpose of making a treatment decision the following recommendations have been made: If CAC=0, it is reasonable to withhold statin therapy and reassess in 5 to 10 years, as long as higher risk conditions are absent (diabetes mellitus, family history of premature CHD in first degree relatives (males <55 years; females <65 years), cigarette smoking, or LDL >=190 mg/dL). If CAC is 1 to 99, it is reasonable to initiate statin therapy for patients >=56 years of age. If CAC is >=100 or >=75th percentile, it is reasonable to initiate statin therapy at any age. Cardiology referral should be considered for patients with CAC scores >=400 or >=75th percentile. *2018 AHA/ACC/AACVPR/AAPA/ABC/ACPM/ADA/AGS/APhA/ASPC/NLA/PCNA Guideline on the Management of Blood Cholesterol: A Report of the American College of Cardiology/American Heart Association Task Force on Clinical Practice Guidelines. J Am Coll Cardiol. 2019;73(24):3168-3209. Buford Dresser, MD Electronically Signed   By: Buford Dresser M.D.   On: 12/16/2021 14:18   Result Date: 12/16/2021 : OVER-READ INTERPRETATION  CT CHEST The following report is an over-read performed by radiologist Dr. Minerva Fester [facility] on 12/16/2021. This over-read does not include interpretation of cardiac or coronary anatomy or pathology. The coronary artery calcium score interpretation by the cardiologist is attached. Imaging of the chest is focused on cardiac structures and excludes much of the chest on CT. COMPARISON: None available FINDINGS: Cardiovascular: There is aortic atherosclerosis with calcification. Please see dedicated report for further detail regarding cardiac structures. Mediastinum/Nodes: No acute process or adenopathy in the visualized  portions of the mediastinum. Lungs/Pleura: Moderate to marked pulmonary emphysema that is worse in the upper portion of the lungs imaged on today's study. Chest is incompletely imaged. No consolidation or effusion. Visualized airways are patent. Upper Abdomen: Liver density would suggest at least moderate hepatic steatosis without reference organ for assessment on the current study. Only the uppermost portion of the abdomen is evaluated. Musculoskeletal: No acute or destructive bone findings. IMPRESSION: Signs of at least moderate hepatic steatosis. Spleen as a reference organ is not imaged on today's evaluation, correlate with any laboratory or clinical evidence of liver disease. Aortic atherosclerosis. Aortic Atherosclerosis (ICD10-I70.0). Electronically Signed: By: Zetta Bills M.D. On: 12/16/2021 12:03    Assessment & Plan:   Diagnoses and all orders for this visit:  BRBPR (bright red blood per rectum) -     CBC with Differential/Platelet; Future  Gastroesophageal reflux disease without esophagitis -     CBC with Differential/Platelet; Future  NASH (nonalcoholic steatohepatitis)  External hemorrhoid, bleeding -     Ambulatory referral to General Surgery   I am having Anastasia A. Smyre maintain her cholecalciferol, Fish Oil, anastrozole, vitamin E, rosuvastatin, buPROPion, Eliquis, and pantoprazole.  No orders of the defined types were placed in this encounter.    Follow-up: Return if  symptoms worsen or fail to improve.  [name], MD

## 2022-01-20 DIAGNOSIS — K642 Third degree hemorrhoids: Secondary | ICD-10-CM | POA: Diagnosis not present

## 2022-01-25 ENCOUNTER — Other Ambulatory Visit: Payer: Self-pay | Admitting: Internal Medicine

## 2022-01-25 DIAGNOSIS — F322 Major depressive disorder, single episode, severe without psychotic features: Secondary | ICD-10-CM

## 2022-02-02 ENCOUNTER — Ambulatory Visit: Payer: Medicare Other | Admitting: Internal Medicine

## 2022-02-02 DIAGNOSIS — M1711 Unilateral primary osteoarthritis, right knee: Secondary | ICD-10-CM | POA: Diagnosis not present

## 2022-02-17 DIAGNOSIS — K642 Third degree hemorrhoids: Secondary | ICD-10-CM | POA: Diagnosis not present

## 2022-02-24 DIAGNOSIS — H35363 Drusen (degenerative) of macula, bilateral: Secondary | ICD-10-CM | POA: Diagnosis not present

## 2022-02-25 ENCOUNTER — Telehealth: Payer: Self-pay | Admitting: Hematology and Oncology

## 2022-02-25 NOTE — Telephone Encounter (Signed)
Rescheduled appointment per provider PAL. Patient is aware of the changes made to her upcoming appointment.

## 2022-03-02 ENCOUNTER — Telehealth: Payer: Medicare Other

## 2022-03-14 ENCOUNTER — Other Ambulatory Visit: Payer: Self-pay | Admitting: Hematology and Oncology

## 2022-03-14 DIAGNOSIS — C50211 Malignant neoplasm of upper-inner quadrant of right female breast: Secondary | ICD-10-CM

## 2022-03-16 DIAGNOSIS — K641 Second degree hemorrhoids: Secondary | ICD-10-CM | POA: Diagnosis not present

## 2022-03-22 ENCOUNTER — Other Ambulatory Visit: Payer: Self-pay | Admitting: Internal Medicine

## 2022-03-22 DIAGNOSIS — F322 Major depressive disorder, single episode, severe without psychotic features: Secondary | ICD-10-CM

## 2022-03-23 ENCOUNTER — Inpatient Hospital Stay: Payer: Medicare Other | Admitting: Hematology and Oncology

## 2022-03-23 ENCOUNTER — Other Ambulatory Visit: Payer: Self-pay | Admitting: Hematology and Oncology

## 2022-03-23 DIAGNOSIS — Z1231 Encounter for screening mammogram for malignant neoplasm of breast: Secondary | ICD-10-CM

## 2022-03-26 ENCOUNTER — Encounter: Payer: Self-pay | Admitting: Internal Medicine

## 2022-03-26 ENCOUNTER — Other Ambulatory Visit: Payer: Self-pay | Admitting: Internal Medicine

## 2022-03-26 DIAGNOSIS — F322 Major depressive disorder, single episode, severe without psychotic features: Secondary | ICD-10-CM

## 2022-03-28 ENCOUNTER — Other Ambulatory Visit: Payer: Self-pay | Admitting: Internal Medicine

## 2022-03-28 DIAGNOSIS — F322 Major depressive disorder, single episode, severe without psychotic features: Secondary | ICD-10-CM

## 2022-03-28 MED ORDER — BUPROPION HCL ER (XL) 150 MG PO TB24: 150.0000 mg | ORAL_TABLET | Freq: Every day | ORAL | 1 refills | Status: DC

## 2022-03-31 ENCOUNTER — Ambulatory Visit
Admission: RE | Admit: 2022-03-31 | Discharge: 2022-03-31 | Disposition: A | Discharge status: Home / Self Care | Payer: Medicare Other | Source: Ambulatory Visit | Attending: Hematology and Oncology | Admitting: Hematology and Oncology

## 2022-03-31 DIAGNOSIS — Z1231 Encounter for screening mammogram for malignant neoplasm of breast: Secondary | ICD-10-CM | POA: Diagnosis not present

## 2022-04-01 ENCOUNTER — Telehealth: Payer: Self-pay | Admitting: Hematology and Oncology

## 2022-04-01 ENCOUNTER — Inpatient Hospital Stay: Payer: Medicare Other | Admitting: Hematology and Oncology

## 2022-04-01 NOTE — Assessment & Plan Note (Deleted)
Rt Lumpectomy 11/14/14: IDC Grade 3, 2.7 cm size, 0/3 LN , Margins clear; T2N0 Stage IIA Right breast invasive ductal carcinoma diagnosed with screening mammogram, 3.1 cm by ultrasound in the upper quadrant 6 cm from the nipple, grade 2-3, EF 100%, PR 48%, HER-2 negative ratio 0.6, Ki-67 was 28%, Oncotype DX score 19, 12% risk of recurrence, status post radiation 4800 cGy in 24 sessions completed 01/29/2015  Treatment plan: antiestrogen therapy with anastrozole 1 mg daily started 02/14/2015, plan for 7 years. She wants to take it longer but I am concerned about her bone density and her inability to take bisphosphonates  Anastrozole toxicities: Tolerating it very well without any side effects  Breast Cancer Surveillance: 1. Breast exam9/12/2021:Benign 2. Mammograms  03/31/2022: Results are pending 3. Bone density:03/10/2019: T score -2.5: Osteoporosis  Osteoporosis: Recommended calcium vitamin D and bisphosphonate therapy.She has not taken bisphosphonates because of dental issues   Return to clinic in 1 year for follow-up

## 2022-04-01 NOTE — Telephone Encounter (Signed)
R/s per 9/6 in basket, pt has been called and confirmed appt

## 2022-04-25 ENCOUNTER — Other Ambulatory Visit: Payer: Self-pay | Admitting: Internal Medicine

## 2022-04-25 DIAGNOSIS — E785 Hyperlipidemia, unspecified: Secondary | ICD-10-CM

## 2022-04-26 ENCOUNTER — Other Ambulatory Visit: Payer: Self-pay | Admitting: Internal Medicine

## 2022-04-26 DIAGNOSIS — E785 Hyperlipidemia, unspecified: Secondary | ICD-10-CM

## 2022-04-28 ENCOUNTER — Other Ambulatory Visit: Payer: Self-pay | Admitting: Internal Medicine

## 2022-04-28 ENCOUNTER — Telehealth: Payer: Self-pay | Admitting: Internal Medicine

## 2022-04-28 DIAGNOSIS — E785 Hyperlipidemia, unspecified: Secondary | ICD-10-CM

## 2022-04-29 ENCOUNTER — Ambulatory Visit: Payer: Medicare Other | Admitting: Family Medicine

## 2022-04-30 NOTE — Assessment & Plan Note (Deleted)
Rt Lumpectomy 11/14/14: IDC Grade 3, 2.7 cm size, 0/3 LN , Margins clear; T2N0 Stage IIA Right breast invasive ductal carcinoma diagnosed with screening mammogram, 3.1 cm by ultrasound in the upper quadrant 6 cm from the nipple, grade 2-3, EF 100%, PR 48%, HER-2 negative ratio 0.6, Ki-67 was 28%, Oncotype DX score 19, 12% risk of recurrence, status post radiation 4800 cGy in 24 sessions completed 01/29/2015  Treatment plan: antiestrogen therapy with anastrozole 1 mg daily started 02/14/2015, plan for 7 years. She wants to take it longer but I am concerned about her bone density and her inability to take bisphosphonates  Anastrozole toxicities: Tolerating it very well without any side effects  Breast Cancer Surveillance: 1. Breast exam10/12/2021:Benign 2. Mammograms  04/01/2022:No concern for cancer, breast density categoryC 3. Bone density:03/10/2019: T score -2.5: Osteoporosis  Osteoporosis: Recommended calcium vitamin D and bisphosphonate therapy.I sent a prescription for Fosamax. But she hasnot taken it because of dental issues   Return to clinic in 1 year for follow-up

## 2022-05-01 ENCOUNTER — Inpatient Hospital Stay: Payer: Medicare Other | Admitting: Hematology and Oncology

## 2022-05-01 DIAGNOSIS — C50211 Malignant neoplasm of upper-inner quadrant of right female breast: Secondary | ICD-10-CM

## 2022-06-01 ENCOUNTER — Telehealth: Payer: Medicare Other

## 2022-06-05 ENCOUNTER — Other Ambulatory Visit: Payer: Self-pay | Admitting: Internal Medicine

## 2022-07-29 ENCOUNTER — Other Ambulatory Visit: Payer: Self-pay | Admitting: Internal Medicine

## 2022-07-29 DIAGNOSIS — I82562 Chronic embolism and thrombosis of left calf muscular vein: Secondary | ICD-10-CM

## 2022-07-29 DIAGNOSIS — D6859 Other primary thrombophilia: Secondary | ICD-10-CM

## 2022-07-30 ENCOUNTER — Other Ambulatory Visit: Payer: Self-pay | Admitting: Internal Medicine

## 2022-07-30 ENCOUNTER — Encounter: Payer: Self-pay | Admitting: Internal Medicine

## 2022-07-30 ENCOUNTER — Telehealth: Payer: Self-pay | Admitting: Internal Medicine

## 2022-07-30 DIAGNOSIS — D6859 Other primary thrombophilia: Secondary | ICD-10-CM

## 2022-07-30 DIAGNOSIS — I82562 Chronic embolism and thrombosis of left calf muscular vein: Secondary | ICD-10-CM

## 2022-07-30 MED ORDER — APIXABAN 2.5 MG PO TABS: 2.5000 mg | ORAL_TABLET | Freq: Two times a day (BID) | ORAL | 0 refills | Status: DC

## 2022-07-30 NOTE — Telephone Encounter (Signed)
Pt had questions about her ELIQUIS 2.5 MG TABS tablet RX.   Please call pt at:  [phone]

## 2022-07-30 NOTE — Telephone Encounter (Signed)
See MyChart message.  Duplicate request.

## 2022-08-06 ENCOUNTER — Ambulatory Visit: Payer: Medicare Other | Admitting: Internal Medicine

## 2022-08-27 ENCOUNTER — Other Ambulatory Visit: Payer: Self-pay | Admitting: Internal Medicine

## 2022-08-27 DIAGNOSIS — I82562 Chronic embolism and thrombosis of left calf muscular vein: Secondary | ICD-10-CM

## 2022-08-27 DIAGNOSIS — D6859 Other primary thrombophilia: Secondary | ICD-10-CM

## 2022-09-02 ENCOUNTER — Encounter: Payer: Self-pay | Admitting: Internal Medicine

## 2022-09-02 ENCOUNTER — Ambulatory Visit (INDEPENDENT_AMBULATORY_CARE_PROVIDER_SITE_OTHER): Payer: Medicare Other | Admitting: Internal Medicine

## 2022-09-02 VITALS — BP 122/68 | HR 85 | Temp 98.7°F | Resp 16 | Ht 66.0 in | Wt 230.0 lb

## 2022-09-02 DIAGNOSIS — K7581 Nonalcoholic steatohepatitis (NASH): Secondary | ICD-10-CM

## 2022-09-02 DIAGNOSIS — D6859 Other primary thrombophilia: Secondary | ICD-10-CM

## 2022-09-02 DIAGNOSIS — R7303 Prediabetes: Secondary | ICD-10-CM | POA: Diagnosis not present

## 2022-09-02 DIAGNOSIS — K219 Gastro-esophageal reflux disease without esophagitis: Secondary | ICD-10-CM

## 2022-09-02 DIAGNOSIS — Z23 Encounter for immunization: Secondary | ICD-10-CM

## 2022-09-02 DIAGNOSIS — I82562 Chronic embolism and thrombosis of left calf muscular vein: Secondary | ICD-10-CM

## 2022-09-02 LAB — BASIC METABOLIC PANEL
BUN: 18 mg/dL (ref 6–23)
CO2: 26 mEq/L (ref 19–32)
Calcium: 10 mg/dL (ref 8.4–10.5)
Chloride: 106 mEq/L (ref 96–112)
Creatinine, Ser: 1 mg/dL (ref 0.40–1.20)
GFR: 53.43 mL/min — ABNORMAL LOW (ref >60.00)
Glucose, Bld: 109 mg/dL — ABNORMAL HIGH (ref 70–99)
Potassium: 4.4 mEq/L (ref 3.5–5.1)
Sodium: 141 mEq/L (ref 135–145)

## 2022-09-02 LAB — CBC WITH DIFFERENTIAL/PLATELET
Basophils Absolute: 0.1 10*3/uL (ref 0.0–0.1)
Basophils Relative: 0.7 % (ref 0.0–3.0)
Eosinophils Absolute: 0.3 10*3/uL (ref 0.0–0.7)
Eosinophils Relative: 3.5 % (ref 0.0–5.0)
HCT: 42.3 % (ref 36.0–46.0)
Hemoglobin: 14.3 g/dL (ref 12.0–15.0)
Lymphocytes Relative: 28.1 % (ref 12.0–46.0)
Lymphs Abs: 2.2 10*3/uL (ref 0.7–4.0)
MCHC: 33.7 g/dL (ref 30.0–36.0)
MCV: 95.3 fl (ref 78.0–100.0)
Monocytes Absolute: 0.8 10*3/uL (ref 0.1–1.0)
Monocytes Relative: 9.8 % (ref 3.0–12.0)
Neutro Abs: 4.6 10*3/uL (ref 1.4–7.7)
Neutrophils Relative %: 57.9 % (ref 43.0–77.0)
Platelets: 285 10*3/uL (ref 150.0–400.0)
RBC: 4.44 Mil/uL (ref 3.87–5.11)
RDW: 13.8 % (ref 11.5–15.5)
WBC: 8 10*3/uL (ref 4.0–10.5)

## 2022-09-02 LAB — HEPATIC FUNCTION PANEL
ALT: 33 U/L (ref 0–35)
AST: 30 U/L (ref 0–37)
Albumin: 4.6 g/dL (ref 3.5–5.2)
Alkaline Phosphatase: 101 U/L (ref 39–117)
Bilirubin, Direct: 0.1 mg/dL (ref 0.0–0.3)
Total Bilirubin: 0.5 mg/dL (ref 0.2–1.2)
Total Protein: 7.8 g/dL (ref 6.0–8.3)

## 2022-09-02 LAB — HEMOGLOBIN A1C: Hgb A1c MFr Bld: 6.1 % (ref 4.6–6.5)

## 2022-09-02 MED ORDER — APIXABAN 2.5 MG PO TABS: 2.5000 mg | ORAL_TABLET | Freq: Two times a day (BID) | ORAL | 1 refills | Status: DC

## 2022-09-02 NOTE — Patient Instructions (Signed)
Rivaroxaban Tablets What is this medication? RIVAROXABAN (ri va ROX a ban) prevents or treats blood clots. It is also used to lower the risk of stroke in people with AFib (atrial fibrillation). It can be used to lower the risk of heart attack or stroke in people with heart or peripheral artery disease. It belongs to a group of medications called blood thinners. This medicine may be used for other purposes; ask your health care provider or pharmacist if you have questions. COMMON BRAND NAME(S): Xarelto, Xarelto Starter Pack What should I tell my care team before I take this medication? They need to know if you have any of these conditions: Antiphospholipid antibody syndrome Bleeding disorders Bleeding in the brain Blood clots Kidney disease Liver disease Prosthetic heart valve Recent or planned spinal or epidural procedure Stomach bleeding Take medications that treat or prevent blood clots An unusual or allergic reaction to rivaroxaban, other medications, foods, dyes, or preservatives Pregnant or trying to get pregnant Breast-feeding How should I use this medication? Take this medication by mouth. For your therapy to work as well as possible, take each dose exactly as prescribed on the prescription label. Do not skip doses. Skipping doses or stopping this medication can increase your risk of a blood clot or stroke. Keep taking this medication unless your care team tells you to stop. If you are taking this medication after hip or knee replacement surgery, take it with or without food. If you are taking this medication for atrial fibrillation, take it with your evening meal. If you are taking this medication to treat blood clots, take it with food at the same time each day. If you are taking this medication for coronary artery disease or peripheral artery disease, take it with or without food at the same time every day. If you are unable to swallow your tablet, you may crush the tablet and mix it  in applesauce. Then, immediately eat the applesauce. You should eat more food right after you eat the applesauce containing the crushed tablet. A special MedGuide will be given to you by the pharmacist with each prescription and refill. Be sure to read this information carefully each time. Talk to your care team about the use of this medication in children. While it may be prescribed for children as young as newborns for selected conditions, precautions do apply. Overdosage: If you think you have taken too much of this medicine contact a poison control center or emergency room at once. NOTE: This medicine is only for you. Do not share this medicine with others. What if I miss a dose? If you take your medication once a day and miss a dose, take it as soon as you can. If it is almost time for your next dose, take only that dose. Do not take double or extra doses. If you are taking this medication twice a day to treat blood clots and miss a dose, take the missed dose as soon as you remember. In this instance, 2 tablets may be taken at the same time. The next day you should take 1 tablet twice a day. If you are taking this medication twice a day for coronary artery disease or peripheral artery disease and miss a dose, skip it. Take your next dose at the normal time. Do not take extra or 2 doses at the same time to make up for the missed dose. What may interact with this medication? Do not take this medication with any of the following: Defibrotide This  medication may also interact with the following: Aspirin and aspirin-like medications Certain antibiotics like erythromycin and clarithromycin Certain medications for fungal infections like ketoconazole and itraconazole Certain medications for seizures like carbamazepine, phenytoin Certain medications that treat or prevent blood clots like warfarin, enoxaparin, dalteparin, apixaban, dabigatran, and edoxaban Conivaptan Indinavir Lopinavir;  ritonavir NSAIDS, medications for pain and inflammation, like ibuprofen or naproxen Rifampin Ritonavir SNRIs, medications for depression, like desvenlafaxine, duloxetine, levomilnacipran, venlafaxine SSRIs, medications for depression, like citalopram, escitalopram, fluoxetine, fluvoxamine, paroxetine, sertraline St. John's wort This list may not describe all possible interactions. Give your health care provider a list of all the medicines, herbs, non-prescription drugs, or dietary supplements you use. Also tell them if you smoke, drink alcohol, or use illegal drugs. Some items may interact with your medicine. What should I watch for while using this medication? Visit your care team for regular checks on your progress. You may need blood work done while you are taking this medication. Your condition will be monitored carefully while you are receiving this medication. It is important not to miss any appointments. Avoid sports and activities that might cause injury while you are using this medication. Severe falls or injuries can cause unseen bleeding. Be careful when using sharp tools or knives. Consider using an Copy. Take special care brushing or flossing your teeth. Report any injuries, bruising, or red spots on the skin to your care team. Wear a medical ID bracelet or chain. Carry a card that describes your condition. List the medications and doses you take on the card. Tell your dentist and dental surgeon that you are taking this medication. You should not have major dental surgery while on this medication. See your dentist to have a dental exam and fix any dental problems before starting this medication. Take good care of your teeth while on this medication. Make sure you see your dentist for regular follow-up appointments. If you are going to need surgery or other procedure, tell your care team that you are using this medication. Do not become pregnant while taking this medication. Women  should inform their care team if they wish to become pregnant or think they might be pregnant. There is potential for serious harm to an unborn child. Talk to your care team for more information. What side effects may I notice from receiving this medication? Side effects that you should report to your care team as soon as possible: Allergic reactions--skin rash, itching, hives, swelling of the face, lips, tongue, or throat Bleeding--bloody or black, tar-like stools, vomiting blood or brown material that looks like coffee grounds, red or dark brown urine, small red or purple spots on skin, unusual bruising or bleeding Bleeding in the brain--severe headache, stiff neck, confusion, dizziness, change in vision, numbness or weakness of the face, arm, or leg, trouble speaking, trouble walking, vomiting Heavy periods This list may not describe all possible side effects. Call your doctor for medical advice about side effects. You may report side effects to FDA at 1-800-FDA-1088. Where should I keep my medication? Keep out of the reach of children and pets. Store at room temperature between 20 and 25 degrees C (68 and 77 degrees F). Get rid of any unused medication after the expiration date. To get rid of medications that are no longer needed or have expired: Take the medication to a medication take-back program. Check your pharmacy or law enforcement to find a location. If you cannot return the medication, check the label or package insert to see  if the medication should be thrown out in the garbage or flushed down the toilet. If you are not sure, ask your care team. If it is safe to put it in the trash, empty the medication out of the container. Mix the medication with cat litter, dirt, coffee grounds, or other unwanted substance. Seal the mixture in a bag or container. Put it in the trash. NOTE: This sheet is a summary. It may not cover all possible information. If you have questions about this medicine,  talk to your doctor, pharmacist, or health care provider.  2023 Elsevier/Gold Standard (2020-07-09 00:00:00)

## 2022-09-02 NOTE — Progress Notes (Signed)
Subjective:  Patient ID: Stacey Navarro, female    DOB: [birth year]  Age: 82 y.o. MRN: [id number]  CC: Gastroesophageal Reflux   HPI NAYA STRANO presents for f/up -  She exercises on a recumbent bike and has good endurance.  She denies DOE, chest pain, shortness of breath, or edema.  Outpatient Medications Prior to Visit  Medication Sig Dispense Refill   anastrozole (ARIMIDEX) 1 MG tablet Take 1 tablet by mouth once daily 90 tablet 0   buPROPion (WELLBUTRIN XL) 150 MG 24 hr tablet Take 1 tablet (150 mg total) by mouth daily. 90 tablet 1   cholecalciferol (VITAMIN D) 1000 UNITS tablet Take 1,000 Units by mouth daily.     Omega-3 Fatty Acids (FISH OIL) 1000 MG CAPS Take 1 capsule by mouth daily.     pantoprazole (PROTONIX) 40 MG tablet TAKE 1 TABLET BY MOUTH ONCE DAILY AS NEEDED 90 tablet 0   rosuvastatin (CRESTOR) 20 MG tablet Take 1 tablet by mouth once daily 90 tablet 0   ELIQUIS 2.5 MG TABS tablet Take 1 tablet by mouth twice daily 60 tablet 0   vitamin E 180 MG (400 UNITS) capsule Take 1,200 Units by mouth daily.     No facility-administered medications prior to visit.    ROS Review of Systems  Constitutional:  Negative for chills, diaphoresis, fatigue and fever.  HENT: Negative.  Negative for nosebleeds.   Eyes: Negative.   Respiratory:  Negative for cough, chest tightness, shortness of breath and wheezing.   Cardiovascular:  Negative for chest pain, palpitations and leg swelling.  Gastrointestinal:  Negative for abdominal pain, blood in stool, diarrhea, nausea and vomiting.  Endocrine: Negative.   Genitourinary: Negative.  Negative for difficulty urinating and hematuria.  Musculoskeletal: Negative.  Negative for arthralgias and myalgias.  Skin: Negative.   Neurological: Negative.  Negative for dizziness, weakness and headaches.  Hematological:  Negative for adenopathy. Does not bruise/bleed easily.  Psychiatric/Behavioral: Negative.      Objective:  BP 122/68  (BP Location: Left Arm, Patient Position: Sitting, Cuff Size: Large)   Pulse 85   Temp 98.7 F (37.1 C) (Oral)   Resp 16   Ht 5' 6"$  (1.676 m)   Wt 230 lb (104.3 kg)   SpO2 93%   BMI 37.12 kg/m   BP Readings from Last 3 Encounters:  09/02/22 122/68  12/24/21 128/82  11/03/21 134/78    Wt Readings from Last 3 Encounters:  09/02/22 230 lb (104.3 kg)  12/24/21 228 lb (103.4 kg)  11/03/21 230 lb (104.3 kg)    Physical Exam Vitals reviewed.  Constitutional:      Appearance: She is obese. She is not ill-appearing.  HENT:     Nose: Nose normal.     Mouth/Throat:     Mouth: Mucous membranes are moist.  Eyes:     General: No scleral icterus.    Conjunctiva/sclera: Conjunctivae normal.  Cardiovascular:     Rate and Rhythm: Normal rate and regular rhythm.     Heart sounds: No murmur heard.    No friction rub. No gallop.  Pulmonary:     Effort: Pulmonary effort is normal.     Breath sounds: No stridor. No wheezing, rhonchi or rales.  Abdominal:     General: Abdomen is protuberant. Bowel sounds are normal. There is no distension.     Palpations: Abdomen is soft. There is no hepatomegaly, splenomegaly or mass.     Tenderness: There is no abdominal tenderness.  Musculoskeletal:  General: Normal range of motion.     Cervical back: Neck supple.     Right lower leg: No edema.     Left lower leg: No edema.  Lymphadenopathy:     Cervical: No cervical adenopathy.  Skin:    General: Skin is warm and dry.     Coloration: Skin is not pale.  Neurological:     General: No focal deficit present.     Mental Status: She is alert. Mental status is at baseline.  Psychiatric:        Mood and Affect: Mood normal.        Behavior: Behavior normal.     Lab Results  Component Value Date   WBC 8.0 09/02/2022   HGB 14.3 09/02/2022   HCT 42.3 09/02/2022   PLT 285.0 09/02/2022   GLUCOSE 109 (H) 09/02/2022   CHOL 183 11/03/2021   TRIG 110.0 11/03/2021   HDL 66.00 11/03/2021    LDLDIRECT 158.4 07/17/2011   LDLCALC 95 11/03/2021   ALT 33 09/02/2022   AST 30 09/02/2022   NA 141 09/02/2022   K 4.4 09/02/2022   CL 106 09/02/2022   CREATININE 1.00 09/02/2022   BUN 18 09/02/2022   CO2 26 09/02/2022   TSH 2.36 11/03/2021   HGBA1C 6.1 09/02/2022    MM 3D SCREEN BREAST BILATERAL  Result Date: 04/01/2022 CLINICAL DATA:  Screening. EXAM: DIGITAL SCREENING BILATERAL MAMMOGRAM WITH TOMOSYNTHESIS AND CAD TECHNIQUE: Bilateral screening digital craniocaudal and mediolateral oblique mammograms were obtained. Bilateral screening digital breast tomosynthesis was performed. The images were evaluated with computer-aided detection. COMPARISON:  Previous exam(s). ACR Breast Density Category c: The breast tissue is heterogeneously dense, which may obscure small masses. FINDINGS: There are no findings suspicious for malignancy. IMPRESSION: No mammographic evidence of malignancy. A result letter of this screening mammogram will be mailed directly to the patient. RECOMMENDATION: Screening mammogram in one year. (Code:[id number]-B-01Y) BI-RADS CATEGORY  1: Negative. Electronically Signed   By: Lovey Newcomer M.D.   On: 04/01/2022 08:21    Assessment & Plan:   Tamsyn was seen today for gastroesophageal reflux.  Diagnoses and all orders for this visit:  NASH (nonalcoholic steatohepatitis)- LFTs are stable. -     Hepatic function panel; Future -     Hepatic function panel  Hypercoagulable state, primary [facility])- She has had no recurrent thrombotic events.  Will continue the DOAC. -     CBC with Differential/Platelet; Future -     Basic metabolic panel; Future -     apixaban (ELIQUIS) 2.5 MG TABS tablet; Take 1 tablet (2.5 mg total) by mouth 2 (two) times daily. -     Basic metabolic panel -     CBC with Differential/Platelet  Prediabetes -     Basic metabolic panel; Future -     Hemoglobin A1c; Future -     Hemoglobin A1c -     Basic metabolic panel  Gastroesophageal reflux disease without  esophagitis- Her symptoms are well-controlled. -     CBC with Differential/Platelet; Future -     CBC with Differential/Platelet  Other orders -     Pneumococcal polysaccharide vaccine 23-valent greater than or equal to 2yo subcutaneous/IM   I have discontinued Joycelyn Schmid A. Cwik's vitamin E. I have also changed her Eliquis to apixaban. Additionally, I am having her maintain her cholecalciferol, Fish Oil, anastrozole, buPROPion, rosuvastatin, and pantoprazole.  Meds ordered this encounter  Medications   apixaban (ELIQUIS) 2.5 MG TABS tablet    Sig:  Take 1 tablet (2.5 mg total) by mouth 2 (two) times daily.    Dispense:  180 tablet    Refill:  1     Follow-up: Return in about 6 months (around 03/03/2023).  [name], MD

## 2022-09-04 ENCOUNTER — Encounter: Payer: Self-pay | Admitting: Internal Medicine

## 2022-09-22 ENCOUNTER — Ambulatory Visit: Payer: Medicare Other | Admitting: Internal Medicine

## 2022-09-25 ENCOUNTER — Other Ambulatory Visit: Payer: Self-pay | Admitting: Internal Medicine

## 2022-09-25 DIAGNOSIS — F322 Major depressive disorder, single episode, severe without psychotic features: Secondary | ICD-10-CM

## 2022-11-12 ENCOUNTER — Other Ambulatory Visit: Payer: Self-pay | Admitting: Internal Medicine

## 2022-11-24 ENCOUNTER — Telehealth: Payer: Self-pay

## 2022-11-24 NOTE — Telephone Encounter (Signed)
Contacted Stacey Navarro to schedule their annual wellness visit. Appointment made for 12/07/22.

## 2022-12-07 ENCOUNTER — Ambulatory Visit (INDEPENDENT_AMBULATORY_CARE_PROVIDER_SITE_OTHER): Payer: Medicare Other

## 2022-12-07 VITALS — Ht 66.0 in | Wt 230.0 lb

## 2022-12-07 DIAGNOSIS — Z0000 Encounter for general adult medical examination without abnormal findings: Secondary | ICD-10-CM

## 2022-12-07 NOTE — Patient Instructions (Signed)
Stacey Navarro , Thank you for taking time to come for your Medicare Wellness Visit. I appreciate your ongoing commitment to your health goals. Please review the following plan we discussed and let me know if I can assist you in the future.   These are the goals we discussed:  Goals      My goal for 2024 is to get back to exercise routine at the [facility].        This is a list of the screening recommended for you and due dates:  Health Maintenance  Topic Date Due   COVID-19 Vaccine (5 - 2023-24 season) 03/27/2022   Flu Shot  02/25/2023   Medicare Annual Wellness Visit  12/07/2023   DTaP/Tdap/Td vaccine (2 - Td or Tdap) 10/02/2024   Pneumonia Vaccine  Completed   DEXA scan (bone density measurement)  Completed   Zoster (Shingles) Vaccine  Completed   HPV Vaccine  Aged Out    Advanced directives: No; Advance directive discussed with you today. Even though you declined this today please call our office should you change your mind and we can give you the proper paperwork for you to fill out.  Conditions/risks identified: Yes  Next appointment: Follow up in one year for your annual wellness visit.   Preventive Care 35 Years and Older, Female Preventive care refers to lifestyle choices and visits with your health care provider that can promote health and wellness. What does preventive care include? A yearly physical exam. This is also called an annual well check. Dental exams once or twice a year. Routine eye exams. Ask your health care provider how often you should have your eyes checked. Personal lifestyle choices, including: Daily care of your teeth and gums. Regular physical activity. Eating a healthy diet. Avoiding tobacco and drug use. Limiting alcohol use. Practicing safe sex. Taking low-dose aspirin every day. Taking vitamin and mineral supplements as recommended by your health care provider. What happens during an annual well check? The services and screenings done by your  health care provider during your annual well check will depend on your age, overall health, lifestyle risk factors, and family history of disease. Counseling  Your health care provider may ask you questions about your: Alcohol use. Tobacco use. Drug use. Emotional well-being. Home and relationship well-being. Sexual activity. Eating habits. History of falls. Memory and ability to understand (cognition). Work and work Astronomer. Reproductive health. Screening  You may have the following tests or measurements: Height, weight, and BMI. Blood pressure. Lipid and cholesterol levels. These may be checked every 5 years, or more frequently if you are over 26 years old. Skin check. Lung cancer screening. You may have this screening every year starting at age 72 if you have a 30-pack-year history of smoking and currently smoke or have quit within the past 15 years. Fecal occult blood test (FOBT) of the stool. You may have this test every year starting at age 50. Flexible sigmoidoscopy or colonoscopy. You may have a sigmoidoscopy every 5 years or a colonoscopy every 10 years starting at age 53. Hepatitis C blood test. Hepatitis B blood test. Sexually transmitted disease (STD) testing. Diabetes screening. This is done by checking your blood sugar (glucose) after you have not eaten for a while (fasting). You may have this done every 1-3 years. Bone density scan. This is done to screen for osteoporosis. You may have this done starting at age 81. Mammogram. This may be done every 1-2 years. Talk to your health care provider  about how often you should have regular mammograms. Talk with your health care provider about your test results, treatment options, and if necessary, the need for more tests. Vaccines  Your health care provider may recommend certain vaccines, such as: Influenza vaccine. This is recommended every year. Tetanus, diphtheria, and acellular pertussis (Tdap, Td) vaccine. You may  need a Td booster every 10 years. Zoster vaccine. You may need this after age 68. Pneumococcal 13-valent conjugate (PCV13) vaccine. One dose is recommended after age 31. Pneumococcal polysaccharide (PPSV23) vaccine. One dose is recommended after age 64. Talk to your health care provider about which screenings and vaccines you need and how often you need them. This information is not intended to replace advice given to you by your health care provider. Make sure you discuss any questions you have with your health care provider. Document Released: 08/09/2015 Document Revised: 04/01/2016 Document Reviewed: 05/14/2015 Elsevier Interactive Patient Education  2017 [place] Prevention in the Home Falls can cause injuries. They can happen to people of all ages. There are many things you can do to make your home safe and to help prevent falls. What can I do on the outside of my home? Regularly fix the edges of walkways and driveways and fix any cracks. Remove anything that might make you trip as you walk through a door, such as a raised step or threshold. Trim any bushes or trees on the path to your home. Use bright outdoor lighting. Clear any walking paths of anything that might make someone trip, such as rocks or tools. Regularly check to see if handrails are loose or broken. Make sure that both sides of any steps have handrails. Any raised decks and porches should have guardrails on the edges. Have any leaves, snow, or ice cleared regularly. Use sand or salt on walking paths during winter. Clean up any spills in your garage right away. This includes oil or grease spills. What can I do in the bathroom? Use night lights. Install grab bars by the toilet and in the tub and shower. Do not use towel bars as grab bars. Use non-skid mats or decals in the tub or shower. If you need to sit down in the shower, use a plastic, non-slip stool. Keep the floor dry. Clean up any water that spills on  the floor as soon as it happens. Remove soap buildup in the tub or shower regularly. Attach bath mats securely with double-sided non-slip rug tape. Do not have throw rugs and other things on the floor that can make you trip. What can I do in the bedroom? Use night lights. Make sure that you have a light by your bed that is easy to reach. Do not use any sheets or blankets that are too big for your bed. They should not hang down onto the floor. Have a firm chair that has side arms. You can use this for support while you get dressed. Do not have throw rugs and other things on the floor that can make you trip. What can I do in the kitchen? Clean up any spills right away. Avoid walking on wet floors. Keep items that you use a lot in easy-to-reach places. If you need to reach something above you, use a strong step stool that has a grab bar. Keep electrical cords out of the way. Do not use floor polish or wax that makes floors slippery. If you must use wax, use non-skid floor wax. Do not have throw rugs and  other things on the floor that can make you trip. What can I do with my stairs? Do not leave any items on the stairs. Make sure that there are handrails on both sides of the stairs and use them. Fix handrails that are broken or loose. Make sure that handrails are as long as the stairways. Check any carpeting to make sure that it is firmly attached to the stairs. Fix any carpet that is loose or worn. Avoid having throw rugs at the top or bottom of the stairs. If you do have throw rugs, attach them to the floor with carpet tape. Make sure that you have a light switch at the top of the stairs and the bottom of the stairs. If you do not have them, ask someone to add them for you. What else can I do to help prevent falls? Wear shoes that: Do not have high heels. Have rubber bottoms. Are comfortable and fit you well. Are closed at the toe. Do not wear sandals. If you use a stepladder: Make sure  that it is fully opened. Do not climb a closed stepladder. Make sure that both sides of the stepladder are locked into place. Ask someone to hold it for you, if possible. Clearly mark and make sure that you can see: Any grab bars or handrails. First and last steps. Where the edge of each step is. Use tools that help you move around (mobility aids) if they are needed. These include: Canes. Walkers. Scooters. Crutches. Turn on the lights when you go into a dark area. Replace any light bulbs as soon as they burn out. Set up your furniture so you have a clear path. Avoid moving your furniture around. If any of your floors are uneven, fix them. If there are any pets around you, be aware of where they are. Review your medicines with your doctor. Some medicines can make you feel dizzy. This can increase your chance of falling. Ask your doctor what other things that you can do to help prevent falls. This information is not intended to replace advice given to you by your health care provider. Make sure you discuss any questions you have with your health care provider. Document Released: 05/09/2009 Document Revised: 12/19/2015 Document Reviewed: 08/17/2014 Elsevier Interactive Patient Education  2017 [employer].

## 2022-12-07 NOTE — Progress Notes (Signed)
I connected with  Rosezella Rumpf on 12/07/22 by a audio enabled telemedicine application and verified that I am speaking with the correct person using two identifiers.  Patient Location: Home  Provider Location: Office/Clinic  I discussed the limitations of evaluation and management by telemedicine. The patient expressed understanding and agreed to proceed.  Subjective:   Stacey Navarro is a 82 y.o. female who presents for Medicare Annual (Subsequent) preventive examination.  Review of Systems     Cardiac Risk Factors include: advanced age (>67men, >59 women);dyslipidemia;family history of premature cardiovascular disease;sedentary lifestyle;obesity (BMI >30kg/m2)     Objective:    Today's Vitals   12/07/22 1604  Weight: 230 lb (104.3 kg)  Height: 5\' 6"  (1.676 m)  PainSc: 0-No pain   Body mass index is 37.12 kg/m.     12/07/2022    4:06 PM 11/28/2021    9:27 AM 11/20/2020    2:42 PM 06/23/2016   10:26 AM 03/16/2016    4:56 PM 12/10/2015    9:57 AM 07/12/2015    6:25 AM  Advanced Directives  Does Patient Have a Medical Advance Directive? No No No Yes Yes No Yes  Type of Agricultural consultant;Living will  Healthcare Power of [place];Living will  Does patient want to make changes to medical advance directive?     No - Patient declined  No - Patient declined  Copy of Healthcare Power of Attorney in Chart?     Yes  Yes  Would patient like information on creating a medical advance directive? No - Patient declined No - Patient declined No - Patient declined        Current Medications (verified) Outpatient Encounter Medications as of 12/07/2022  Medication Sig   anastrozole (ARIMIDEX) 1 MG tablet Take 1 tablet by mouth once daily   apixaban (ELIQUIS) 2.5 MG TABS tablet Take 1 tablet (2.5 mg total) by mouth 2 (two) times daily.   buPROPion (WELLBUTRIN XL) 150 MG 24 hr tablet Take 1 tablet by mouth once daily   cholecalciferol (VITAMIN D) 1000  UNITS tablet Take 1,000 Units by mouth daily.   Omega-3 Fatty Acids (FISH OIL) 1000 MG CAPS Take 1 capsule by mouth daily.   pantoprazole (PROTONIX) 40 MG tablet TAKE 1 TABLET BY MOUTH ONCE DAILY AS NEEDED   rosuvastatin (CRESTOR) 20 MG tablet Take 1 tablet by mouth once daily   No facility-administered encounter medications on file as of 12/07/2022.    Allergies (verified) Xarelto [rivaroxaban]   History: Past Medical History:  Diagnosis Date   Arthritis 2010   OA in knees   Breast cancer (HCC) 2016   right   Breast cancer of upper-inner quadrant of right female breast (HCC) 10/23/2014   Carpal tunnel syndrome 02/12/2015   Right   GERD (gastroesophageal reflux disease)    Hyperlipidemia    Personal history of radiation therapy    Primary localized osteoarthritis of left knee 07/12/2015   Primary localized osteoarthritis of right knee 05/03/2015   S/P radiation therapy 12/26/2014 through 01/29/2015                                                      Right breast 4800 cGy in 24 sessions  Past Surgical History:  Procedure Laterality Date   BREAST LUMPECTOMY Right 11/13/2014   CHOLECYSTECTOMY  2011   DILATION AND CURETTAGE OF UTERUS     PARTIAL KNEE ARTHROPLASTY Right 05/03/2015   Procedure: RIGHT UNICOMPARTMENTAL KNEE;  Surgeon: Teryl Lucy, MD;  Location: [facility];  Service: Orthopedics;  Laterality: Right;   PARTIAL KNEE ARTHROPLASTY Left 07/12/2015   Procedure: LEFT UNICOMPARTMENTAL KNEE;  Surgeon: Teryl Lucy, MD;  Location: [facility];  Service: Orthopedics;  Laterality: Left;   RADIOACTIVE SEED GUIDED PARTIAL MASTECTOMY WITH AXILLARY SENTINEL LYMPH NODE BIOPSY Right 11/13/2014   Procedure: RIGHT PARTIAL MASTECTOMY WITH RADIOACTIVE SEED LOCALIZATION RIGHT  AXILLARY SENTINEL LYMPH NODE BIOPSY;  Surgeon: Claud Kelp, MD;  Location: [facility];  Service: General;  Laterality: Right;   TONSILLECTOMY  1948    TUBAL LIGATION     Family History  Problem Relation Age of Onset   Hypertension Mother    Alcohol abuse Father    Breast cancer Maternal Aunt    Social History   Socioeconomic History   Marital status: Widowed    Spouse name: Not on file   Number of children: Not on file   Years of education: Not on file   Highest education level: Not on file  Occupational History   Not on file  Tobacco Use   Smoking status: Former    Packs/day: 1    Types: Cigarettes    Quit date: 11/06/1995    Years since quitting: 27.1   Smokeless tobacco: Never  Substance and Sexual Activity   Alcohol use: Yes    Alcohol/week: 21.0 standard drinks of alcohol    Types: 14 Glasses of wine, 7 Cans of beer per week   Drug use: No   Sexual activity: Not Currently    Birth control/protection: Post-menopausal  Other Topics Concern   Not on file  Social History Narrative   Not on file   Social Determinants of Health   Financial Resource Strain: Low Risk  (12/07/2022)   Overall Financial Resource Strain (CARDIA)    Difficulty of Paying Living Expenses: Not hard at all  Food Insecurity: No Food Insecurity (12/07/2022)   Hunger Vital Sign    Worried About Running Out of Food in the Last Year: Never true    Ran Out of Food in the Last Year: Never true  Transportation Needs: No Transportation Needs (12/07/2022)   PRAPARE - Administrator, Civil Service (Medical): No    Lack of Transportation (Non-Medical): No  Physical Activity: Inactive (12/07/2022)   Exercise Vital Sign    Days of Exercise per Week: 0 days    Minutes of Exercise per Session: 0 min  Stress: No Stress Concern Present (12/07/2022)   [employer] of Occupational Health - Occupational Stress Questionnaire    Feeling of Stress : Not at all  Social Connections: Socially Isolated (12/07/2022)   Social Connection and Isolation Panel [NHANES]    Frequency of Communication with Friends and Family: Twice a week    Frequency of  Social Gatherings with Friends and Family: Twice a week    Attends Religious Services: Never    Database administrator or Organizations: No    Attends Banker Meetings: Never    Marital Status: Widowed    Tobacco Counseling Counseling given: Not Answered   Clinical Intake:  Pre-visit preparation completed: Yes  Pain : No/denies pain Pain Score: 0-No pain     BMI -  recorded: 37.12 Nutritional Status: BMI > 30  Obese Nutritional Risks: None Diabetes: No  How often do you need to have someone help you when you read instructions, pamphlets, or other written materials from your doctor or pharmacy?: 1 - Never What is the last grade level you completed in school?: HSG  Diabetic? No  Interpreter Needed?: No  Information entered by :: Susie Cassette, LPN.   Activities of Daily Living    12/07/2022    4:09 PM  In your present state of health, do you have any difficulty performing the following activities:  Hearing? 0  Vision? 0  Difficulty concentrating or making decisions? 0  Walking or climbing stairs? 0  Dressing or bathing? 0  Doing errands, shopping? 0  Preparing Food and eating ? N  Using the Toilet? N  In the past six months, have you accidently leaked urine? N  Do you have problems with loss of bowel control? N  Managing your Medications? N  Managing your Finances? N  Housekeeping or managing your Housekeeping? N    Patient Care Team: Etta Grandchild, MD as PCP - General (Internal Medicine) Claud Kelp, MD as Consulting Physician (General Surgery) Serena Croissant, MD as Consulting Physician (Hematology and Oncology) Chipper Herb, MD (Inactive) as Consulting Physician (Radiation Oncology) Pershing Proud, RN as Registered Nurse Donnelly Angelica, RN as Registered Nurse Hubbard Hartshorn, NP (Inactive) as Nurse Practitioner (Nurse Practitioner) Teryl Lucy, MD as Consulting Physician (Orthopedic Surgery) Salomon Fick, NP as  Nurse Practitioner (Hematology and Oncology) Sena Hitch, OD as Consulting Physician (Optometry) Szabat, Vinnie Level, [facility] (Inactive) (Pharmacist)  Indicate any recent Medical Services you may have received from other than [facility] providers in the past year (date may be approximate).     Assessment:   This is a routine wellness examination for [facility].  Hearing/Vision screen Hearing Screening - Comments:: Denies hearing difficulties.  Vision Screening - Comments:: Wears rx glasses - up to date with routine eye exams with Melissa Heisler, OD.   Dietary issues and exercise activities discussed: Current Exercise Habits: The patient does not participate in regular exercise at present, Exercise limited by: orthopedic condition(s)   Goals Addressed             This Visit's Progress    My goal for 2024 is to get back to exercise routine at the [facility].        Depression Screen    12/07/2022    4:08 PM 09/02/2022    2:05 PM 12/24/2021   11:27 AM 11/28/2021    9:27 AM 11/28/2021    9:26 AM 11/20/2020    2:41 PM 01/30/2019   11:37 AM  PHQ 2/9 Scores  PHQ - 2 Score 0 0 1 0 0 0 0  PHQ- 9 Score 1 0 2        Fall Risk    12/07/2022    4:07 PM 11/28/2021    9:27 AM 11/20/2020    2:42 PM 01/30/2019   11:37 AM 07/21/2017    4:56 PM  Fall Risk   Falls in the past year? 0 0 0 0 No  Number falls in past yr: 0 0 0 0   Injury with Fall? 0 0 0 0   Risk for fall due to : No Fall Risks  No Fall Risks    Follow up Falls prevention discussed Falls evaluation completed Falls evaluation completed Falls evaluation completed     FALL RISK PREVENTION PERTAINING TO THE  HOME:  Any stairs in or around the home? No  If so, are there any without handrails? No  Home free of loose throw rugs in walkways, pet beds, electrical cords, etc? Yes  Adequate lighting in your home to reduce risk of falls? Yes   ASSISTIVE DEVICES UTILIZED TO PREVENT FALLS:  Life alert? No  Use of a cane, walker or w/c? No  Grab bars  in the bathroom? Yes  Shower chair or bench in shower? No  Elevated toilet seat or a handicapped toilet? Yes   TIMED UP AND GO:  Was the test performed? No . Telephonic Visit  Cognitive Function:        12/07/2022    4:16 PM  6CIT Screen  What Year? 0 points  What month? 0 points  What time? 0 points  Count back from 20 0 points  Months in reverse 0 points  Repeat phrase 0 points  Total Score 0 points    Immunizations Immunization History  Administered Date(s) Administered   PFIZER(Purple Top)SARS-COV-2 Vaccination 09/02/2019, 09/27/2019, 05/02/2020, 06/26/2021   Pneumococcal Conjugate-13 10/03/2014   Pneumococcal Polysaccharide-23 03/16/2016, 09/02/2022   Tdap 10/03/2014   Zoster Recombinat (Shingrix) 01/19/2018, 06/17/2018   Zoster, Live 03/16/2016    TDAP status: Up to date  Flu Vaccine status: Declined, Education has been provided regarding the importance of this vaccine but patient still declined. Advised may receive this vaccine at local pharmacy or Health Dept. Aware to provide a copy of the vaccination record if obtained from local pharmacy or Health Dept. Verbalized acceptance and understanding.  Pneumococcal vaccine status: Up to date  Covid-19 vaccine status: Completed vaccines  Qualifies for Shingles Vaccine? Yes   Zostavax completed Yes   Shingrix Completed?: Yes  Screening Tests Health Maintenance  Topic Date Due   COVID-19 Vaccine (5 - 2023-24 season) 03/27/2022   INFLUENZA VACCINE  02/25/2023   Medicare Annual Wellness (AWV)  12/07/2023   DTaP/Tdap/Td (2 - Td or Tdap) 10/02/2024   Pneumonia Vaccine 34+ Years old  Completed   DEXA SCAN  Completed   Zoster Vaccines- Shingrix  Completed   HPV VACCINES  Aged Out    Health Maintenance  Health Maintenance Due  Topic Date Due   COVID-19 Vaccine (5 - 2023-24 season) 03/27/2022    Colorectal cancer screening: No longer required.   Mammogram status: Completed 03/31/2022. Repeat every  year  Bone Density status: Completed 03/10/2019. Results reflect: Bone density results: OSTEOPOROSIS. Repeat every 2 years.  Lung Cancer Screening: (Low Dose CT Chest recommended if Age 1-80 years, 30 pack-year currently smoking OR have quit w/in 15years.) does not qualify.   Lung Cancer Screening Referral: no  Additional Screening:  Hepatitis C Screening: does not qualify; Completed: no  Vision Screening: Recommended annual ophthalmology exams for early detection of glaucoma and other disorders of the eye. Is the patient up to date with their annual eye exam?  Yes  Who is the provider or what is the name of the office in which the patient attends annual eye exams? Melissa Heisler, OD. If pt is not established with a provider, would they like to be referred to a provider to establish care? No .   Dental Screening: Recommended annual dental exams for proper oral hygiene  Community Resource Referral / Chronic Care Management: CRR required this visit?  No   CCM required this visit?  No      Plan:     I have personally reviewed and noted the following in the  patient's chart:   Medical and social history Use of alcohol, tobacco or illicit drugs  Current medications and supplements including opioid prescriptions. Patient is not currently taking opioid prescriptions. Functional ability and status Nutritional status Physical activity Advanced directives List of other physicians Hospitalizations, surgeries, and ER visits in previous 12 months Vitals Screenings to include cognitive, depression, and falls Referrals and appointments  In addition, I have reviewed and discussed with patient certain preventive protocols, quality metrics, and best practice recommendations. A written personalized care plan for preventive services as well as general preventive health recommendations were provided to patient.     Mickeal Needy, LPN   1/61/0960   Nurse Notes:  Normal cognitive  status assessed by direct observation telephone conversation by this Nurse Health Advisor. No abnormalities found.

## 2022-12-19 ENCOUNTER — Other Ambulatory Visit: Payer: Self-pay | Admitting: Internal Medicine

## 2022-12-19 DIAGNOSIS — F322 Major depressive disorder, single episode, severe without psychotic features: Secondary | ICD-10-CM

## 2023-01-01 DIAGNOSIS — G5601 Carpal tunnel syndrome, right upper limb: Secondary | ICD-10-CM | POA: Diagnosis not present

## 2023-01-01 DIAGNOSIS — M170 Bilateral primary osteoarthritis of knee: Secondary | ICD-10-CM | POA: Diagnosis not present

## 2023-02-06 ENCOUNTER — Other Ambulatory Visit: Payer: Self-pay | Admitting: Internal Medicine

## 2023-02-14 ENCOUNTER — Other Ambulatory Visit: Payer: Self-pay | Admitting: Hematology and Oncology

## 2023-02-14 DIAGNOSIS — C50211 Malignant neoplasm of upper-inner quadrant of right female breast: Secondary | ICD-10-CM

## 2023-02-15 ENCOUNTER — Telehealth: Payer: Self-pay

## 2023-02-15 NOTE — Telephone Encounter (Signed)
Called Pt regarding anastrozole rx refill request. Per MD last note, Pt completed 7 years of tx in 01/2022. Pt has not been seen in office since 2022, a scheduler will be in touch to schedule Pt for yearly follow up. Pt verbalized understanding. Scheduling message sent.

## 2023-02-17 ENCOUNTER — Other Ambulatory Visit: Payer: Self-pay | Admitting: Hematology and Oncology

## 2023-02-17 DIAGNOSIS — Z1231 Encounter for screening mammogram for malignant neoplasm of breast: Secondary | ICD-10-CM

## 2023-02-22 ENCOUNTER — Telehealth: Payer: Self-pay | Admitting: Hematology and Oncology

## 2023-02-22 NOTE — Telephone Encounter (Signed)
Patient is aware of upcoming appointment times/date

## 2023-03-21 ENCOUNTER — Other Ambulatory Visit: Payer: Self-pay | Admitting: Internal Medicine

## 2023-03-21 DIAGNOSIS — F322 Major depressive disorder, single episode, severe without psychotic features: Secondary | ICD-10-CM

## 2023-04-02 ENCOUNTER — Other Ambulatory Visit: Payer: Self-pay | Admitting: Internal Medicine

## 2023-04-02 DIAGNOSIS — E785 Hyperlipidemia, unspecified: Secondary | ICD-10-CM

## 2023-04-05 ENCOUNTER — Ambulatory Visit
Admission: RE | Admit: 2023-04-05 | Discharge: 2023-04-05 | Disposition: A | Discharge status: Home / Self Care | Payer: Medicare Other | Source: Ambulatory Visit | Attending: Hematology and Oncology | Admitting: Hematology and Oncology

## 2023-04-05 DIAGNOSIS — Z1231 Encounter for screening mammogram for malignant neoplasm of breast: Secondary | ICD-10-CM

## 2023-04-06 NOTE — Progress Notes (Signed)
Patient Care Team: Etta Grandchild, MD as PCP - General (Internal Medicine) Claud Kelp, MD as Consulting Physician (General Surgery) Serena Croissant, MD as Consulting Physician (Hematology and Oncology) Chipper Herb, MD (Inactive) as Consulting Physician (Radiation Oncology) Pershing Proud, RN as Registered Nurse Donnelly Angelica, RN as Registered Nurse Hubbard Hartshorn, NP (Inactive) as Nurse Practitioner (Nurse Practitioner) Teryl Lucy, MD as Consulting Physician (Orthopedic Surgery) Salomon Fick, NP as Nurse Practitioner (Hematology and Oncology) Sena Hitch, OD as Consulting Physician (Optometry) Szabat, Vinnie Level, [facility] (Inactive) (Pharmacist)  DIAGNOSIS:  Encounter Diagnoses  Name Primary?   Malignant neoplasm of upper-inner quadrant of right breast in female, estrogen receptor positive (HCC) Yes   Malignant neoplasm of upper-inner quadrant of right female breast, unspecified estrogen receptor status (HCC)     SUMMARY OF ONCOLOGIC HISTORY: Oncology History  Breast cancer of upper-inner quadrant of right female breast (HCC)  10/19/2014 Mammogram   Right breast mass 3.1 cm upper quadrant, 6 cm from the nipple   10/19/2014 Initial Biopsy   Right breast core needle bx: invasive ductal carcinoma, grade 2-3, ER+ (100%), PR+ (48%), HER-2 negative (ratio 0.6), Ki-67 28%   10/20/2014 Clinical Stage   Stage IIA: T2 N0   11/13/2014 Definitive Surgery   Right Lumpectomy/SLNB Stacey Navarro): IDC, grade 3, 2.7 cm size, 3 LN removed and negative for malignancy (0/3) margins clear. HER2/neu repeated and remains negative (ratio 0.88).   11/13/2014 Pathologic Stage   Stage IIA: pT2 pN0   11/13/2014 Oncotype testing   Score 19 (12% ROR). No chemotherapy Pamelia Hoit).   12/26/2014 - 01/29/2015 Radiation Therapy   Adjuvant RT [place] Scrape): Right breast 48 Gy over 24 sessions   02/14/2015 -  Anti-estrogen oral therapy   Anastrozole 1 mg daily.     Survivorship   Survivorship care  plan completed and mailed to patient in lieu of inperson visit.     CHIEF COMPLIANT:   Discussed the use of AI scribe software for clinical note transcription with the patient, who gave verbal consent to proceed.  History of Present Illness   Stacey Navarro, a patient with a history of breast cancer, has been on anastrozole for seven years. She expresses anxiety about discontinuing the medication, viewing it as a form of protection against cancer recurrence. She reports experiencing hot flashes while on the medication, which have subsided slightly since she stopped taking it a month or two ago. Despite this, she is willing to continue the medication for her peace of mind.  Stacey Navarro also discusses her recent mammogram results, which indicate a low breast density (category B), reducing her risk of missed breast cancers. She expresses interest in understanding more about breast density and its implications for her daughters and granddaughter.  In terms of her overall health, Stacey Navarro mentions that her exercise routine has been disrupted due to the COVID-19 pandemic. She acknowledges the need to resume physical activity, particularly walking, for overall health maintenance. She also mentions a recent birthday trip to [facility], indicating a good level of social activity and support.         ALLERGIES:  is allergic to xarelto [rivaroxaban].  MEDICATIONS:  Current Outpatient Medications  Medication Sig Dispense Refill   anastrozole (ARIMIDEX) 1 MG tablet Take 1 tablet (1 mg total) by mouth daily. 90 tablet 3   apixaban (ELIQUIS) 2.5 MG TABS tablet Take 1 tablet (2.5 mg total) by mouth 2 (two) times daily. 180 tablet 1   buPROPion (WELLBUTRIN XL) 150 MG 24 hr  tablet Take 1 tablet by mouth once daily 90 tablet 0   cholecalciferol (VITAMIN D) 1000 UNITS tablet Take 1,000 Units by mouth daily.     Omega-3 Fatty Acids (FISH OIL) 1000 MG CAPS Take 1 capsule by mouth daily.     pantoprazole  (PROTONIX) 40 MG tablet TAKE 1 TABLET BY MOUTH ONCE DAILY AS NEEDED 90 tablet 0   rosuvastatin (CRESTOR) 20 MG tablet Take 1 tablet by mouth once daily 90 tablet 0   No current facility-administered medications for this visit.    PHYSICAL EXAMINATION: ECOG PERFORMANCE STATUS: 1 - Symptomatic but completely ambulatory  Vitals:   04/09/23 0856  BP: 133/67  Pulse: 82  Resp: 18  Temp: 97.8 F (36.6 C)  SpO2: 96%   Filed Weights   04/09/23 0856  Weight: 230 lb 14.4 oz (104.7 kg)    Physical Exam          (exam performed in the presence of a chaperone)  LABORATORY DATA:  I have reviewed the data as listed    Latest Ref Rng & Units 09/02/2022    2:44 PM 11/03/2021   11:04 AM 11/20/2020    3:29 PM  CMP  Glucose 70 - 99 mg/dL 161  096  045   BUN 6 - 23 mg/dL 18  21  21    Creatinine 0.40 - 1.20 mg/dL 4.09  8.11  9.14   Sodium 135 - 145 mEq/L 141  140  140   Potassium 3.5 - 5.1 mEq/L 4.4  4.5  4.3   Chloride 96 - 112 mEq/L 106  105  103   CO2 19 - 32 mEq/L 26  28  29    Calcium 8.4 - 10.5 mg/dL 78.2  9.9  95.6   Total Protein 6.0 - 8.3 g/dL 7.8  7.2  7.5   Total Bilirubin 0.2 - 1.2 mg/dL 0.5  0.4  0.7   Alkaline Phos 39 - 117 U/L 101  105  98   AST 0 - 37 U/L 30  31  43   ALT 0 - 35 U/L 33  34  54     Lab Results  Component Value Date   WBC 8.0 09/02/2022   HGB 14.3 09/02/2022   HCT 42.3 09/02/2022   MCV 95.3 09/02/2022   PLT 285.0 09/02/2022   NEUTROABS 4.6 09/02/2022    ASSESSMENT & PLAN:  Breast cancer of upper-inner quadrant of right female breast Rt Lumpectomy 11/14/14: IDC Grade 3, 2.7 cm size, 0/3 LN , Margins clear; T2N0 Stage IIA Right breast invasive ductal carcinoma diagnosed with screening mammogram, 3.1 cm by ultrasound in the upper quadrant 6 cm from the nipple, grade 2-3, EF 100%, PR 48%, HER-2 negative ratio 0.6, Ki-67 was 28%, Oncotype DX score 19, 12% risk of recurrence, status post radiation 4800 cGy in 24 sessions completed 01/29/2015   Treatment  plan: antiestrogen therapy with anastrozole 1 mg daily started 02/14/2015, plan for 10 years. She wants to take it longer but I am concerned about her bone density and her inability to take bisphosphonates We will order a bone density test.   Anastrozole toxicities: Tolerating it very well without any side effects   Breast Cancer Surveillance: 1. Breast exam 04/09/2023: Benign 2. Mammograms 04/06/2023: Benign, breast density category B 3. Bone density:  03/10/2019: T score -2.5: Osteoporosis   Osteoporosis: Recommended calcium vitamin D and bisphosphonate therapy.  She did not want to take bisphosphonates.     Return to clinic in 1  year for follow-up ------------------------------------- Assessment and Plan    Breast Cancer Completed 7 years of Anastrozole therapy with good tolerance. Discussed the benefits and risks of extending therapy to 10 years. Patient expressed desire to continue therapy for peace of mind. -Continue Anastrozole therapy for an additional 3 years. -Refill Anastrozole prescription for 1 year with refills.  Bone Health Last bone density test was in 2020. Discussed the need for a repeat test to monitor bone health due to the potential bone density loss from Anastrozole therapy. -Schedule bone density test at the breast center later in the year.  Breast Health Recent mammogram showed low breast density (Category B), reducing the risk of missed breast cancer on mammograms. -Continue annual mammograms.  General Health Discussed the importance of regular exercise for overall health and well-being. -Encourage resumption of regular exercise, such as walking for 30 minutes daily.          Orders Placed This Encounter  Procedures   DG Bone Density    Standing Status:   Future    Standing Expiration Date:   04/08/2024    Order Specific Question:   Reason for Exam (SYMPTOM  OR DIAGNOSIS REQUIRED)    Answer:   post menopausal osteoporosis    Order Specific Question:    Preferred imaging location?    Answer:   [facility]    Order Specific Question:   Release to patient    Answer:   Immediate   The patient has a good understanding of the overall plan. she agrees with it. she will call with any problems that may develop before the next visit here. Total time spent: 30 mins including face to face time and time spent for planning, charting and co-ordination of care   Tamsen Meek, MD 04/09/23

## 2023-04-09 ENCOUNTER — Inpatient Hospital Stay: Payer: Medicare Other | Attending: Hematology and Oncology | Admitting: Hematology and Oncology

## 2023-04-09 VITALS — BP 133/67 | HR 82 | Temp 97.8°F | Resp 18 | Ht 66.0 in | Wt 230.9 lb

## 2023-04-09 DIAGNOSIS — Z79811 Long term (current) use of aromatase inhibitors: Secondary | ICD-10-CM | POA: Insufficient documentation

## 2023-04-09 DIAGNOSIS — Z170 Estrogen receptor positive status [ER+]: Secondary | ICD-10-CM | POA: Diagnosis not present

## 2023-04-09 DIAGNOSIS — C50211 Malignant neoplasm of upper-inner quadrant of right female breast: Secondary | ICD-10-CM | POA: Insufficient documentation

## 2023-04-09 MED ORDER — ANASTROZOLE 1 MG PO TABS: 1.0000 mg | ORAL_TABLET | Freq: Every day | ORAL | 3 refills | Status: DC

## 2023-04-09 NOTE — Assessment & Plan Note (Signed)
Rt Lumpectomy 11/14/14: IDC Grade 3, 2.7 cm size, 0/3 LN , Margins clear; T2N0 Stage IIA Right breast invasive ductal carcinoma diagnosed with screening mammogram, 3.1 cm by ultrasound in the upper quadrant 6 cm from the nipple, grade 2-3, EF 100%, PR 48%, HER-2 negative ratio 0.6, Ki-67 was 28%, Oncotype DX score 19, 12% risk of recurrence, status post radiation 4800 cGy in 24 sessions completed 01/29/2015   Treatment plan: antiestrogen therapy with anastrozole 1 mg daily started 02/14/2015, plan for 7 years. She wants to take it longer but I am concerned about her bone density and her inability to take bisphosphonates   Anastrozole toxicities: Tolerating it very well without any side effects   Breast Cancer Surveillance: 1. Breast exam 04/09/2023: Benign 2. Mammograms 04/06/2023: Benign, breast density category B 3. Bone density:  03/10/2019: T score -2.5: Osteoporosis   Osteoporosis: Recommended calcium vitamin D and bisphosphonate therapy.  I sent a prescription for Fosamax. But she hasnot taken it because of dental issues     Return to clinic in 1 year for follow-up

## 2023-05-01 ENCOUNTER — Other Ambulatory Visit: Payer: Self-pay | Admitting: Internal Medicine

## 2023-05-01 DIAGNOSIS — D6859 Other primary thrombophilia: Secondary | ICD-10-CM

## 2023-05-06 ENCOUNTER — Encounter: Payer: Self-pay | Admitting: Internal Medicine

## 2023-05-09 ENCOUNTER — Other Ambulatory Visit: Payer: Self-pay | Admitting: Internal Medicine

## 2023-06-07 ENCOUNTER — Other Ambulatory Visit: Payer: Self-pay | Admitting: Internal Medicine

## 2023-06-07 DIAGNOSIS — F322 Major depressive disorder, single episode, severe without psychotic features: Secondary | ICD-10-CM

## 2023-06-09 ENCOUNTER — Other Ambulatory Visit: Payer: Self-pay | Admitting: Internal Medicine

## 2023-06-09 DIAGNOSIS — F322 Major depressive disorder, single episode, severe without psychotic features: Secondary | ICD-10-CM

## 2023-06-10 ENCOUNTER — Encounter: Payer: Self-pay | Admitting: Internal Medicine

## 2023-06-11 ENCOUNTER — Other Ambulatory Visit: Payer: Self-pay

## 2023-06-11 DIAGNOSIS — F322 Major depressive disorder, single episode, severe without psychotic features: Secondary | ICD-10-CM

## 2023-06-11 DIAGNOSIS — M5432 Sciatica, left side: Secondary | ICD-10-CM | POA: Diagnosis not present

## 2023-06-11 DIAGNOSIS — M25562 Pain in left knee: Secondary | ICD-10-CM | POA: Diagnosis not present

## 2023-06-11 MED ORDER — BUPROPION HCL ER (XL) 150 MG PO TB24: 150.0000 mg | ORAL_TABLET | Freq: Every day | ORAL | 0 refills | Status: DC

## 2023-06-26 ENCOUNTER — Other Ambulatory Visit: Payer: Self-pay | Admitting: Internal Medicine

## 2023-06-26 DIAGNOSIS — E785 Hyperlipidemia, unspecified: Secondary | ICD-10-CM

## 2023-07-08 ENCOUNTER — Other Ambulatory Visit: Payer: Self-pay | Admitting: Internal Medicine

## 2023-07-08 DIAGNOSIS — E785 Hyperlipidemia, unspecified: Secondary | ICD-10-CM

## 2023-07-11 ENCOUNTER — Other Ambulatory Visit: Payer: Self-pay | Admitting: Internal Medicine

## 2023-07-11 DIAGNOSIS — E785 Hyperlipidemia, unspecified: Secondary | ICD-10-CM

## 2023-07-12 ENCOUNTER — Other Ambulatory Visit: Payer: Self-pay | Admitting: Internal Medicine

## 2023-07-12 DIAGNOSIS — E785 Hyperlipidemia, unspecified: Secondary | ICD-10-CM

## 2023-07-14 ENCOUNTER — Encounter: Payer: Self-pay | Admitting: Internal Medicine

## 2023-07-14 ENCOUNTER — Ambulatory Visit: Payer: Medicare Other | Admitting: Internal Medicine

## 2023-07-14 ENCOUNTER — Other Ambulatory Visit: Payer: Self-pay | Admitting: Internal Medicine

## 2023-07-14 DIAGNOSIS — E785 Hyperlipidemia, unspecified: Secondary | ICD-10-CM

## 2023-07-19 ENCOUNTER — Other Ambulatory Visit: Payer: Self-pay | Admitting: Internal Medicine

## 2023-07-19 DIAGNOSIS — F322 Major depressive disorder, single episode, severe without psychotic features: Secondary | ICD-10-CM

## 2023-08-05 ENCOUNTER — Ambulatory Visit: Payer: Medicare Other | Admitting: Internal Medicine

## 2023-08-05 ENCOUNTER — Encounter: Payer: Self-pay | Admitting: Internal Medicine

## 2023-08-05 VITALS — BP 126/68 | HR 81 | Temp 97.8°F | Resp 16 | Ht 66.0 in | Wt 222.8 lb

## 2023-08-05 DIAGNOSIS — Z0000 Encounter for general adult medical examination without abnormal findings: Secondary | ICD-10-CM

## 2023-08-05 DIAGNOSIS — E785 Hyperlipidemia, unspecified: Secondary | ICD-10-CM | POA: Diagnosis not present

## 2023-08-05 DIAGNOSIS — Z0001 Encounter for general adult medical examination with abnormal findings: Secondary | ICD-10-CM

## 2023-08-05 DIAGNOSIS — R7303 Prediabetes: Secondary | ICD-10-CM | POA: Diagnosis not present

## 2023-08-05 DIAGNOSIS — K219 Gastro-esophageal reflux disease without esophagitis: Secondary | ICD-10-CM | POA: Diagnosis not present

## 2023-08-05 DIAGNOSIS — Z136 Encounter for screening for cardiovascular disorders: Secondary | ICD-10-CM | POA: Diagnosis not present

## 2023-08-05 DIAGNOSIS — D6859 Other primary thrombophilia: Secondary | ICD-10-CM

## 2023-08-05 DIAGNOSIS — K7581 Nonalcoholic steatohepatitis (NASH): Secondary | ICD-10-CM | POA: Diagnosis not present

## 2023-08-05 DIAGNOSIS — F322 Major depressive disorder, single episode, severe without psychotic features: Secondary | ICD-10-CM

## 2023-08-05 DIAGNOSIS — B3731 Acute candidiasis of vulva and vagina: Secondary | ICD-10-CM | POA: Insufficient documentation

## 2023-08-05 LAB — BASIC METABOLIC PANEL
BUN: 27 mg/dL — ABNORMAL HIGH (ref 6–23)
CO2: 28 meq/L (ref 19–32)
Calcium: 10.2 mg/dL (ref 8.4–10.5)
Chloride: 104 meq/L (ref 96–112)
Creatinine, Ser: 1.07 mg/dL (ref 0.40–1.20)
GFR: 48.95 mL/min — ABNORMAL LOW (ref >60.00)
Glucose, Bld: 111 mg/dL — ABNORMAL HIGH (ref 70–99)
Potassium: 4.3 meq/L (ref 3.5–5.1)
Sodium: 140 meq/L (ref 135–145)

## 2023-08-05 LAB — HEPATIC FUNCTION PANEL
ALT: 31 U/L (ref 0–35)
AST: 32 U/L (ref 0–37)
Albumin: 4.4 g/dL (ref 3.5–5.2)
Alkaline Phosphatase: 103 U/L (ref 39–117)
Bilirubin, Direct: 0 mg/dL (ref 0.0–0.3)
Total Bilirubin: 0.4 mg/dL (ref 0.2–1.2)
Total Protein: 7.3 g/dL (ref 6.0–8.3)

## 2023-08-05 LAB — CBC WITH DIFFERENTIAL/PLATELET
Basophils Absolute: 0 10*3/uL (ref 0.0–0.1)
Basophils Relative: 0.7 % (ref 0.0–3.0)
Eosinophils Absolute: 0.2 10*3/uL (ref 0.0–0.7)
Eosinophils Relative: 3.7 % (ref 0.0–5.0)
HCT: 43.7 % (ref 36.0–46.0)
Hemoglobin: 14.2 g/dL (ref 12.0–15.0)
Lymphocytes Relative: 27.2 % (ref 12.0–46.0)
Lymphs Abs: 1.8 10*3/uL (ref 0.7–4.0)
MCHC: 32.4 g/dL (ref 30.0–36.0)
MCV: 96.4 fL (ref 78.0–100.0)
Monocytes Absolute: 0.6 10*3/uL (ref 0.1–1.0)
Monocytes Relative: 8.5 % (ref 3.0–12.0)
Neutro Abs: 3.9 10*3/uL (ref 1.4–7.7)
Neutrophils Relative %: 59.9 % (ref 43.0–77.0)
Platelets: 288 10*3/uL (ref 150.0–400.0)
RBC: 4.53 Mil/uL (ref 3.87–5.11)
RDW: 14 % (ref 11.5–15.5)
WBC: 6.6 10*3/uL (ref 4.0–10.5)

## 2023-08-05 LAB — LIPID PANEL
Cholesterol: 168 mg/dL (ref 0–200)
HDL: 53.1 mg/dL (ref >39.00)
LDL Cholesterol: 89 mg/dL (ref 0–99)
NonHDL: 115.29
Total CHOL/HDL Ratio: 3
Triglycerides: 129 mg/dL (ref 0.0–149.0)
VLDL: 25.8 mg/dL (ref 0.0–40.0)

## 2023-08-05 LAB — PROTIME-INR
INR: 1.2 {ratio} — ABNORMAL HIGH (ref 0.8–1.0)
Prothrombin Time: 12.2 s (ref 9.6–13.1)

## 2023-08-05 LAB — TSH: TSH: 3.21 u[IU]/mL (ref 0.35–5.50)

## 2023-08-05 LAB — HEMOGLOBIN A1C: Hgb A1c MFr Bld: 6.3 % (ref 4.6–6.5)

## 2023-08-05 LAB — CK: Total CK: 119 U/L (ref 7–177)

## 2023-08-05 MED ORDER — FLUCONAZOLE 150 MG PO TABS: 150.0000 mg | ORAL_TABLET | Freq: Once | ORAL | 5 refills | Status: AC

## 2023-08-05 NOTE — Progress Notes (Signed)
 Subjective:  Patient ID: Stacey Navarro, female    DOB: 1940/11/22  Age: 83 y.o. MRN: [id number]  CC: Annual Exam, Osteoarthritis, and Hyperlipidemia   HPI Stacey Navarro presents for a CPX and f/up -----  Discussed the use of AI scribe software for clinical note transcription with the patient, who gave verbal consent to proceed.  History of Present Illness   The patient presents for a routine follow-up. She reports feeling well overall, with no complaints of chest pain, shortness of breath, dizziness, or lightheadedness. She denies any bleeding, bruising, or signs of blood clots. The patient also denies any adverse effects from her current medications, including muscle or joint aches from statin therapy.  The patient maintains an active lifestyle, engaging in walking and using a recumbent bike regularly. She denies any exertional chest pain or shortness of breath.   The patient also reports joint discomfort due to arthritis. She denies any heartburn symptoms or difficulty swallowing. She has not received a flu vaccine.       Outpatient Medications Prior to Visit  Medication Sig Dispense Refill   anastrozole  (ARIMIDEX ) 1 MG tablet Take 1 tablet (1 mg total) by mouth daily. 90 tablet 3   cholecalciferol (VITAMIN D) 1000 UNITS tablet Take 1,000 Units by mouth daily.     buPROPion  (WELLBUTRIN  XL) 150 MG 24 hr tablet Take 1 tablet by mouth once daily 30 tablet 0   ELIQUIS  2.5 MG TABS tablet Take 1 tablet by mouth twice daily 180 tablet 0   pantoprazole  (PROTONIX ) 40 MG tablet TAKE 1 TABLET BY MOUTH ONCE DAILY AS NEEDED 90 tablet 0   rosuvastatin  (CRESTOR ) 20 MG tablet Take 1 tablet by mouth once daily 30 tablet 0   Omega-3 Fatty Acids (FISH OIL) 1000 MG CAPS Take 1 capsule by mouth daily.     No facility-administered medications prior to visit.    ROS Review of Systems  Constitutional:  Negative for appetite change, diaphoresis, fatigue and unexpected weight change.  HENT:  Negative.    Eyes: Negative.   Respiratory:  Negative for cough, chest tightness, shortness of breath and wheezing.   Cardiovascular:  Negative for chest pain, palpitations and leg swelling.  Gastrointestinal:  Negative for abdominal pain, constipation, diarrhea, nausea and vomiting.  Endocrine: Negative.   Genitourinary:  Positive for vaginal discharge. Negative for difficulty urinating, frequency, hematuria and vaginal bleeding.  Musculoskeletal:  Positive for arthralgias. Negative for myalgias and neck pain.  Skin: Negative.   Neurological: Negative.  Negative for dizziness, weakness and light-headedness.  Hematological:  Negative for adenopathy. Does not bruise/bleed easily.  Psychiatric/Behavioral: Negative.      Objective:  BP 126/68 (BP Location: Left Arm, Patient Position: Sitting, Cuff Size: Large)   Pulse 81   Temp 97.8 F (36.6 C) (Oral)   Resp 16   Ht 5' 6 (1.676 m)   Wt 222 lb 12.8 oz (101.1 kg)   SpO2 93%   BMI 35.96 kg/m   BP Readings from Last 3 Encounters:  08/05/23 126/68  04/09/23 133/67  09/02/22 122/68    Wt Readings from Last 3 Encounters:  08/05/23 222 lb 12.8 oz (101.1 kg)  04/09/23 230 lb 14.4 oz (104.7 kg)  12/07/22 230 lb (104.3 kg)    Physical Exam Vitals reviewed.  Constitutional:      Appearance: Normal appearance.  HENT:     Nose: Nose normal.     Mouth/Throat:     Mouth: Mucous membranes are moist.  Eyes:  General: No scleral icterus.    Conjunctiva/sclera: Conjunctivae normal.  Cardiovascular:     Rate and Rhythm: Normal rate and regular rhythm.     Heart sounds: No murmur heard.    Comments: EKG-- NSR, 70 bpm Incomplete RBBB - new No Q  waves Pulmonary:     Effort: Pulmonary effort is normal.     Breath sounds: No stridor. No wheezing, rhonchi or rales.  Abdominal:     General: Abdomen is flat.     Palpations: There is no mass.     Tenderness: There is no abdominal tenderness. There is no guarding.     Hernia: No  hernia is present.  Musculoskeletal:     Cervical back: Neck supple.     Right lower leg: No edema.     Left lower leg: No edema.  Lymphadenopathy:     Cervical: No cervical adenopathy.  Neurological:     General: No focal deficit present.     Mental Status: She is alert. Mental status is at baseline.  Psychiatric:        Mood and Affect: Mood normal.        Behavior: Behavior normal.     Lab Results  Component Value Date   WBC 6.6 08/05/2023   HGB 14.2 08/05/2023   HCT 43.7 08/05/2023   PLT 288.0 08/05/2023   GLUCOSE 111 (H) 08/05/2023   CHOL 168 08/05/2023   TRIG 129.0 08/05/2023   HDL 53.10 08/05/2023   LDLDIRECT 158.4 07/17/2011   LDLCALC 89 08/05/2023   ALT 31 08/05/2023   AST 32 08/05/2023   NA 140 08/05/2023   K 4.3 08/05/2023   CL 104 08/05/2023   CREATININE 1.07 08/05/2023   BUN 27 (H) 08/05/2023   CO2 28 08/05/2023   TSH 3.21 08/05/2023   INR 1.2 (H) 08/05/2023   HGBA1C 6.3 08/05/2023    MM 3D SCREENING MAMMOGRAM BILATERAL BREAST Result Date: 04/06/2023 CLINICAL DATA:  Screening. EXAM: DIGITAL SCREENING BILATERAL MAMMOGRAM WITH TOMOSYNTHESIS AND CAD TECHNIQUE: Bilateral screening digital craniocaudal and mediolateral oblique mammograms were obtained. Bilateral screening digital breast tomosynthesis was performed. The images were evaluated with computer-aided detection. COMPARISON:  Previous exam(s). ACR Breast Density Category b: There are scattered areas of fibroglandular density. FINDINGS: There are no findings suspicious for malignancy. IMPRESSION: No mammographic evidence of malignancy. A result letter of this screening mammogram will be mailed directly to the patient. RECOMMENDATION: Screening mammogram in one year. (Code:SM-B-01Y) BI-RADS CATEGORY  1: Negative. Electronically Signed   By: Leita Mattocks M.D.   On: 04/06/2023 16:44    Assessment & Plan:  Gastroesophageal reflux disease without esophagitis -     CBC with Differential/Platelet; Future -      Pantoprazole  Sodium; Take 1 tablet (40 mg total) by mouth daily as needed.  Dispense: 90 tablet; Refill: 1  Prediabetes -     Basic metabolic panel; Future -     Hemoglobin A1c; Future  Hyperlipidemia with target LDL less than 130- LDL goal achieved. Doing well on the statin  -     Lipid panel; Future -     TSH; Future -     CK; Future -     Rosuvastatin  Calcium ; Take 1 tablet (20 mg total) by mouth daily.  Dispense: 90 tablet; Refill: 1  NASH (nonalcoholic steatohepatitis) -     Lipid panel; Future -     Basic metabolic panel; Future -     Hepatic function panel; Future -  Protime-INR; Future  Hypercoagulable state, primary (HCC)- Will continue the DOAC. -     Protime-INR; Future -     Apixaban ; Take 1 tablet (2.5 mg total) by mouth 2 (two) times daily.  Dispense: 180 tablet; Refill: 1  Screening, heart disease, ischemic- EKG is normal. -     EKG 12-Lead  Encounter for general adult medical examination with abnormal findings - Exam completed, labs reviewed, vaccines reviewed, cancer screenings addressed, pt ed material was given.   Yeast vaginitis -     Fluconazole ; Take 1 tablet (150 mg total) by mouth once for 1 dose.  Dispense: 1 tablet; Refill: 5  Current severe episode of major depressive disorder without psychotic features without prior episode (HCC) -     buPROPion  HCl ER (XL); Take 1 tablet (150 mg total) by mouth daily.  Dispense: 90 tablet; Refill: 1     Follow-up: Return in about 6 months (around 02/02/2024).  Debby Molt, MD

## 2023-08-05 NOTE — Patient Instructions (Signed)

## 2023-08-06 MED ORDER — PANTOPRAZOLE SODIUM 40 MG PO TBEC: 40.0000 mg | DELAYED_RELEASE_TABLET | Freq: Every day | ORAL | 1 refills | Status: AC | PRN

## 2023-08-06 MED ORDER — ROSUVASTATIN CALCIUM 20 MG PO TABS: 20.0000 mg | ORAL_TABLET | Freq: Every day | ORAL | 1 refills | Status: DC

## 2023-08-06 MED ORDER — BUPROPION HCL ER (XL) 150 MG PO TB24: 150.0000 mg | ORAL_TABLET | Freq: Every day | ORAL | 1 refills | Status: DC

## 2023-08-06 MED ORDER — APIXABAN 2.5 MG PO TABS: 2.5000 mg | ORAL_TABLET | Freq: Two times a day (BID) | ORAL | 1 refills | Status: DC

## 2023-08-26 DIAGNOSIS — R202 Paresthesia of skin: Secondary | ICD-10-CM | POA: Diagnosis not present

## 2023-08-26 DIAGNOSIS — R200 Anesthesia of skin: Secondary | ICD-10-CM | POA: Diagnosis not present

## 2023-09-09 DIAGNOSIS — G5603 Carpal tunnel syndrome, bilateral upper limbs: Secondary | ICD-10-CM | POA: Insufficient documentation

## 2023-09-16 ENCOUNTER — Telehealth: Payer: Self-pay

## 2023-09-16 NOTE — Telephone Encounter (Signed)
 Copied from CRM [id number]. Topic: Clinical - Medication Question >> Sep 16, 2023  3:59 PM Deaijah H wrote:  Reason for CRM: Kim Dr. Sydell Axon and Plastic surgeon called in to follow up on clearance request regarding medication eliquis faxed on feb 17th. / please call [phone] to confirm if it's been received

## 2023-09-20 NOTE — Telephone Encounter (Signed)
 This has been filled out signed and faxed successfully.

## 2023-09-22 ENCOUNTER — Telehealth: Payer: Self-pay | Admitting: Internal Medicine

## 2023-09-22 NOTE — Telephone Encounter (Signed)
 Copied from CRM [id number]. Topic: Clinical - Medical Advice >> Sep 22, 2023  3:07 PM [place] J wrote: Reason for CRM: Laney Pastor (nurse) calling from [facility]. Vernona Rieger is calling in needing instruction on how she should go about anticoagulation clearance for the patient. She would like if a call back could be made to the front desk at [phone] and to leave a message for her to come back to.

## 2023-09-24 NOTE — Telephone Encounter (Signed)
 The clearance can be sent to you ?

## 2023-09-28 NOTE — Telephone Encounter (Signed)
 Dr Thad Ranger office already handled this per Vernona Rieger.

## 2023-10-04 DIAGNOSIS — G5601 Carpal tunnel syndrome, right upper limb: Secondary | ICD-10-CM | POA: Diagnosis not present

## 2023-10-14 DIAGNOSIS — G5603 Carpal tunnel syndrome, bilateral upper limbs: Secondary | ICD-10-CM | POA: Diagnosis not present

## 2023-10-25 ENCOUNTER — Telehealth: Payer: Self-pay | Admitting: Internal Medicine

## 2023-10-25 NOTE — Telephone Encounter (Signed)
 Copied from CRM [id number]. Topic: General - Other >> Oct 22, 2023  4:44 PM Denese Killings wrote: Reason for CRM: Larita Fife with [facility] is calling to see if fax was received for surgery clearance to be off of apixaban (ELIQUIS) 2.5 MG TABS tablet. Patient is scheduled for surgery on 04/02 and needs this signed and sent back as soon as possible. The place that is conducting the surgery is requiring clearance for the left side.

## 2023-10-27 DIAGNOSIS — G5602 Carpal tunnel syndrome, left upper limb: Secondary | ICD-10-CM | POA: Diagnosis not present

## 2023-10-28 NOTE — Telephone Encounter (Signed)
 Patient has had the surgery already.

## 2023-11-18 DIAGNOSIS — G5603 Carpal tunnel syndrome, bilateral upper limbs: Secondary | ICD-10-CM | POA: Diagnosis not present

## 2023-11-23 ENCOUNTER — Encounter: Payer: Self-pay | Admitting: Internal Medicine

## 2023-11-23 ENCOUNTER — Ambulatory Visit (INDEPENDENT_AMBULATORY_CARE_PROVIDER_SITE_OTHER): Admitting: Internal Medicine

## 2023-11-23 VITALS — BP 120/78 | HR 80 | Temp 98.4°F | Ht 66.0 in | Wt 224.0 lb

## 2023-11-23 DIAGNOSIS — F322 Major depressive disorder, single episode, severe without psychotic features: Secondary | ICD-10-CM

## 2023-11-23 DIAGNOSIS — L989 Disorder of the skin and subcutaneous tissue, unspecified: Secondary | ICD-10-CM | POA: Insufficient documentation

## 2023-11-23 DIAGNOSIS — R7303 Prediabetes: Secondary | ICD-10-CM

## 2023-11-23 MED ORDER — TRIAMCINOLONE ACETONIDE 0.1 % EX CREA: 1.0000 | TOPICAL_CREAM | Freq: Two times a day (BID) | CUTANEOUS | 0 refills | Status: DC

## 2023-11-23 NOTE — Assessment & Plan Note (Signed)
 Mild worsening, but declines change in tx or referral

## 2023-11-23 NOTE — Assessment & Plan Note (Signed)
 Exam c/w possible skin malignancy, for topical triam 1% cr topical prn itching, and refer Dermatology

## 2023-11-23 NOTE — Assessment & Plan Note (Signed)
 Lab Results  Component Value Date   HGBA1C 6.3 08/05/2023   Stable, pt to continue current medical treatment  - diet, wt control

## 2023-11-23 NOTE — Progress Notes (Signed)
 Patient ID: Stacey Navarro, female   DOB: 07-04-1941, 83 y.o.   MRN: [id number]        Chief Complaint: follow up left mid back skin lesion irritated, preDM, depression       HPI:  Stacey Navarro is a 83 y.o. female here with c/o 1 wk onset persistent slight raised skin lesion to left flank mid back with soreness persistent to touch and itchy, hard to stop scratching.  Pt denies chest pain, increased sob or doe, wheezing, orthopnea, PND, increased LE swelling, palpitations, dizziness or syncope.   Pt denies polydipsia, polyuria, or new focal neuro s/s.   Has had mild worsening depressive symptoms recently, but no suicidal ideation, or panic.         Wt Readings from Last 3 Encounters:  11/23/23 224 lb (101.6 kg)  08/05/23 222 lb 12.8 oz (101.1 kg)  04/09/23 230 lb 14.4 oz (104.7 kg)   BP Readings from Last 3 Encounters:  11/23/23 120/78  08/05/23 126/68  04/09/23 133/67         Past Medical History:  Diagnosis Date   Arthritis 2010   OA in knees   Breast cancer (HCC) 2016   right   Breast cancer of upper-inner quadrant of right female breast (HCC) 10/23/2014   Carpal tunnel syndrome 02/12/2015   Right   GERD (gastroesophageal reflux disease)    Hyperlipidemia    Personal history of radiation therapy    Primary localized osteoarthritis of left knee 07/12/2015   Primary localized osteoarthritis of right knee 05/03/2015   S/P radiation therapy 12/26/2014 through 01/29/2015                                                      Right breast 4800 cGy in 24 sessions                          Past Surgical History:  Procedure Laterality Date   BREAST LUMPECTOMY Right 11/13/2014   CHOLECYSTECTOMY  2011   DILATION AND CURETTAGE OF UTERUS     PARTIAL KNEE ARTHROPLASTY Right 05/03/2015   Procedure: RIGHT UNICOMPARTMENTAL KNEE;  Surgeon: Osa Blase, MD;  Location: [facility];  Service: Orthopedics;  Laterality: Right;   PARTIAL KNEE ARTHROPLASTY Left 07/12/2015    Procedure: LEFT UNICOMPARTMENTAL KNEE;  Surgeon: Osa Blase, MD;  Location: [facility];  Service: Orthopedics;  Laterality: Left;   RADIOACTIVE SEED GUIDED PARTIAL MASTECTOMY WITH AXILLARY SENTINEL LYMPH NODE BIOPSY Right 11/13/2014   Procedure: RIGHT PARTIAL MASTECTOMY WITH RADIOACTIVE SEED LOCALIZATION RIGHT  AXILLARY SENTINEL LYMPH NODE BIOPSY;  Surgeon: Boyce Byes, MD;  Location: [facility];  Service: General;  Laterality: Right;   TONSILLECTOMY  1948   TUBAL LIGATION      reports that she quit smoking about 28 years ago. Her smoking use included cigarettes. She has never used smokeless tobacco. She reports current alcohol use of about 21.0 standard drinks of alcohol per week. She reports that she does not use drugs. family history includes Alcohol abuse in her father; Breast cancer in her maternal aunt; Hypertension in her mother. Allergies  Allergen Reactions   Xarelto  [Rivaroxaban ]     Rash   Current Outpatient Medications on File Prior to Visit  Medication Sig Dispense Refill  anastrozole  (ARIMIDEX ) 1 MG tablet Take 1 tablet (1 mg total) by mouth daily. 90 tablet 3   apixaban  (ELIQUIS ) 2.5 MG TABS tablet Take 1 tablet (2.5 mg total) by mouth 2 (two) times daily. 180 tablet 1   buPROPion  (WELLBUTRIN  XL) 150 MG 24 hr tablet Take 1 tablet (150 mg total) by mouth daily. 90 tablet 1   cholecalciferol (VITAMIN D) 1000 UNITS tablet Take 1,000 Units by mouth daily.     fluconazole  (DIFLUCAN ) 150 MG tablet Take 150 mg by mouth once.     pantoprazole  (PROTONIX ) 40 MG tablet Take 1 tablet (40 mg total) by mouth daily as needed. 90 tablet 1   rosuvastatin  (CRESTOR ) 20 MG tablet Take 1 tablet (20 mg total) by mouth daily. 90 tablet 1   traMADol  (ULTRAM ) 50 MG tablet Take 50 mg by mouth every 6 (six) hours as needed.     No current facility-administered medications on file prior to visit.        ROS:  All others reviewed and negative.  Objective        PE:   BP 120/78 (BP Location: Right Arm, Patient Position: Sitting, Cuff Size: Normal)   Pulse 80   Temp 98.4 F (36.9 C) (Oral)   Ht 5\' 6"  (1.676 m)   Wt 224 lb (101.6 kg)   SpO2 96%   BMI 36.15 kg/m                 Constitutional: Pt appears in NAD               HENT: Head: NCAT.                Right Ear: External ear normal.                 Left Ear: External ear normal.                Eyes: . Pupils are equal, round, and reactive to light. Conjunctivae and EOM are normal               Nose: without d/c or deformity               Neck: Neck supple. Gross normal ROM               Cardiovascular: Normal rate and regular rhythm.                 Pulmonary/Chest: Effort normal and breath sounds without rales or wheezing.                Left mid back with 1.5 cm sliightly raised firm scaly lesion with surrounding irritative erythema                Neurological: Pt is alert. At baseline orientation, motor grossly intact               Skin: Skin is warm. LE edema - none               Psychiatric: Pt behavior is normal without agitation , depressed affect  Micro: none  Cardiac tracings I have personally interpreted today:  none  Pertinent Radiological findings (summarize): none   Lab Results  Component Value Date   WBC 6.6 08/05/2023   HGB 14.2 08/05/2023   HCT 43.7 08/05/2023   PLT 288.0 08/05/2023   GLUCOSE 111 (H) 08/05/2023   CHOL 168 08/05/2023   TRIG 129.0 08/05/2023   HDL 53.10 08/05/2023  LDLDIRECT 158.4 07/17/2011   LDLCALC 89 08/05/2023   ALT 31 08/05/2023   AST 32 08/05/2023   NA 140 08/05/2023   K 4.3 08/05/2023   CL 104 08/05/2023   CREATININE 1.07 08/05/2023   BUN 27 (H) 08/05/2023   CO2 28 08/05/2023   TSH 3.21 08/05/2023   INR 1.2 (H) 08/05/2023   HGBA1C 6.3 08/05/2023   Assessment/Plan:  Stacey Navarro is a 83 y.o. White or Caucasian [1] female with  has a past medical history of Arthritis (2010), Breast cancer (HCC) (2016), Breast cancer of upper-inner  quadrant of right female breast (HCC) (10/23/2014), Carpal tunnel syndrome (02/12/2015), GERD (gastroesophageal reflux disease), Hyperlipidemia, Personal history of radiation therapy, Primary localized osteoarthritis of left knee (07/12/2015), Primary localized osteoarthritis of right knee (05/03/2015), and S/P radiation therapy (12/26/2014 through 01/29/2015                                                   ).  Prediabetes Lab Results  Component Value Date   HGBA1C 6.3 08/05/2023   Stable, pt to continue current medical treatment  - diet, wt control   Back skin lesion Exam c/w possible skin malignancy, for topical triam 1% cr topical prn itching, and refer Dermatology  Current severe episode of major depressive disorder without psychotic features without prior episode (HCC) Mild worsening, but declines change in tx or referral  Followup: Return if symptoms worsen or fail to improve.  Rosalia Colonel, MD 11/23/2023 7:59 PM [facility]

## 2023-11-23 NOTE — Patient Instructions (Signed)
 You appear to have possible skin cancer (not melanoma) to the left mid back and irritated  Please take all new medication as prescribed - the mild cream for itching  Please continue all other medications as before, and refills have been done if requested.  Please have the pharmacy call with any other refills you may need.  Please keep your appointments with your specialists as you may have planned  You will be contacted regarding the referral for: Dermatology

## 2023-11-24 ENCOUNTER — Ambulatory Visit
Admission: RE | Admit: 2023-11-24 | Discharge: 2023-11-24 | Disposition: A | Discharge status: Home / Self Care | Payer: Medicare Other | Source: Ambulatory Visit | Attending: Hematology and Oncology | Admitting: Hematology and Oncology

## 2023-11-24 DIAGNOSIS — N958 Other specified menopausal and perimenopausal disorders: Secondary | ICD-10-CM | POA: Diagnosis not present

## 2023-11-24 DIAGNOSIS — C50211 Malignant neoplasm of upper-inner quadrant of right female breast: Secondary | ICD-10-CM

## 2023-11-24 DIAGNOSIS — H35363 Drusen (degenerative) of macula, bilateral: Secondary | ICD-10-CM | POA: Diagnosis not present

## 2023-11-24 DIAGNOSIS — M8588 Other specified disorders of bone density and structure, other site: Secondary | ICD-10-CM | POA: Diagnosis not present

## 2023-11-25 ENCOUNTER — Telehealth: Payer: Self-pay | Admitting: Internal Medicine

## 2023-11-25 NOTE — Telephone Encounter (Signed)
 Copied from CRM [id number]. Topic: Referral - Status >> Nov 24, 2023  4:34 PM Chuck Crater wrote: Reason for CRM: Patient was going to be referred to a dermatologist and wants to let Dr.Jones know that she has been [facility] on [address].

## 2023-11-26 NOTE — Telephone Encounter (Signed)
 Patient has been made aware of the referral that Dr. Autry Legions placed and she states that she will call them

## 2023-11-29 DIAGNOSIS — D485 Neoplasm of uncertain behavior of skin: Secondary | ICD-10-CM | POA: Diagnosis not present

## 2023-11-29 DIAGNOSIS — C44529 Squamous cell carcinoma of skin of other part of trunk: Secondary | ICD-10-CM | POA: Diagnosis not present

## 2023-12-08 ENCOUNTER — Ambulatory Visit (INDEPENDENT_AMBULATORY_CARE_PROVIDER_SITE_OTHER): Payer: Medicare Other

## 2023-12-08 VITALS — Ht 66.0 in | Wt 224.0 lb

## 2023-12-08 DIAGNOSIS — Z0000 Encounter for general adult medical examination without abnormal findings: Secondary | ICD-10-CM

## 2023-12-08 NOTE — Progress Notes (Signed)
 jone  Subjective:   CORENNA FORNSHELL is a 83 y.o. who presents for a Medicare Wellness preventive visit.  As a reminder, Annual Wellness Visits don't include a physical exam, and some assessments may be limited, especially if this visit is performed virtually. We may recommend an in-person visit if needed.  Visit Complete: Virtual I connected with  [name] on 12/08/23 by a audio enabled telemedicine application and verified that I am speaking with the correct person using two identifiers.  Patient Location: Home  Provider Location: Office/Clinic  I discussed the limitations of evaluation and management by telemedicine. The patient expressed understanding and agreed to proceed.  Vital Signs: Because this visit was a virtual/telehealth visit, some criteria may be missing or patient reported. Any vitals not documented were not able to be obtained and vitals that have been documented are patient reported.  VideoDeclined- This patient declined Librarian, academic. Therefore the visit was completed with audio only.  Persons Participating in Visit: Patient.  AWV Questionnaire: Yes: Patient Medicare AWV questionnaire was completed by the patient on 12/05/2023; I have confirmed that all information answered by patient is correct and no changes since this date.  Cardiac Risk Factors include: advanced age (>46men, >7 women);dyslipidemia;obesity (BMI >30kg/m2)     Objective:     Today's Vitals   12/08/23 1328  Weight: 224 lb (101.6 kg)  Height: 5\' 6"  (1.676 m)   Body mass index is 36.15 kg/m.     12/08/2023    1:26 PM 12/07/2022    4:06 PM 11/28/2021    9:27 AM 11/20/2020    2:42 PM 06/23/2016   10:26 AM 03/16/2016    4:56 PM 12/10/2015    9:57 AM  Advanced Directives  Does Patient Have a Medical Advance Directive? Yes No No No Yes Yes No  Type of Estate agent of [place];Living will     Healthcare Power of [place];Living will    Does patient want to make changes to medical advance directive?      No - Patient declined   Copy of Healthcare Power of Attorney in Chart? No - copy requested     Yes   Would patient like information on creating a medical advance directive?  No - Patient declined No - Patient declined No - Patient declined       Current Medications (verified) Outpatient Encounter Medications as of 12/08/2023  Medication Sig   anastrozole  (ARIMIDEX ) 1 MG tablet Take 1 tablet (1 mg total) by mouth daily.   apixaban  (ELIQUIS ) 2.5 MG TABS tablet Take 1 tablet (2.5 mg total) by mouth 2 (two) times daily.   buPROPion  (WELLBUTRIN  XL) 150 MG 24 hr tablet Take 1 tablet (150 mg total) by mouth daily.   cholecalciferol (VITAMIN D) 1000 UNITS tablet Take 1,000 Units by mouth daily.   pantoprazole  (PROTONIX ) 40 MG tablet Take 1 tablet (40 mg total) by mouth daily as needed.   rosuvastatin  (CRESTOR ) 20 MG tablet Take 1 tablet (20 mg total) by mouth daily.   triamcinolone  cream (KENALOG ) 0.1 % Apply 1 Application topically 2 (two) times daily.   fluconazole  (DIFLUCAN ) 150 MG tablet Take 150 mg by mouth once. (Patient not taking: Reported on 12/08/2023)   traMADol  (ULTRAM ) 50 MG tablet Take 50 mg by mouth every 6 (six) hours as needed. (Patient not taking: Reported on 12/08/2023)   No facility-administered encounter medications on file as of 12/08/2023.    Allergies (verified) Xarelto  [rivaroxaban ]   History:  Past Medical History:  Diagnosis Date   Arthritis 2010   OA in knees   Breast cancer (HCC) 2016   right   Breast cancer of upper-inner quadrant of right female breast (HCC) 10/23/2014   Carpal tunnel syndrome 02/12/2015   Right   GERD (gastroesophageal reflux disease)    Hyperlipidemia    Personal history of radiation therapy    Primary localized osteoarthritis of left knee 07/12/2015   Primary localized osteoarthritis of right knee 05/03/2015   S/P radiation therapy 12/26/2014 through 01/29/2015                                                       Right breast 4800 cGy in 24 sessions                          Past Surgical History:  Procedure Laterality Date   BREAST LUMPECTOMY Right 11/13/2014   CHOLECYSTECTOMY  2011   DILATION AND CURETTAGE OF UTERUS     PARTIAL KNEE ARTHROPLASTY Right 05/03/2015   Procedure: RIGHT UNICOMPARTMENTAL KNEE;  Surgeon: Osa Blase, MD;  Location: [facility];  Service: Orthopedics;  Laterality: Right;   PARTIAL KNEE ARTHROPLASTY Left 07/12/2015   Procedure: LEFT UNICOMPARTMENTAL KNEE;  Surgeon: Osa Blase, MD;  Location: [facility];  Service: Orthopedics;  Laterality: Left;   RADIOACTIVE SEED GUIDED PARTIAL MASTECTOMY WITH AXILLARY SENTINEL LYMPH NODE BIOPSY Right 11/13/2014   Procedure: RIGHT PARTIAL MASTECTOMY WITH RADIOACTIVE SEED LOCALIZATION RIGHT  AXILLARY SENTINEL LYMPH NODE BIOPSY;  Surgeon: Boyce Byes, MD;  Location: [facility];  Service: General;  Laterality: Right;   TONSILLECTOMY  1948   TUBAL LIGATION     Family History  Problem Relation Age of Onset   Hypertension Mother    Alcohol abuse Father    Breast cancer Maternal Aunt    Social History   Socioeconomic History   Marital status: Widowed    Spouse name: Not on file   Number of children: Not on file   Years of education: Not on file   Highest education level: 12th grade  Occupational History   Not on file  Tobacco Use   Smoking status: Former    Current packs/day: 0.00    Types: Cigarettes    Quit date: 11/06/1995    Years since quitting: 28.1    Passive exposure: Past   Smokeless tobacco: Never  Substance and Sexual Activity   Alcohol use: Yes    Alcohol/week: 21.0 standard drinks of alcohol    Types: 14 Glasses of wine, 7 Cans of beer per week   Drug use: No   Sexual activity: Not Currently    Birth control/protection: Post-menopausal  Other Topics Concern   Not on file  Social History Narrative   Widowed   Social  Drivers of Health   Financial Resource Strain: Low Risk  (12/08/2023)   Overall Financial Resource Strain (CARDIA)    Difficulty of Paying Living Expenses: Not hard at all  Food Insecurity: No Food Insecurity (12/08/2023)   Hunger Vital Sign    Worried About Running Out of Food in the Last Year: Never true    Ran Out of Food in the Last Year: Never true  Transportation Needs: No Transportation Needs (12/08/2023)   PRAPARE - Transportation  Lack of Transportation (Medical): No    Lack of Transportation (Non-Medical): No  Physical Activity: Insufficiently Active (12/08/2023)   Exercise Vital Sign    Days of Exercise per Week: 1 day    Minutes of Exercise per Session: 20 min  Stress: No Stress Concern Present (12/08/2023)   [employer] of Occupational Health - Occupational Stress Questionnaire    Feeling of Stress : Not at all  Social Connections: Socially Isolated (12/08/2023)   Social Connection and Isolation Panel [NHANES]    Frequency of Communication with Friends and Family: Three times a week    Frequency of Social Gatherings with Friends and Family: Three times a week    Attends Religious Services: Never    Active Member of Clubs or Organizations: No    Attends Banker Meetings: Never    Marital Status: Widowed    Tobacco Counseling Counseling given: No    Clinical Intake:  Pre-visit preparation completed: Yes  Pain : No/denies pain     BMI - recorded: 36.15 Nutritional Risks: None Diabetes: No  Lab Results  Component Value Date   HGBA1C 6.3 08/05/2023   HGBA1C 6.1 09/02/2022   HGBA1C 6.1 11/03/2021     How often do you need to have someone help you when you read instructions, pamphlets, or other written materials from your doctor or pharmacy?: 1 - Never  Interpreter Needed?: No  Information entered by :: Kandy Orris, CMA   Activities of Daily Living     12/08/2023    1:30 PM 12/05/2023   10:02 AM  In your present state of health,  do you have any difficulty performing the following activities:  Hearing? 0 0  Vision? 0 0  Difficulty concentrating or making decisions? 0 0  Walking or climbing stairs? 0 0  Dressing or bathing? 0 0  Doing errands, shopping? 0 0  Preparing Food and eating ? N N  Using the Toilet? N N  In the past six months, have you accidently leaked urine? N N  Do you have problems with loss of bowel control? N N  Managing your Medications? N N  Managing your Finances? N N  Housekeeping or managing your Housekeeping? N N    Patient Care Team: Arcadio Knuckles, MD as PCP - General (Internal Medicine) Boyce Byes, MD as Consulting Physician (General Surgery) Cameron Cea, MD as Consulting Physician (Hematology and Oncology) Opal Bill, MD (Inactive) as Consulting Physician (Radiation Oncology) Auther Bo, RN as Registered Nurse Alane Hsu, RN as Registered Nurse Alanna Alley, NP (Inactive) as Nurse Practitioner (Nurse Practitioner) Osa Blase, MD as Consulting Physician (Orthopedic Surgery) Dama Duffel, NP as Nurse Practitioner (Hematology and Oncology) Fara Hone, OD as Consulting Physician (Optometry) Szabat, Tino Foreman, [facility] (Inactive) (Pharmacist) Thurmon Florida., MD (Ophthalmology)  Indicate any recent Medical Services you may have received from other than [facility] providers in the past year (date may be approximate).     Assessment:    This is a routine wellness examination for [facility].  Hearing/Vision screen Hearing Screening - Comments:: Denies hearing difficulties   Vision Screening - Comments:: Wears rx glasses - up to date with routine eye exams with Dr Rachel Budds   Goals Addressed               This Visit's Progress     Patient Stated (pt-stated)        Patient stated she wants to lose weight (lose about 90lbs)...walk more.  Depression Screen     12/08/2023    1:31 PM 11/23/2023    3:26 PM 08/05/2023    9:04 AM 12/07/2022     4:08 PM 09/02/2022    2:05 PM 12/24/2021   11:27 AM 11/28/2021    9:27 AM  PHQ 2/9 Scores  PHQ - 2 Score 0 0 0 0 0 1 0  PHQ- 9 Score 0 0 0 1 0 2     Fall Risk     12/08/2023    1:31 PM 12/05/2023   10:02 AM 11/23/2023    3:31 PM 08/05/2023    9:04 AM 12/07/2022    4:07 PM  Fall Risk   Falls in the past year? 0 0 0 0 0  Number falls in past yr: 0  0 0 0  Injury with Fall? 0  0 0 0  Risk for fall due to : No Fall Risks  No Fall Risks No Fall Risks No Fall Risks  Follow up Falls prevention discussed;Falls evaluation completed  Falls evaluation completed Falls evaluation completed Falls prevention discussed    MEDICARE RISK AT HOME:  Medicare Risk at Home Any stairs in or around the home?: Yes If so, are there any without handrails?: No Home free of loose throw rugs in walkways, pet beds, electrical cords, etc?: Yes Adequate lighting in your home to reduce risk of falls?: Yes Life alert?: No Use of a cane, walker or w/c?: No Grab bars in the bathroom?: Yes Shower chair or bench in shower?: No Elevated toilet seat or a handicapped toilet?: No  TIMED UP AND GO:  Was the test performed?  No  Cognitive Function: 6CIT completed        12/08/2023    1:34 PM 12/07/2022    4:16 PM  6CIT Screen  What Year? 0 points 0 points  What month? 0 points 0 points  What time? 0 points 0 points  Count back from 20 0 points 0 points  Months in reverse 0 points 0 points  Repeat phrase 0 points 0 points  Total Score 0 points 0 points    Immunizations Immunization History  Administered Date(s) Administered   PFIZER(Purple Top)SARS-COV-2 Vaccination 09/02/2019, 09/27/2019, 05/02/2020, 06/26/2021   Pneumococcal Conjugate-13 10/03/2014   Pneumococcal Polysaccharide-23 03/16/2016, 09/02/2022   Tdap 10/03/2014   Zoster Recombinant(Shingrix) 01/19/2018, 06/17/2018   Zoster, Live 03/16/2016    Screening Tests Health Maintenance  Topic Date Due   COVID-19 Vaccine (5 - 2024-25 season) 03/28/2023    INFLUENZA VACCINE  02/25/2024   DTaP/Tdap/Td (2 - Td or Tdap) 10/02/2024   Medicare Annual Wellness (AWV)  12/07/2024   Pneumonia Vaccine 53+ Years old  Completed   DEXA SCAN  Completed   Zoster Vaccines- Shingrix  Completed   HPV VACCINES  Aged Out   Meningococcal B Vaccine  Aged Out   Hepatitis C Screening  Discontinued    Health Maintenance  Health Maintenance Due  Topic Date Due   COVID-19 Vaccine (5 - 2024-25 season) 03/28/2023   Health Maintenance Items Addressed: 12/08/2023   Additional Screening:  Vision Screening: Recommended annual ophthalmology exams for early detection of glaucoma and other disorders of the eye.  Dental Screening: Recommended annual dental exams for proper oral hygiene  Community Resource Referral / Chronic Care Management: CRR required this visit?  No   CCM required this visit?  No   Plan:    I have personally reviewed and noted the following in the patient's chart:   Medical  and social history Use of alcohol, tobacco or illicit drugs  Current medications and supplements including opioid prescriptions. Patient is not currently taking opioid prescriptions. Functional ability and status Nutritional status Physical activity Advanced directives List of other physicians Hospitalizations, surgeries, and ER visits in previous 12 months Vitals Screenings to include cognitive, depression, and falls Referrals and appointments  In addition, I have reviewed and discussed with patient certain preventive protocols, quality metrics, and best practice recommendations. A written personalized care plan for preventive services as well as general preventive health recommendations were provided to patient.   Patria Bookbinder, CMA   12/08/2023   After Visit Summary: (MyChart) Due to this being a telephonic visit, the after visit summary with patients personalized plan was offered to patient via MyChart   Notes: Nothing significant to report at this  time.

## 2023-12-08 NOTE — Patient Instructions (Signed)
 Ms. Haider , Thank you for taking time out of your busy schedule to complete your Annual Wellness Visit with me. I enjoyed our conversation and look forward to speaking with you again next year. I, as well as your care team,  appreciate your ongoing commitment to your health goals. Please review the following plan we discussed and let me know if I can assist you in the future. Your Game plan/ To Do List Follow up Visits: Next Medicare AWV with our clinical staff: 12/11/2024   Have you seen your provider in the last 6 months (3 months if uncontrolled diabetes)? Yes Next Office Visit with your provider: 02/08/2024  Clinician Recommendations:  Aim for 30 minutes of exercise or brisk walking, 6-8 glasses of water, and 5 servings of fruits and vegetables each day. Educated and advised on getting the COVID vaccine in 2025.      This is a list of the screening recommended for you and due dates:  Health Maintenance  Topic Date Due   COVID-19 Vaccine (5 - 2024-25 season) 03/28/2023   Flu Shot  02/25/2024   DTaP/Tdap/Td vaccine (2 - Td or Tdap) 10/02/2024   Medicare Annual Wellness Visit  12/07/2024   Pneumonia Vaccine  Completed   DEXA scan (bone density measurement)  Completed   Zoster (Shingles) Vaccine  Completed   HPV Vaccine  Aged Out   Meningitis B Vaccine  Aged Out   Hepatitis C Screening  Discontinued    Advanced directives: (Copy Requested) Please bring a copy of your health care power of attorney and living will to the office to be added to your chart at your convenience. You can mail to [facility] [address] [place], [place] [address] or email to [email or web address] Advance Care Planning is important because it:  [x]  Makes sure you receive the medical care that is consistent with your values, goals, and preferences  [x]  It provides guidance to your family and loved ones and reduces their decisional burden about whether or not they are making the right  decisions based on your wishes.  Follow the link provided in your after visit summary or read over the paperwork we have mailed to you to help you started getting your Advance Directives in place. If you need assistance in completing these, please reach out to us  so that we can help you!

## 2023-12-23 DIAGNOSIS — C44529 Squamous cell carcinoma of skin of other part of trunk: Secondary | ICD-10-CM | POA: Diagnosis not present

## 2024-01-05 DIAGNOSIS — L578 Other skin changes due to chronic exposure to nonionizing radiation: Secondary | ICD-10-CM | POA: Diagnosis not present

## 2024-01-05 DIAGNOSIS — L821 Other seborrheic keratosis: Secondary | ICD-10-CM | POA: Diagnosis not present

## 2024-01-17 DIAGNOSIS — H9311 Tinnitus, right ear: Secondary | ICD-10-CM | POA: Diagnosis not present

## 2024-01-17 DIAGNOSIS — H903 Sensorineural hearing loss, bilateral: Secondary | ICD-10-CM | POA: Diagnosis not present

## 2024-01-17 DIAGNOSIS — H6122 Impacted cerumen, left ear: Secondary | ICD-10-CM | POA: Diagnosis not present

## 2024-02-05 ENCOUNTER — Other Ambulatory Visit: Payer: Self-pay | Admitting: Internal Medicine

## 2024-02-05 DIAGNOSIS — E785 Hyperlipidemia, unspecified: Secondary | ICD-10-CM

## 2024-02-08 ENCOUNTER — Ambulatory Visit: Admitting: Internal Medicine

## 2024-02-21 ENCOUNTER — Ambulatory Visit (INDEPENDENT_AMBULATORY_CARE_PROVIDER_SITE_OTHER): Admitting: Family Medicine

## 2024-02-21 ENCOUNTER — Other Ambulatory Visit: Payer: Self-pay | Admitting: Internal Medicine

## 2024-02-21 ENCOUNTER — Ambulatory Visit: Payer: Self-pay

## 2024-02-21 ENCOUNTER — Encounter: Payer: Self-pay | Admitting: Family Medicine

## 2024-02-21 VITALS — BP 138/76 | HR 74 | Temp 98.7°F | Resp 18 | Ht 66.0 in | Wt 224.0 lb

## 2024-02-21 DIAGNOSIS — R197 Diarrhea, unspecified: Secondary | ICD-10-CM | POA: Diagnosis not present

## 2024-02-21 NOTE — Telephone Encounter (Signed)
 FYI Only or Action Required?: FYI only for provider.  Patient was last seen in primary care on 11/23/2023 by Norleen Lynwood ORN, MD.  Called Nurse Triage reporting Diarrhea.  Symptoms began a week ago.  Interventions attempted: OTC medications: immodium without relief.  Symptoms are: unchanged.  Triage Disposition: See Physician Within 24 Hours  Patient/caregiver understands and will follow disposition?: Yes     Copied from CRM #[id number]. Topic: Clinical - Red Word Triage >> Feb 21, 2024  8:53 AM Revonda D wrote: Red Word that prompted transfer to Nurse Triage: Diarrhea   Pt stated that she has has diarrhea for the last 8 days and is still experiencing this. Pt is requesting to speak with a nurse for advice or if she could get prescribed antibiotics. Reason for Disposition  [1] MODERATE diarrhea (e.g., 4-6 times / day more than normal) AND [2] present > 48 hours (2 days)  Answer Assessment - Initial Assessment Questions 1. DIARRHEA SEVERITY: How bad is the diarrhea? How many more stools have you had in the past 24 hours than normal?      3-4 x  2. ONSET: When did the diarrhea begin?      X 8 days 3. STOOL DESCRIPTION:  How loose or watery is the diarrhea? What is the stool color? Is there any blood or mucous in the stool?     Bright yellow watery and loose 4. VOMITING: Are you also vomiting? If Yes, ask: How many times in the past 24 hours?      denies 5. ABDOMEN PAIN: Are you having any abdomen pain? If Yes, ask: What does it feel like? (e.g., crampy, dull, intermittent, constant)      denies 6. ABDOMEN PAIN SEVERITY: If present, ask: How bad is the pain?  (e.g., Scale 1-10; mild, moderate, or severe)     denies 7. ORAL INTAKE: If vomiting, Have you been able to drink liquids? How much liquids have you had in the past 24 hours?     [name] 8. HYDRATION: Any signs of dehydration? (e.g., dry mouth [not just dry lips], too weak to stand, dizziness, new weight  loss) When did you last urinate?     Denies Endorses able to keep hydrated 9. EXPOSURE: Have you traveled to a foreign country recently? Have you been exposed to anyone with diarrhea? Could you have eaten any food that was spoiled?     [name] 10. ANTIBIOTIC USE: Are you taking antibiotics now or have you taken antibiotics in the past 2 months?       denies 11. OTHER SYMPTOMS: Do you have any other symptoms? (e.g., fever, blood in stool)       denies 12. PREGNANCY: Is there any chance you are pregnant? When was your last menstrual period?       [name]  Protocols used: [facility]

## 2024-02-21 NOTE — Progress Notes (Signed)
 Assessment & Plan:  1. Diarrhea, unspecified type (Primary) Education provided on diarrhea.  Encouraged adequate hydration.  Will treat according to GI profile results. - GI Profile, Stool, PCR; Future   Follow up plan: Return if symptoms worsen or fail to improve.  Niki Rung, MSN, APRN, FNP-C  Subjective:  HPI: Stacey Navarro is a 83 y.o. female presenting on 02/21/2024 for Diarrhea (X 8 days - yellowish in color. Appetite is good, no abdominal pain )  Patient reports diarrhea started 8 days ago.  States she is having loose/watery yellow stools every time she eats or drinks anything.  Denies any nausea, vomiting, abdominal pain, and fever.  She denies any travel or suspicious foods.  History of cholecystectomy in her 46s.  She has taken Imodium A-D.  It states the first time she took 2 tablets of Imodium and the diarrhea stopped.  The next time she took 2 tablets with 1 every time she had diarrhea after that and it did not stop the diarrhea.    ROS: Negative unless specifically indicated above in HPI.   Relevant past medical history reviewed and updated as indicated.   Allergies and medications reviewed and updated.   Current Outpatient Medications:    anastrozole  (ARIMIDEX ) 1 MG tablet, Take 1 tablet (1 mg total) by mouth daily., Disp: 90 tablet, Rfl: 3   apixaban  (ELIQUIS ) 2.5 MG TABS tablet, Take 1 tablet (2.5 mg total) by mouth 2 (two) times daily., Disp: 180 tablet, Rfl: 1   buPROPion  (WELLBUTRIN  XL) 150 MG 24 hr tablet, Take 1 tablet (150 mg total) by mouth daily., Disp: 90 tablet, Rfl: 1   pantoprazole  (PROTONIX ) 40 MG tablet, Take 1 tablet (40 mg total) by mouth daily as needed., Disp: 90 tablet, Rfl: 1   rosuvastatin  (CRESTOR ) 20 MG tablet, Take 1 tablet by mouth once daily, Disp: 90 tablet, Rfl: 0  Allergies  Allergen Reactions   Xarelto  [Rivaroxaban ]     Rash    Objective:   BP 138/76   Pulse 74   Temp 98.7 F (37.1 C)   Resp 18   Ht 5' 6 (1.676 m)    Wt 224 lb (101.6 kg)   SpO2 97%   BMI 36.15 kg/m    Physical Exam Vitals reviewed.  Constitutional:      General: She is not in acute distress.    Appearance: Normal appearance. She is not ill-appearing, toxic-appearing or diaphoretic.  HENT:     Head: Normocephalic and atraumatic.  Eyes:     General: No scleral icterus.       Right eye: No discharge.        Left eye: No discharge.     Conjunctiva/sclera: Conjunctivae normal.  Cardiovascular:     Rate and Rhythm: Normal rate.  Pulmonary:     Effort: Pulmonary effort is normal. No respiratory distress.  Abdominal:     General: Bowel sounds are normal. There is no distension.     Palpations: Abdomen is soft. There is no mass.     Tenderness: There is no abdominal tenderness. There is no guarding or rebound.  Musculoskeletal:        General: Normal range of motion.     Cervical back: Normal range of motion.  Skin:    General: Skin is warm and dry.     Capillary Refill: Capillary refill takes less than 2 seconds.  Neurological:     General: No focal deficit present.     Mental Status: She  is alert and oriented to person, place, and time. Mental status is at baseline.  Psychiatric:        Mood and Affect: Mood normal.        Behavior: Behavior normal.        Thought Content: Thought content normal.        Judgment: Judgment normal.

## 2024-02-22 ENCOUNTER — Encounter: Payer: Self-pay | Admitting: Family Medicine

## 2024-02-22 LAB — GI PROFILE, STOOL, PCR

## 2024-02-22 LAB — SPECIMEN STATUS REPORT

## 2024-02-23 NOTE — Telephone Encounter (Signed)
 Copied from CRM [id number]. Topic: Clinical - Lab/Test Results >> Feb 23, 2024  8:46 AM Macario HERO wrote: Reason for CRM: Patient is requesting a call from Dr. Joshua or his nurse regarding lab test results. Advised should receive a call today before 3pm.

## 2024-02-24 ENCOUNTER — Ambulatory Visit: Payer: Self-pay | Admitting: Family Medicine

## 2024-02-24 MED ORDER — DICYCLOMINE HCL 10 MG PO CAPS: 10.0000 mg | ORAL_CAPSULE | Freq: Three times a day (TID) | ORAL | 0 refills | Status: DC

## 2024-02-24 NOTE — Telephone Encounter (Signed)
 Copied from CRM [id number]. Topic: Clinical - Lab/Test Results >> Feb 23, 2024  8:46 AM Macario HERO wrote: Reason for CRM: Patient is requesting a call from Dr. Joshua or his nurse regarding lab test results. Advised should receive a call today before 3pm. >> Feb 23, 2024  5:41 PM Chiquita SQUIBB wrote: Patient is calling in regarding these results, patient received them through mychart and would like to discuss them with the doctor or nurse.Please contact the patient back.  >> Feb 23, 2024  3:31 PM Robinson H wrote: Patient returning call to clinic regarding lab results, reached out to [facility] just waiting on Dr. Joshua to respond advised patient.

## 2024-02-24 NOTE — Telephone Encounter (Signed)
 Copied from CRM [id number]. Topic: Clinical - Lab/Test Results >> Feb 23, 2024  8:46 AM Macario HERO wrote: Reason for CRM: Patient is requesting a call from Dr. Joshua or his nurse regarding lab test results. Advised should receive a call today before 3pm. >> Feb 24, 2024 12:58 PM Chiquita SQUIBB wrote: Patient is calling in requesting the results today, as she stated Dr. Joshua would not be in the office tomorrow. Please advise the patient today if possible on an update.  >> Feb 23, 2024  5:41 PM Chiquita SQUIBB wrote: Patient is calling in regarding these results, patient received them through [facility] and would like to discuss them with the doctor or nurse.Please contact the patient back.  >> Feb 23, 2024  3:31 PM Robinson H wrote: Patient returning call to clinic regarding lab results, reached out to [facility] just waiting on Dr. Joshua to respond advised patient.

## 2024-03-01 ENCOUNTER — Telehealth: Payer: Self-pay | Admitting: Internal Medicine

## 2024-03-01 NOTE — Telephone Encounter (Signed)
 Copied from CRM #[id number]. Topic: General - Other >> Mar 01, 2024  2:49 PM Aisha D wrote: Reason for CRM: Pt is requesting to speak with Dr.Jones regarding an ongoing issue. Pt stated that Dr.Jones knows the issue and didn't go into detail. Pt would like a callback today.

## 2024-03-02 NOTE — Telephone Encounter (Signed)
 This patient was seen on the 31st by Britney for Diarrhea for three weeks and she was prescribed bentyl  for it. The patient states she's gotten a little better BUT she still can't leave the house because of this issue. She wants to know when will the medication start working completely, do she need another medication OR do she need to be seen again.

## 2024-03-03 ENCOUNTER — Other Ambulatory Visit: Payer: Self-pay | Admitting: Internal Medicine

## 2024-03-03 DIAGNOSIS — K529 Noninfective gastroenteritis and colitis, unspecified: Secondary | ICD-10-CM | POA: Insufficient documentation

## 2024-03-03 MED ORDER — DIPHENOXYLATE-ATROPINE 2.5-0.025 MG PO TABS: 1.0000 | ORAL_TABLET | Freq: Four times a day (QID) | ORAL | 0 refills | Status: DC | PRN

## 2024-03-03 NOTE — Telephone Encounter (Signed)
 Patient has been made aware and gave a verbal understanding.

## 2024-03-10 ENCOUNTER — Other Ambulatory Visit: Payer: Self-pay | Admitting: Internal Medicine

## 2024-03-10 ENCOUNTER — Telehealth: Payer: Self-pay | Admitting: Internal Medicine

## 2024-03-10 DIAGNOSIS — K529 Noninfective gastroenteritis and colitis, unspecified: Secondary | ICD-10-CM

## 2024-03-10 NOTE — Telephone Encounter (Signed)
 Would she need an appointment for this since she hasn't seen you since May ?

## 2024-03-10 NOTE — Telephone Encounter (Signed)
 Copied from CRM #[id number]. Topic: Referral - Request for Referral >> Mar 10, 2024  8:45 AM Thersia BROCKS wrote: Did the patient discuss referral with their provider in the last year? Yes (If No - schedule appointment) (If Yes - send message)  Appointment offered? Yes  Type of order/referral and detailed reason for visit: Gastroenterology  Preference of office, provider, location:  Belvie BIRCH.[facility], [place] [address] [place], [place] [address] [phone]  If referral order, have you been seen by this specialty before? No (If Yes, this issue or another issue? When? Where?  Can we respond through MyChart? Yes

## 2024-03-10 NOTE — Telephone Encounter (Unsigned)
 Copied from CRM #[id number]. Topic: Referral - Question >> Mar 10, 2024  2:35 PM Stacey Navarro wrote: Reason for CRM: Patient called in wanting to follow up on referral request advised the only update is a question regarding her needing an appointment. Please call [phone] to confirm if she can have referral sent or if an appointment is needed

## 2024-03-11 NOTE — Telephone Encounter (Signed)
 She needs to be seen.

## 2024-03-12 ENCOUNTER — Other Ambulatory Visit: Payer: Self-pay | Admitting: Family Medicine

## 2024-03-12 ENCOUNTER — Other Ambulatory Visit: Payer: Self-pay | Admitting: Internal Medicine

## 2024-03-12 DIAGNOSIS — F322 Major depressive disorder, single episode, severe without psychotic features: Secondary | ICD-10-CM

## 2024-03-13 ENCOUNTER — Other Ambulatory Visit: Payer: Self-pay | Admitting: Internal Medicine

## 2024-03-13 DIAGNOSIS — K529 Noninfective gastroenteritis and colitis, unspecified: Secondary | ICD-10-CM

## 2024-03-13 DIAGNOSIS — F322 Major depressive disorder, single episode, severe without psychotic features: Secondary | ICD-10-CM

## 2024-03-13 NOTE — Telephone Encounter (Signed)
 Copied from CRM #[id number]. Topic: Clinical - Red Word Triage >> Mar 13, 2024  3:43 PM Jayma L wrote: Red Word that prompted transfer to Nurse Triage: diarrhea is getting worse, needs refills asap -  Diphenoxylate -Atropine  2.5-0.025 MG (TAKE 1 TABLET BY MOUTH 4 TIMES DAILY AS NEEDED FOR  DIARRHEA  OR  LOOSE  STOOLS) This encounter was created in error - please disregard.

## 2024-03-13 NOTE — Telephone Encounter (Signed)
 FYI Only or Action Required?: Action required by provider: medication refill request.  Patient was last seen in primary care on 02/21/2024 by Merlynn Niki FALCON, FNP.  Called Nurse Triage reporting Medication Refill.  Symptoms began several days ago.  Interventions attempted: Nothing.  Symptoms are: gradually worsening.  Triage Disposition: No disposition on file.  Patient/caregiver understands and will follow disposition?: Yes

## 2024-03-14 ENCOUNTER — Other Ambulatory Visit: Payer: Self-pay

## 2024-03-14 DIAGNOSIS — K529 Noninfective gastroenteritis and colitis, unspecified: Secondary | ICD-10-CM

## 2024-03-14 DIAGNOSIS — F322 Major depressive disorder, single episode, severe without psychotic features: Secondary | ICD-10-CM

## 2024-03-14 MED ORDER — BUPROPION HCL ER (XL) 150 MG PO TB24: 150.0000 mg | ORAL_TABLET | Freq: Every day | ORAL | 0 refills | Status: DC

## 2024-03-14 NOTE — Telephone Encounter (Unsigned)
 Copied from CRM #[id number]. Topic: Clinical - Medication Prior Auth >> Mar 14, 2024  1:30 PM Jasmin G wrote: Reason for CRM: Pt called regarding the status of pre authorization for meds placed yesterday, please refer to recent Encounter, please give her a call back at [phone] to update status ASAP as she is a little concerned on the turnaround time for her med refills, she stated that her PCP usually doesn't take this long.

## 2024-03-14 NOTE — Telephone Encounter (Signed)
 She declined the visit and said forget the referral.

## 2024-03-14 NOTE — Telephone Encounter (Signed)
 Copied from CRM #[id number]. Topic: Clinical - Medication Prior Auth >> Mar 14, 2024  1:30 PM Jasmin G wrote: Reason for CRM: Pt called regarding the status of pre authorization for meds placed yesterday, please refer to recent Encounter, please give her a call back at [phone] to update status ASAP as she is a little concerned on the turnaround time for her med refills, she stated that her PCP usually doesn't take this long. This encounter was created in error - please disregard.

## 2024-03-15 NOTE — Telephone Encounter (Unsigned)
 Copied from CRM [id number]. Topic: Clinical - Prescription Issue >> Mar 14, 2024  2:47 PM Drema MATSU wrote: Reason for CRM: Patient is going out of town this weekend and is needing the refill on Bupropion . *Advised patient of turn around time

## 2024-03-16 ENCOUNTER — Encounter: Payer: Self-pay | Admitting: Gastroenterology

## 2024-03-16 ENCOUNTER — Other Ambulatory Visit (INDEPENDENT_AMBULATORY_CARE_PROVIDER_SITE_OTHER)

## 2024-03-16 ENCOUNTER — Ambulatory Visit (INDEPENDENT_AMBULATORY_CARE_PROVIDER_SITE_OTHER): Admitting: Gastroenterology

## 2024-03-16 VITALS — BP 108/70 | HR 77 | Ht 66.0 in | Wt 225.0 lb

## 2024-03-16 DIAGNOSIS — R197 Diarrhea, unspecified: Secondary | ICD-10-CM

## 2024-03-16 DIAGNOSIS — R152 Fecal urgency: Secondary | ICD-10-CM

## 2024-03-16 DIAGNOSIS — L905 Scar conditions and fibrosis of skin: Secondary | ICD-10-CM | POA: Diagnosis not present

## 2024-03-16 LAB — COMPREHENSIVE METABOLIC PANEL WITH GFR
ALT: 25 U/L (ref 0–35)
AST: 23 U/L (ref 0–37)
Albumin: 4.2 g/dL (ref 3.5–5.2)
Alkaline Phosphatase: 104 U/L (ref 39–117)
BUN: 16 mg/dL (ref 6–23)
CO2: 26 meq/L (ref 19–32)
Calcium: 9.3 mg/dL (ref 8.4–10.5)
Chloride: 106 meq/L (ref 96–112)
Creatinine, Ser: 0.93 mg/dL (ref 0.40–1.20)
GFR: 57.67 mL/min — ABNORMAL LOW (ref >60.00)
Glucose, Bld: 107 mg/dL — ABNORMAL HIGH (ref 70–99)
Potassium: 3.9 meq/L (ref 3.5–5.1)
Sodium: 142 meq/L (ref 135–145)
Total Bilirubin: 0.5 mg/dL (ref 0.2–1.2)
Total Protein: 7.1 g/dL (ref 6.0–8.3)

## 2024-03-16 LAB — CBC WITH DIFFERENTIAL/PLATELET
Basophils Absolute: 0 K/uL (ref 0.0–0.1)
Basophils Relative: 0.6 % (ref 0.0–3.0)
Eosinophils Absolute: 0.2 K/uL (ref 0.0–0.7)
Eosinophils Relative: 2.3 % (ref 0.0–5.0)
HCT: 41 % (ref 36.0–46.0)
Hemoglobin: 13.5 g/dL (ref 12.0–15.0)
Lymphocytes Relative: 29.5 % (ref 12.0–46.0)
Lymphs Abs: 1.9 K/uL (ref 0.7–4.0)
MCHC: 32.8 g/dL (ref 30.0–36.0)
MCV: 95.2 fl (ref 78.0–100.0)
Monocytes Absolute: 0.6 K/uL (ref 0.1–1.0)
Monocytes Relative: 8.9 % (ref 3.0–12.0)
Neutro Abs: 3.9 K/uL (ref 1.4–7.7)
Neutrophils Relative %: 58.7 % (ref 43.0–77.0)
Platelets: 265 K/uL (ref 150.0–400.0)
RBC: 4.31 Mil/uL (ref 3.87–5.11)
RDW: 14.1 % (ref 11.5–15.5)
WBC: 6.6 K/uL (ref 4.0–10.5)

## 2024-03-16 LAB — MAGNESIUM: Magnesium: 2 mg/dL (ref 1.5–2.5)

## 2024-03-16 LAB — C-REACTIVE PROTEIN: CRP: <1 mg/dL (ref 0.5–20.0)

## 2024-03-16 NOTE — Progress Notes (Addendum)
 Chief Complaint: acute diarrhea Primary GI Doctor: Dr. Suzann  HPI:  Patient is a  83  year old female patient with past medical history of Osteoarthritis,  Hyperlipidemia, history of DVT, history of Breast CA, who was self referred to me for a evaluation of diarrhea .    Interval History     Patient presents for evaluation for diarrhea that started about 4 weeks ago. No recent travel. No exposure. No new medications. She reports initially when she had the symptoms every time she ate or drank she had urgent diarrhea. She now only has 1-2 BM's per day but still loose and the urgency is intermittent.  She is currently taking bentyl  1 capsule QID. She was also took Lomotil  for about a week. She thinks the bentyl  helped more with her symptoms. No nocturnal symptoms.  No abdominal pain or blood in stool. Patient never had colonoscopy.   Patient has history of GERD and taking Pantoprazole  40mg  prn, she tells me very seldom.  Patient denies dysphagia. Patient denies nausea, vomiting, or weight loss.  Nonsmoker. She drinks wine occasionally.   Patient taking Eliquis  2.5mg  twice daily for history of DVT.   Surgical history: carpel tunnel surgeries, skin CA removed  Patient's family history: none  Wt Readings from Last 3 Encounters:  03/16/24 225 lb (102.1 kg)  02/21/24 224 lb (101.6 kg)  12/08/23 224 lb (101.6 kg)     Past Medical History:  Diagnosis Date   Arthritis 2010   OA in knees   Breast cancer (HCC) 2016   right   Breast cancer of upper-inner quadrant of right female breast (HCC) 10/23/2014   Carpal tunnel syndrome 02/12/2015   Right   Carpal tunnel syndrome    GERD (gastroesophageal reflux disease)    Hyperlipidemia    Personal history of radiation therapy    Primary localized osteoarthritis of left knee 07/12/2015   Primary localized osteoarthritis of right knee 05/03/2015   S/P radiation therapy 12/26/2014 through 01/29/2015                                                       Right breast 4800 cGy in 24 sessions                           Past Surgical History:  Procedure Laterality Date   BREAST LUMPECTOMY Right 11/13/2014   CHOLECYSTECTOMY  2011   DILATION AND CURETTAGE OF UTERUS     PARTIAL KNEE ARTHROPLASTY Right 05/03/2015   Procedure: RIGHT UNICOMPARTMENTAL KNEE;  Surgeon: Fonda Olmsted, MD;  Location: [facility];  Service: Orthopedics;  Laterality: Right;   PARTIAL KNEE ARTHROPLASTY Left 07/12/2015   Procedure: LEFT UNICOMPARTMENTAL KNEE;  Surgeon: Fonda Olmsted, MD;  Location: [facility];  Service: Orthopedics;  Laterality: Left;   RADIOACTIVE SEED GUIDED PARTIAL MASTECTOMY WITH AXILLARY SENTINEL LYMPH NODE BIOPSY Right 11/13/2014   Procedure: RIGHT PARTIAL MASTECTOMY WITH RADIOACTIVE SEED LOCALIZATION RIGHT  AXILLARY SENTINEL LYMPH NODE BIOPSY;  Surgeon: Elon Pacini, MD;  Location: [facility];  Service: General;  Laterality: Right;   SKIN CANCER EXCISION     TONSILLECTOMY  1948   TUBAL LIGATION      Current Outpatient Medications  Medication Sig Dispense Refill   anastrozole  (ARIMIDEX ) 1 MG  tablet Take 1 tablet (1 mg total) by mouth daily. 90 tablet 3   apixaban  (ELIQUIS ) 2.5 MG TABS tablet Take 1 tablet (2.5 mg total) by mouth 2 (two) times daily. 180 tablet 1   buPROPion  (WELLBUTRIN  XL) 150 MG 24 hr tablet Take 1 tablet (150 mg total) by mouth daily. 90 tablet 0   diphenoxylate -atropine  (LOMOTIL ) 2.5-0.025 MG tablet Take 1 tablet by mouth 4 (four) times daily as needed for diarrhea or loose stools. 30 tablet 0   pantoprazole  (PROTONIX ) 40 MG tablet Take 1 tablet (40 mg total) by mouth daily as needed. 90 tablet 1   rosuvastatin  (CRESTOR ) 20 MG tablet Take 1 tablet by mouth once daily 90 tablet 0   No current facility-administered medications for this visit.    Allergies as of 03/16/2024 - Review Complete 03/16/2024  Allergen Reaction Noted   Xarelto  [rivaroxaban ]  03/13/2019    Family  History  Problem Relation Age of Onset   Hypertension Mother    Alcohol abuse Father    Breast cancer Maternal Aunt     Review of Systems:    Constitutional: No weight loss, fever, chills, weakness or fatigue HEENT: Eyes: No change in vision               Ears, Nose, Throat:  No change in hearing or congestion Skin: No rash or itching Cardiovascular: No chest pain, chest pressure or palpitations   Respiratory: No SOB or cough Gastrointestinal: See HPI and otherwise negative Genitourinary: No dysuria or change in urinary frequency Neurological: No headache, dizziness or syncope Musculoskeletal: No new muscle or joint pain Hematologic: No bleeding or bruising Psychiatric: No history of depression or anxiety    Physical Exam:  Vital signs: BP 108/70   Pulse 77   Ht 5' 6 (1.676 m)   Wt 225 lb (102.1 kg)   BMI 36.32 kg/m   Constitutional:   Pleasant female appears to be in NAD, Well developed, Well nourished, alert and cooperative Throat: Oral cavity and pharynx without inflammation, swelling or lesion.  Respiratory: Respirations even and unlabored. Lungs clear to auscultation bilaterally.   No wheezes, crackles, or rhonchi.  Cardiovascular: Normal S1, S2. Regular rate and rhythm. No peripheral edema, cyanosis or pallor.  Gastrointestinal:  Soft, nondistended, nontender. No rebound or guarding. Hypoactive bowel sounds. No appreciable masses or hepatomegaly. Rectal:  Not performed.  Msk:  Symmetrical without gross deformities. Without edema, no deformity or joint abnormality.  Neurologic:  Alert and  oriented x4;  grossly normal neurologically.  Skin:   Dry and intact without significant lesions or rashes.  RELEVANT LABS AND IMAGING: CBC    Latest Ref Rng & Units 08/05/2023   10:05 AM 09/02/2022    2:44 PM 12/24/2021   12:12 PM  CBC  WBC 4.0 - 10.5 K/uL 6.6  8.0  6.9   Hemoglobin 12.0 - 15.0 g/dL 85.7  85.6  86.9   Hematocrit 36.0 - 46.0 % 43.7  42.3  39.2   Platelets 150.0  - 400.0 K/uL 288.0  285.0  245.0      CMP     Latest Ref Rng & Units 08/05/2023   10:05 AM 09/02/2022    2:44 PM 11/03/2021   11:04 AM  CMP  Glucose 70 - 99 mg/dL 888  890  883   BUN 6 - 23 mg/dL 27  18  21    Creatinine 0.40 - 1.20 mg/dL 8.92  8.99  9.16   Sodium 135 - 145 mEq/L 140  141  140   Potassium 3.5 - 5.1 mEq/L 4.3  4.4  4.5   Chloride 96 - 112 mEq/L 104  106  105   CO2 19 - 32 mEq/L 28  26  28    Calcium  8.4 - 10.5 mg/dL 89.7  89.9  9.9   Total Protein 6.0 - 8.3 g/dL 7.3  7.8  7.2   Total Bilirubin 0.2 - 1.2 mg/dL 0.4  0.5  0.4   Alkaline Phos 39 - 117 U/L 103  101  105   AST 0 - 37 U/L 32  30  31   ALT 0 - 35 U/L 31  33  34      Lab Results  Component Value Date   TSH 3.21 08/05/2023  02/21/24 GI profile negative  02/2015 Cologuard negative  5/22 Abd u/s RUQ IMPRESSION: 1. No acute abnormalities. No biliary dilatation. 2. Moderate hepatic steatosis.   Assessment: Encounter Diagnoses  Name Primary?   Diarrhea, unspecified type Yes   Fecal urgency     83 year old female patient that presents with acute painless diarrhea with urgency.  No rectal bleeding.  Negative GI profile.  No known triggers or risk factors noted today.  Upon examination she had hypoactive bowel sounds we will go ahead and get abdominal x-ray downstairs to rule out obstipation as there seems to be confusion to if her bowels were normal prior to this.  Will also order lab work to rule out any electrolyte imbalances.  Will also check inflammatory markers to rule out any inflammatory disease.  Patient has never had colonoscopy and recommended we proceed with random biopsy to rule out colitis.  Patient would like to hold off for now.  Patient continue to use Bentyl  before meals and at bedtime.  We also spent several minutes discussing dietary modifications.  History of DVT (on Eliquis )  History of Breast CA, Rt Lumpectomy 11/14/14  -Treatment plan: antiestrogen therapy with anastrozole  1 mg daily started  02/14/2015, plan for 10 years.    Plan: - Check CBC, CMP, Magnesium  , CRP,TTG IgA, IgA, -Order abdominal xray 2 view r/o obstipation - Continue Bentyl  1 capsule four times daily -avoid caffeine, dairy, fructose -Check fecal calprotectin - recommend colonoscopy, she would like to hold off until workup complete  Thank you for the courtesy of this consult. Please call me with any questions or concerns.   Ples Trudel, FNP-C [facility] 03/16/2024, 1:57 PM  Cc: Joshua Debby CROME, MD  I have reviewed the clinic note as outlined by Cathryne Beal, NP and agree with the assessment, plan and medical decision making.  Ms. Jersey presents to the office today for evaluation of a 4-week history of nonbloody diarrhea and fecal urgency.  Stool studies negative for enteric pathogens.  Agree with laboratory and stool workup.  Rule out overflow encopresis.  Given patient's age reasonable to defer colonoscopy unless other tests are unremarkable and symptoms are not improving.  Inocente Hausen, MD

## 2024-03-16 NOTE — Patient Instructions (Signed)
 Your provider has requested that you have an abdominal x ray before leaving today. Please go to the basement floor to our Radiology department for the test.  Your provider has requested that you go to the basement level for lab work before leaving today. Press B on the elevator. The lab is located at the first door on the left as you exit the elevator.  _______________________________________________________  If your blood pressure at your visit was 140/90 or greater, please contact your primary care physician to follow up on this.  _______________________________________________________  If you are age 81 or older, your body mass index should be between 23-30. Your Body mass index is 36.32 kg/m. If this is out of the aforementioned range listed, please consider follow up with your Primary Care Provider.  If you are age 31 or younger, your body mass index should be between 19-25. Your Body mass index is 36.32 kg/m. If this is out of the aformentioned range listed, please consider follow up with your Primary Care Provider.   ________________________________________________________  The [facility] providers would like to encourage you to use MYCHART to communicate with providers for non-urgent requests or questions.  Due to long hold times on the telephone, sending your provider a message by [facility] may be a faster and more efficient way to get a response.  Please allow 48 business hours for a response.  Please remember that this is for non-urgent requests.  _______________________________________________________  [facility] is using a team-based approach to care.  Your team is made up of your doctor and two to three APPS. Our APPS (Nurse Practitioners and Physician Assistants) work with your physician to ensure care continuity for you. They are fully qualified to address your health concerns and develop a treatment plan. They communicate directly with your gastroenterologist to  care for you. Seeing the Advanced Practice Practitioners on your physician's team can help you by facilitating care more promptly, often allowing for earlier appointments, access to diagnostic testing, procedures, and other specialty referrals.   Thank you for trusting me with your gastrointestinal care. Deanna May, FNP-C

## 2024-03-16 NOTE — Progress Notes (Signed)
 Medication refill was sent and denied by Dr. Joshua because the patient needs an appointment and she keeps declining it. I advised patient of this and she gave a verbal understanding.

## 2024-03-17 ENCOUNTER — Other Ambulatory Visit

## 2024-03-17 DIAGNOSIS — R197 Diarrhea, unspecified: Secondary | ICD-10-CM

## 2024-03-17 DIAGNOSIS — R152 Fecal urgency: Secondary | ICD-10-CM

## 2024-03-18 LAB — TISSUE TRANSGLUTAMINASE ABS,IGG,IGA
(tTG) Ab, IgA: <1 U/mL
(tTG) Ab, IgG: <1 U/mL

## 2024-03-18 LAB — IGA: Immunoglobulin A: 358 mg/dL — ABNORMAL HIGH (ref 70–320)

## 2024-03-20 ENCOUNTER — Ambulatory Visit: Payer: Self-pay | Admitting: Gastroenterology

## 2024-03-21 ENCOUNTER — Ambulatory Visit: Payer: Self-pay | Admitting: Gastroenterology

## 2024-03-21 LAB — CALPROTECTIN, FECAL: Calprotectin, Fecal: 25 ug/g (ref 0–120)

## 2024-04-05 ENCOUNTER — Other Ambulatory Visit: Payer: Self-pay | Admitting: Internal Medicine

## 2024-04-05 DIAGNOSIS — D6859 Other primary thrombophilia: Secondary | ICD-10-CM

## 2024-04-12 ENCOUNTER — Ambulatory Visit: Admitting: Internal Medicine

## 2024-04-24 ENCOUNTER — Encounter: Payer: Self-pay | Admitting: Internal Medicine

## 2024-04-24 ENCOUNTER — Ambulatory Visit: Admitting: Internal Medicine

## 2024-04-24 VITALS — BP 122/76 | HR 76 | Temp 98.4°F | Ht 66.0 in | Wt 225.0 lb

## 2024-04-24 DIAGNOSIS — E785 Hyperlipidemia, unspecified: Secondary | ICD-10-CM | POA: Diagnosis not present

## 2024-04-24 DIAGNOSIS — K219 Gastro-esophageal reflux disease without esophagitis: Secondary | ICD-10-CM

## 2024-04-24 DIAGNOSIS — C44529 Squamous cell carcinoma of skin of other part of trunk: Secondary | ICD-10-CM | POA: Insufficient documentation

## 2024-04-24 DIAGNOSIS — R7303 Prediabetes: Secondary | ICD-10-CM

## 2024-04-24 DIAGNOSIS — Z85828 Personal history of other malignant neoplasm of skin: Secondary | ICD-10-CM | POA: Insufficient documentation

## 2024-04-24 DIAGNOSIS — D6859 Other primary thrombophilia: Secondary | ICD-10-CM | POA: Diagnosis not present

## 2024-04-24 MED ORDER — ROSUVASTATIN CALCIUM 20 MG PO TABS: 20.0000 mg | ORAL_TABLET | Freq: Every day | ORAL | 0 refills | Status: DC

## 2024-04-24 MED ORDER — COVID-19 MRNA VAC-TRIS(PFIZER) 30 MCG/0.3ML IM SUSY
0.3000 mL | PREFILLED_SYRINGE | Freq: Once | INTRAMUSCULAR | 0 refills | Status: AC
Start: 2024-04-24 — End: 2024-04-24

## 2024-04-24 NOTE — Progress Notes (Signed)
 Subjective:  Patient ID: Stacey Navarro, female    DOB: 07-18-1941  Age: 83 y.o. MRN: [id number]  CC: Gastroesophageal Reflux and Hyperlipidemia   HPI Stacey Navarro presents for f/up ----  Discussed the use of AI scribe software for clinical note transcription with the patient, who gave verbal consent to proceed.  History of Present Illness Stacey Navarro is an 83 year old female who presents for follow-up regarding her medication and symptoms.  She experiences intermittent shortness of breath during strenuous activities, such as unhooking her truck from a trailer, without associated chest pain. The shortness of breath varies with activity level.  She is taking Eliquis  for blood thinning. She denies any bleeding or bruising related to the medication.  Her diarrhea has mostly resolved.  She takes pantoprazole  for heartburn or indigestion but seldom uses it now as her symptoms have improved. No recent heartburn or indigestion.  No recent changes in weight or appetite and no irregular heartbeats.     Outpatient Medications Prior to Visit  Medication Sig Dispense Refill   anastrozole  (ARIMIDEX ) 1 MG tablet Take 1 tablet (1 mg total) by mouth daily. 90 tablet 3   buPROPion  (WELLBUTRIN  XL) 150 MG 24 hr tablet Take 1 tablet (150 mg total) by mouth daily. 90 tablet 0   ELIQUIS  2.5 MG TABS tablet Take 1 tablet by mouth twice daily 180 tablet 0   pantoprazole  (PROTONIX ) 40 MG tablet Take 1 tablet (40 mg total) by mouth daily as needed. 90 tablet 1   diphenoxylate -atropine  (LOMOTIL ) 2.5-0.025 MG tablet Take 1 tablet by mouth 4 (four) times daily as needed for diarrhea or loose stools. 30 tablet 0   rosuvastatin  (CRESTOR ) 20 MG tablet Take 1 tablet by mouth once daily 90 tablet 0   No facility-administered medications prior to visit.    ROS Review of Systems  Constitutional: Negative.  Negative for diaphoresis and fatigue.  HENT: Negative.    Eyes: Negative.   Respiratory:  Negative.  Negative for cough, chest tightness and wheezing.   Cardiovascular:  Negative for chest pain, palpitations and leg swelling.  Gastrointestinal:  Positive for diarrhea. Negative for abdominal pain, constipation, nausea and vomiting.  Genitourinary: Negative.  Negative for difficulty urinating.  Musculoskeletal: Negative.  Negative for arthralgias and myalgias.  Neurological: Negative.  Negative for dizziness and weakness.  Hematological:  Negative for adenopathy. Does not bruise/bleed easily.  Psychiatric/Behavioral:  Positive for confusion and decreased concentration. Negative for sleep disturbance. The patient is nervous/anxious.     Objective:  BP 122/76 (BP Location: Left Arm, Patient Position: Sitting, Cuff Size: Normal)   Pulse 76   Temp 98.4 F (36.9 C) (Oral)   Ht 5' 6 (1.676 m)   Wt 225 lb (102.1 kg)   SpO2 95%   BMI 36.32 kg/m   BP Readings from Last 3 Encounters:  04/24/24 122/76  03/16/24 108/70  02/21/24 138/76    Wt Readings from Last 3 Encounters:  04/24/24 225 lb (102.1 kg)  03/16/24 225 lb (102.1 kg)  02/21/24 224 lb (101.6 kg)    Physical Exam Vitals reviewed.  Constitutional:      Appearance: Normal appearance.  HENT:     Nose: Nose normal.     Mouth/Throat:     Mouth: Mucous membranes are moist.  Eyes:     General: No scleral icterus.    Conjunctiva/sclera: Conjunctivae normal.  Cardiovascular:     Rate and Rhythm: Normal rate and regular rhythm.  Heart sounds: No murmur heard.    No friction rub. No gallop.  Pulmonary:     Effort: Pulmonary effort is normal.     Breath sounds: No stridor. No wheezing, rhonchi or rales.  Abdominal:     General: Abdomen is flat.     Palpations: There is no mass.     Tenderness: There is no abdominal tenderness. There is no guarding.     Hernia: No hernia is present.  Musculoskeletal:        General: Normal range of motion.     Cervical back: Neck supple.     Right lower leg: No edema.      Left lower leg: No edema.  Lymphadenopathy:     Cervical: No cervical adenopathy.  Skin:    General: Skin is warm and dry.  Neurological:     General: No focal deficit present.     Mental Status: She is alert.  Psychiatric:        Mood and Affect: Mood normal.        Behavior: Behavior normal.     Lab Results  Component Value Date   WBC 6.6 03/16/2024   HGB 13.5 03/16/2024   HCT 41.0 03/16/2024   PLT 265.0 03/16/2024   GLUCOSE 107 (H) 03/16/2024   CHOL 168 08/05/2023   TRIG 129.0 08/05/2023   HDL 53.10 08/05/2023   LDLDIRECT 158.4 07/17/2011   LDLCALC 89 08/05/2023   ALT 25 03/16/2024   AST 23 03/16/2024   NA 142 03/16/2024   K 3.9 03/16/2024   CL 106 03/16/2024   CREATININE 0.93 03/16/2024   BUN 16 03/16/2024   CO2 26 03/16/2024   TSH 3.21 08/05/2023   INR 1.2 (H) 08/05/2023   HGBA1C 6.3 08/05/2023    DG Bone Density Result Date: 11/24/2023 EXAM: DUAL X-RAY ABSORPTIOMETRY (DXA) FOR BONE MINERAL DENSITY 11/24/2023 11:32 am CLINICAL DATA:  83 year old Female Postmenopausal. Screening for osteoporosis TECHNIQUE: An axial (e.g., hips, spine) and/or appendicular (e.g., radius) exam was performed, as appropriate, using [employer] Secretary/administrator at [employer] [facility] [employer]. Images are obtained for bone mineral density measurement and are not obtained for diagnostic purposes. [id number] Exclusions: Lumbar spine due to degenerative changes. COMPARISON:  03/10/2019. FINDINGS: Scan quality: Good. LEFT FEMORAL NECK: BMD (in g/cm2): 0.707 T-score: -2.4 Z-score: -0.2 LEFT TOTAL HIP: BMD (in g/cm2): 0.805 T-score: -1.6 Z-score: 0.4 RIGHT FEMORAL NECK: BMD (in g/cm2): 0.677 T-score: -2.6 Z-score: -0.4 RIGHT TOTAL HIP: BMD (in g/cm2): 0.780 T-score: -1.8 Z-score: 0.2 DUAL-FEMUR TOTAL MEAN: Rate of change from previous exam: No significant rate of change from previous exam. LEFT FOREARM (RADIUS 33%): BMD (in g/cm2): 0.725 T-score: -1.8 Z-score: 1.0 Rate of change from  previous exam: -7.8 % FRAX 10-YEAR PROBABILITY OF FRACTURE: FRAX not reported as the lowest BMD is not in the osteopenia range. IMPRESSION: Osteoporosis based on BMD. Fracture risk is increased. Increased risk is based on low BMD. RECOMMENDATIONS: 1. All patients should optimize calcium  and vitamin D intake. 2. Consider FDA-approved medical therapies in postmenopausal women and men aged 27 years and older, based on the following: - A hip or vertebral (clinical or morphometric) fracture - T-score less than or equal to -2.5 and secondary causes have been excluded. - Low bone mass (T-score between -1.0 and -2.5) and a 10-year probability of a hip fracture greater than or equal to 3% or a 10-year probability of a major osteoporosis-related fracture greater than or equal to 20% based  on the [place] -adapted WHO algorithm. - Clinician judgment and/or patient preferences may indicate treatment for people with 10-year fracture probabilities above or below these levels 3. Patients with diagnosis of osteoporosis or at high risk for fracture should have regular bone mineral density tests. For patients eligible for Medicare, routine testing is allowed once every 2 years. The testing frequency can be increased to one year for patients who have rapidly progressing disease, those who are receiving or discontinuing medical therapy to restore bone mass, or have additional risk factors. Electronically Signed   By: Dina  Arceo M.D.   On: 11/24/2023 12:31    Assessment & Plan:   Gastroesophageal reflux disease without esophagitis- Sx's are well controlled.  Hyperlipidemia with target LDL less than 130- LDL goal achieved. Doing well on the statin  -     Rosuvastatin  Calcium ; Take 1 tablet (20 mg total) by mouth daily.  Dispense: 90 tablet; Refill: 0  Hypercoagulable state, primary- Will continue the DOAC.  Prediabetes -     Hemoglobin A1c; Future  Other orders -     COVID-19 mRNA Vac-TriS(Pfizer); Inject 0.3 mLs into the  muscle once for 1 dose.  Dispense: 0.3 mL; Refill: 0     Follow-up: No follow-ups on file.  Debby Molt, MD

## 2024-05-02 ENCOUNTER — Other Ambulatory Visit: Payer: Self-pay | Admitting: Hematology and Oncology

## 2024-05-02 DIAGNOSIS — C50211 Malignant neoplasm of upper-inner quadrant of right female breast: Secondary | ICD-10-CM

## 2024-05-05 ENCOUNTER — Other Ambulatory Visit: Payer: Self-pay | Admitting: Hematology and Oncology

## 2024-05-05 DIAGNOSIS — Z1231 Encounter for screening mammogram for malignant neoplasm of breast: Secondary | ICD-10-CM

## 2024-05-16 ENCOUNTER — Inpatient Hospital Stay: Admitting: Hematology and Oncology

## 2024-05-16 NOTE — Assessment & Plan Note (Deleted)
 Rt Lumpectomy 11/14/14: IDC Grade 3, 2.7 cm size, 0/3 LN , Margins clear; T2N0 Stage IIA Right breast invasive ductal carcinoma diagnosed with screening mammogram, 3.1 cm by ultrasound in the upper quadrant 6 cm from the nipple, grade 2-3, EF 100%, PR 48%, HER-2 negative ratio 0.6, Ki-67 was 28%, Oncotype DX score 19, 12% risk of recurrence, status post radiation 4800 cGy in 24 sessions completed 01/29/2015   Treatment plan: antiestrogen therapy with anastrozole  1 mg daily started 02/14/2015, plan for 10 years. She wants to take it longer but I am concerned about her bone density and her inability to take bisphosphonates We will order a bone density test.   Anastrozole  toxicities: Tolerating it very well without any side effects   Breast Cancer Surveillance: 1. Breast exam 05/16/2024: Benign 2. Mammograms scheduled for 05/26/2024 3. Bone density: 11/24/2023: T score -2.6 (was -2.5 in 2020): Osteoporosis   Osteoporosis: Recommended calcium  vitamin D and bisphosphonate therapy.  She did not want to take bisphosphonates.     Return to clinic in 1 year for follow-up

## 2024-05-26 ENCOUNTER — Ambulatory Visit
Admission: RE | Admit: 2024-05-26 | Discharge: 2024-05-26 | Disposition: A | Discharge status: Home / Self Care | Source: Ambulatory Visit | Attending: Hematology and Oncology | Admitting: Hematology and Oncology

## 2024-05-26 DIAGNOSIS — Z1231 Encounter for screening mammogram for malignant neoplasm of breast: Secondary | ICD-10-CM

## 2024-05-29 ENCOUNTER — Inpatient Hospital Stay: Admitting: Hematology and Oncology

## 2024-05-29 NOTE — Assessment & Plan Note (Deleted)
 Rt Lumpectomy 11/14/14: IDC Grade 3, 2.7 cm size, 0/3 LN , Margins clear; T2N0 Stage IIA Right breast invasive ductal carcinoma diagnosed with screening mammogram, 3.1 cm by ultrasound in the upper quadrant 6 cm from the nipple, grade 2-3, EF 100%, PR 48%, HER-2 negative ratio 0.6, Ki-67 was 28%, Oncotype DX score 19, 12% risk of recurrence, status post radiation 4800 cGy in 24 sessions completed 01/29/2015   Treatment plan: antiestrogen therapy with anastrozole  1 mg daily started 02/14/2015, plan for 10 years. She wants to take it longer but I am concerned about her bone density and her inability to take bisphosphonates We will order a bone density test.   Anastrozole  toxicities: Tolerating it very well without any side effects   Breast Cancer Surveillance: 1. Breast exam 05/29/2024: Benign 2. Mammograms 05/26/2024: Results are pending 3. Bone density: 11/24/2023: T score -2.6: Osteoporosis (used to be -2.5 in 2020)   Osteoporosis: Recommended calcium  vitamin D and bisphosphonate therapy.  She did not want to take bisphosphonates.    Return to clinic in 1 year for follow-up

## 2024-06-06 NOTE — Assessment & Plan Note (Signed)
 Rt Lumpectomy 11/14/14: IDC Grade 3, 2.7 cm size, 0/3 LN , Margins clear; T2N0 Stage IIA Right breast invasive ductal carcinoma diagnosed with screening mammogram, 3.1 cm by ultrasound in the upper quadrant 6 cm from the nipple, grade 2-3, EF 100%, PR 48%, HER-2 negative ratio 0.6, Ki-67 was 28%, Oncotype DX score 19, 12% risk of recurrence, status post radiation 4800 cGy in 24 sessions completed 01/29/2015   Treatment plan: antiestrogen therapy with anastrozole  1 mg daily started 02/14/2015, plan for 10 years. She wants to take it longer but I am concerned about her bone density and her inability to take bisphosphonates We will order a bone density test.   Anastrozole  toxicities: Tolerating it very well without any side effects   Breast Cancer Surveillance: 1. Breast exam 06/07/2024: Benign 2. Mammograms 05/30/2024: Benign, breast density category B 3. Bone density: 11/24/2023: T score -2.6 (was -2.5): Osteoporosis   Osteoporosis: Recommended calcium  vitamin D and bisphosphonate therapy.  She did not want to take bisphosphonates.     Return to clinic in 1 year for follow-up

## 2024-06-07 ENCOUNTER — Inpatient Hospital Stay: Attending: Hematology and Oncology | Admitting: Hematology and Oncology

## 2024-06-07 VITALS — BP 141/61 | HR 80 | Temp 98.1°F | Resp 18 | Ht 66.0 in | Wt 226.3 lb

## 2024-06-07 DIAGNOSIS — Z79811 Long term (current) use of aromatase inhibitors: Secondary | ICD-10-CM | POA: Diagnosis not present

## 2024-06-07 DIAGNOSIS — Z170 Estrogen receptor positive status [ER+]: Secondary | ICD-10-CM | POA: Insufficient documentation

## 2024-06-07 DIAGNOSIS — C50211 Malignant neoplasm of upper-inner quadrant of right female breast: Secondary | ICD-10-CM | POA: Diagnosis not present

## 2024-06-07 DIAGNOSIS — Z1721 Progesterone receptor positive status: Secondary | ICD-10-CM | POA: Diagnosis not present

## 2024-06-07 DIAGNOSIS — Z79899 Other long term (current) drug therapy: Secondary | ICD-10-CM | POA: Insufficient documentation

## 2024-06-07 DIAGNOSIS — Z7901 Long term (current) use of anticoagulants: Secondary | ICD-10-CM | POA: Diagnosis not present

## 2024-06-07 DIAGNOSIS — Z1731 Human epidermal growth factor receptor 2 positive status: Secondary | ICD-10-CM | POA: Diagnosis not present

## 2024-06-07 NOTE — Progress Notes (Signed)
 Patient Care Team: Joshua Debby CROME, MD as PCP - General (Internal Medicine) Gail Favorite, MD as Consulting Physician (General Surgery) Odean Potts, MD as Consulting Physician (Hematology and Oncology) Jason Charleston, MD (Inactive) as Consulting Physician (Radiation Oncology) Tyree Nanetta SAILOR, RN as Registered Nurse Letha Truman ORN, NP (Inactive) as Nurse Practitioner (Nurse Practitioner) Josefina Chew, MD as Consulting Physician (Orthopedic Surgery) Moses Powell Hummer, NP as Nurse Practitioner (Hematology and Oncology) Conception Setter, OD as Consulting Physician (Optometry) Szabat, Toribio BROCKS, [facility] (Inactive) (Pharmacist) Debarah Lorrene DEL., MD (Ophthalmology)  DIAGNOSIS:  Encounter Diagnoses  Name Primary?   Malignant neoplasm of upper-inner quadrant of right breast in female, estrogen receptor positive (HCC) Yes   Malignant neoplasm of upper-inner quadrant of right female breast, unspecified estrogen receptor status (HCC)     SUMMARY OF ONCOLOGIC HISTORY: Oncology History  Breast cancer of upper-inner quadrant of right female breast (HCC)  10/19/2014 Mammogram   Right breast mass 3.1 cm upper quadrant, 6 cm from the nipple   10/19/2014 Initial Biopsy   Right breast core needle bx: invasive ductal carcinoma, grade 2-3, ER+ (100%), PR+ (48%), HER-2 negative (ratio 0.6), Ki-67 28%   10/20/2014 Clinical Stage   Stage IIA: T2 N0   11/13/2014 Definitive Surgery   Right Lumpectomy/[facility] Warden): IDC, grade 3, 2.7 cm size, 3 LN removed and negative for malignancy (0/3) margins clear. HER2/neu repeated and remains negative (ratio 0.88).   11/13/2014 Pathologic Stage   Stage IIA: pT2 pN0   11/13/2014 Oncotype testing   Score 19 (12% ROR). No chemotherapy Daphane).   12/26/2014 - 01/29/2015 Radiation Therapy   Adjuvant RT Genevia): Right breast 48 Gy over 24 sessions   02/14/2015 -  Anti-estrogen oral therapy   Anastrozole  1 mg daily.     Survivorship   Survivorship care  plan completed and mailed to patient in lieu of inperson visit.     CHIEF COMPLIANT: Follow-up on anastrozole  therapy  HISTORY OF PRESENT ILLNESS:  History of Present Illness Stacey Navarro is an 83 year old female with breast cancer who presents for follow-up regarding her breast cancer treatment.  She is currently on anastrozole  for breast cancer, with one year remaining in her treatment plan. She experiences no breast pain or discomfort. A recent mammogram shows normal results with decreased breast density.     ALLERGIES:  is allergic to xarelto  [rivaroxaban ].  MEDICATIONS:  Current Outpatient Medications  Medication Sig Dispense Refill   buPROPion  (WELLBUTRIN  XL) 150 MG 24 hr tablet Take 1 tablet (150 mg total) by mouth daily. 90 tablet 0   ELIQUIS  2.5 MG TABS tablet Take 1 tablet by mouth twice daily 180 tablet 0   pantoprazole  (PROTONIX ) 40 MG tablet Take 1 tablet (40 mg total) by mouth daily as needed. 90 tablet 1   rosuvastatin  (CRESTOR ) 20 MG tablet Take 1 tablet (20 mg total) by mouth daily. 90 tablet 0   No current facility-administered medications for this visit.    PHYSICAL EXAMINATION: ECOG PERFORMANCE STATUS: 1 - Symptomatic but completely ambulatory  Vitals:   06/07/24 1000  BP: (!) 141/61  Pulse: 80  Resp: 18  Temp: 98.1 F (36.7 C)  SpO2: 95%   Filed Weights   06/07/24 1000  Weight: 226 lb 4.8 oz (102.6 kg)    Physical Exam   (exam performed in the presence of a chaperone)  LABORATORY DATA:  I have reviewed the data as listed    Latest Ref Rng & Units 03/16/2024  3:25 PM 08/05/2023   10:05 AM 09/02/2022    2:44 PM  CMP  Glucose 70 - 99 mg/dL 892  888  890   BUN 6 - 23 mg/dL 16  27  18    Creatinine 0.40 - 1.20 mg/dL 9.06  8.92  8.99   Sodium 135 - 145 mEq/L 142  140  141   Potassium 3.5 - 5.1 mEq/L 3.9  4.3  4.4   Chloride 96 - 112 mEq/L 106  104  106   CO2 19 - 32 mEq/L 26  28  26    Calcium  8.4 - 10.5 mg/dL 9.3  89.7  89.9   Total  Protein 6.0 - 8.3 g/dL 7.1  7.3  7.8   Total Bilirubin 0.2 - 1.2 mg/dL 0.5  0.4  0.5   Alkaline Phos 39 - 117 U/L 104  103  101   AST 0 - 37 U/L 23  32  30   ALT 0 - 35 U/L 25  31  33     Lab Results  Component Value Date   WBC 6.6 03/16/2024   HGB 13.5 03/16/2024   HCT 41.0 03/16/2024   MCV 95.2 03/16/2024   PLT 265.0 03/16/2024   NEUTROABS 3.9 03/16/2024    ASSESSMENT & PLAN:  Breast cancer of upper-inner quadrant of right female breast (HCC) Rt Lumpectomy 11/14/14: IDC Grade 3, 2.7 cm size, 0/3 LN , Margins clear; T2N0 Stage IIA Right breast invasive ductal carcinoma diagnosed with screening mammogram, 3.1 cm by ultrasound in the upper quadrant 6 cm from the nipple, grade 2-3, EF 100%, PR 48%, HER-2 negative ratio 0.6, Ki-67 was 28%, Oncotype DX score 19, 12% risk of recurrence, status post radiation 4800 cGy in 24 sessions completed 01/29/2015   Treatment plan: antiestrogen therapy with anastrozole  1 mg daily started 02/14/2015,  Recommended discontinuation of antiestrogen therapy at this time.  She has completed 9 years of treatment.  Anastrozole  toxicities: Tolerating it very well without any side effects   Breast Cancer Surveillance: 1. Breast exam 06/07/2024: Benign 2. Mammograms 05/30/2024: Benign, breast density category B 3. Bone density: 11/24/2023: T score -2.6 (was -2.5): Osteoporosis   Osteoporosis: Recommended calcium  vitamin D and bisphosphonate therapy.  She did not want to take bisphosphonates.     Return to clinic in 1 year for follow-up   No orders of the defined types were placed in this encounter.  The patient has a good understanding of the overall plan. she agrees with it. she will call with any problems that may develop before the next visit here.  I personally spent a total of 30 minutes in the care of the patient today including preparing to see the patient, getting/reviewing separately obtained history, performing a medically appropriate  exam/evaluation, counseling and educating, placing orders, referring and communicating with other health care professionals, documenting clinical information in the EHR, independently interpreting results, communicating results, and coordinating care.   Viinay K Orval Dortch, MD 06/07/24

## 2024-06-08 ENCOUNTER — Other Ambulatory Visit: Payer: Self-pay | Admitting: Internal Medicine

## 2024-06-08 DIAGNOSIS — F322 Major depressive disorder, single episode, severe without psychotic features: Secondary | ICD-10-CM

## 2024-07-05 ENCOUNTER — Other Ambulatory Visit: Payer: Self-pay | Admitting: Internal Medicine

## 2024-07-05 DIAGNOSIS — D6859 Other primary thrombophilia: Secondary | ICD-10-CM

## 2024-07-14 ENCOUNTER — Telehealth: Payer: Self-pay

## 2024-07-14 ENCOUNTER — Encounter: Payer: Self-pay | Admitting: Internal Medicine

## 2024-07-14 ENCOUNTER — Ambulatory Visit: Admitting: Gastroenterology

## 2024-07-14 ENCOUNTER — Encounter: Payer: Self-pay | Admitting: Gastroenterology

## 2024-07-14 VITALS — BP 140/70 | HR 76 | Ht 64.25 in | Wt 227.0 lb

## 2024-07-14 DIAGNOSIS — R194 Change in bowel habit: Secondary | ICD-10-CM | POA: Diagnosis not present

## 2024-07-14 DIAGNOSIS — Z1211 Encounter for screening for malignant neoplasm of colon: Secondary | ICD-10-CM

## 2024-07-14 MED ORDER — NA SULFATE-K SULFATE-MG SULF 17.5-3.13-1.6 GM/177ML PO SOLN: 1.0000 | Freq: Once | ORAL | 0 refills | Status: AC

## 2024-07-14 NOTE — Telephone Encounter (Signed)
" °  Stacey Navarro 26-Sep-1940 [id number]  07/14/2024    Dear Debby [name], MD:  We have scheduled the above named patient for a(n) endoscopic procedure. Our records show that (s)he is on anticoagulation therapy.  Please advise as to whether the patient may come off their therapy of Eliquis  2 days prior to their procedure which is scheduled for 08/07/24.  Please route your response to [employer] or fax response to [phone].  Sincerely,    [facility]   "

## 2024-07-14 NOTE — Progress Notes (Signed)
 "  Chief Complaint: follow-up acute diarrhea, discuss colonoscopy Primary GI Doctor: Dr. Suzann  HPI:  Patient is a  83  year old female patient with past medical history of Osteoarthritis,  Hyperlipidemia, history of DVT, history of Breast CA, who was self referred to me for a evaluation of diarrhea .    Interval History Patient last seen in GI office on 03/16/24 by myself.  Her diarrhea has mostly resolved.  No abdominal pain or blood in stool. Patient never had colonoscopy and would like to discuss scheduling today.   Patient has history of GERD and taking Pantoprazole  40mg  prn, she tells me very seldom.  Patient denies dysphagia. Patient denies nausea, vomiting, or weight loss.  Nonsmoker. She drinks wine occasionally.   Patient taking Eliquis  2.5mg  twice daily for history of DVT.   Surgical history: carpel tunnel surgeries, skin CA removed  Patient's family history: none  Wt Readings from Last 3 Encounters:  07/14/24 227 lb (103 kg)  06/07/24 226 lb 4.8 oz (102.6 kg)  04/24/24 225 lb (102.1 kg)     Past Medical History:  Diagnosis Date   Arthritis 2010   OA in knees   Breast cancer (HCC) 2016   right   Breast cancer of upper-inner quadrant of right female breast (HCC) 10/23/2014   Carpal tunnel syndrome 02/12/2015   Right   Carpal tunnel syndrome    GERD (gastroesophageal reflux disease)    Hyperlipidemia    Personal history of radiation therapy    Primary localized osteoarthritis of left knee 07/12/2015   Primary localized osteoarthritis of right knee 05/03/2015   S/P radiation therapy 12/26/2014 through 01/29/2015                                                      Right breast 4800 cGy in 24 sessions                           Past Surgical History:  Procedure Laterality Date   BREAST LUMPECTOMY Right 11/13/2014   CHOLECYSTECTOMY  2011   DILATION AND CURETTAGE OF UTERUS     PARTIAL KNEE ARTHROPLASTY Right 05/03/2015   Procedure: RIGHT UNICOMPARTMENTAL  KNEE;  Surgeon: Fonda Olmsted, MD;  Location: [facility];  Service: Orthopedics;  Laterality: Right;   PARTIAL KNEE ARTHROPLASTY Left 07/12/2015   Procedure: LEFT UNICOMPARTMENTAL KNEE;  Surgeon: Fonda Olmsted, MD;  Location: [facility];  Service: Orthopedics;  Laterality: Left;   RADIOACTIVE SEED GUIDED PARTIAL MASTECTOMY WITH AXILLARY SENTINEL LYMPH NODE BIOPSY Right 11/13/2014   Procedure: RIGHT PARTIAL MASTECTOMY WITH RADIOACTIVE SEED LOCALIZATION RIGHT  AXILLARY SENTINEL LYMPH NODE BIOPSY;  Surgeon: Elon Pacini, MD;  Location: [facility];  Service: General;  Laterality: Right;   SKIN CANCER EXCISION     TONSILLECTOMY  1948   TUBAL LIGATION      Current Outpatient Medications  Medication Sig Dispense Refill   buPROPion  (WELLBUTRIN  XL) 150 MG 24 hr tablet Take 1 tablet by mouth once daily 90 tablet 0   ELIQUIS  2.5 MG TABS tablet Take 1 tablet by mouth twice daily 180 tablet 0   Na Sulfate-K Sulfate-Mg Sulfate concentrate (SUPREP) 17.5-3.13-1.6 GM/177ML SOLN Take 1 kit (354 mLs total) by mouth once for 1 dose. 354 mL 0  pantoprazole  (PROTONIX ) 40 MG tablet Take 1 tablet (40 mg total) by mouth daily as needed. 90 tablet 1   rosuvastatin  (CRESTOR ) 20 MG tablet Take 1 tablet (20 mg total) by mouth daily. 90 tablet 0   No current facility-administered medications for this visit.    Allergies as of 07/14/2024 - Review Complete 07/14/2024  Allergen Reaction Noted   Xarelto  [rivaroxaban ]  03/13/2019    Family History  Problem Relation Age of Onset   Hypertension Mother    Alcohol abuse Father    Breast cancer Maternal Aunt     Review of Systems:    Constitutional: No weight loss, fever, chills, weakness or fatigue HEENT: Eyes: No change in vision               Ears, Nose, Throat:  No change in hearing or congestion Skin: No rash or itching Cardiovascular: No chest pain, chest pressure or palpitations   Respiratory: No SOB or  cough Gastrointestinal: See HPI and otherwise negative Genitourinary: No dysuria or change in urinary frequency Neurological: No headache, dizziness or syncope Musculoskeletal: No new muscle or joint pain Hematologic: No bleeding or bruising Psychiatric: No history of depression or anxiety    Physical Exam:  Vital signs: BP (!) 140/70 (BP Location: Left Arm, Patient Position: Sitting, Cuff Size: Large)   Pulse 76   Ht 5' 4.25 (1.632 m) Comment: height measured without shoes  Wt 227 lb (103 kg)   BMI 38.66 kg/m   Constitutional:   Pleasant female appears to be in NAD, Well developed, Well nourished, alert and cooperative Throat: Oral cavity and pharynx without inflammation, swelling or lesion.  Respiratory: Respirations even and unlabored. Lungs clear to auscultation bilaterally.   No wheezes, crackles, or rhonchi.  Cardiovascular: Normal S1, S2. Regular rate and rhythm. No peripheral edema, cyanosis or pallor.  Gastrointestinal:  Soft, nondistended, nontender. No rebound or guarding. Hypoactive bowel sounds. No appreciable masses or hepatomegaly. Rectal:  Not performed.  Msk:  Symmetrical without gross deformities. Without edema, no deformity or joint abnormality.  Neurologic:  Alert and  oriented x4;  grossly normal neurologically.  Skin:   Dry and intact without significant lesions or rashes.  RELEVANT LABS AND IMAGING: CBC    Latest Ref Rng & Units 03/16/2024    3:25 PM 08/05/2023   10:05 AM 09/02/2022    2:44 PM  CBC  WBC 4.0 - 10.5 K/uL 6.6  6.6  8.0   Hemoglobin 12.0 - 15.0 g/dL 86.4  85.7  85.6   Hematocrit 36.0 - 46.0 % 41.0  43.7  42.3   Platelets 150.0 - 400.0 K/uL 265.0  288.0  285.0      CMP     Latest Ref Rng & Units 03/16/2024    3:25 PM 08/05/2023   10:05 AM 09/02/2022    2:44 PM  CMP  Glucose 70 - 99 mg/dL 892  888  890   BUN 6 - 23 mg/dL 16  27  18    Creatinine 0.40 - 1.20 mg/dL 9.06  8.92  8.99   Sodium 135 - 145 mEq/L 142  140  141   Potassium 3.5 - 5.1  mEq/L 3.9  4.3  4.4   Chloride 96 - 112 mEq/L 106  104  106   CO2 19 - 32 mEq/L 26  28  26    Calcium  8.4 - 10.5 mg/dL 9.3  89.7  89.9   Total Protein 6.0 - 8.3 g/dL 7.1  7.3  7.8  Total Bilirubin 0.2 - 1.2 mg/dL 0.5  0.4  0.5   Alkaline Phos 39 - 117 U/L 104  103  101   AST 0 - 37 U/L 23  32  30   ALT 0 - 35 U/L 25  31  33      Lab Results  Component Value Date   TSH 3.21 08/05/2023  02/21/24 GI profile negative  02/2015 Cologuard negative   02/2024 labs show:Magnesium -2 , CRP<1,TTG IgA-neg, IgA-358  03/17/24 fecal calprotectin - 25   5/22 Abd u/s RUQ IMPRESSION: 1. No acute abnormalities. No biliary dilatation. 2. Moderate hepatic steatosis.   Assessment: Encounter Diagnoses  Name Primary?   Altered bowel habits Yes   Special screening for malignant neoplasms, colon   83 year old female patient that presents for follow-up on painless diarrhea with urgency that has mostly resolved. Other stool workup negative. Reports she saw her PCP and they discussed completing colonoscopy, she would like to set up today. We discussed cologuard vs colonoscopy, she would like to proceed with colonoscopy. Will go ahead and schedule colonoscopy in [facility] with Dr. Suzann.   History of DVT (on Eliquis )  History of Breast CA, Rt Lumpectomy 11/14/14  -Treatment plan: antiestrogen therapy with anastrozole  1 mg daily started 02/14/2015, plan for 10 years.    Plan: -Schedule for a colonoscopy in [facility] with Dr. Suzann. The risks and benefits of colonoscopy with possible polypectomy / biopsies were discussed and the patient agrees to proceed.  - Eliquis  clearance from Dr. Joshua -Hold Eliquis   2 days before procedure - will instruct when and how to resume after procedure. Risks and benefits of procedure including bleeding, perforation, infection, missed lesions, medication reactions and possible hospitalization or surgery if complications occur explained. Additional rare but real risk of cardiovascular  event such as heart attack or ischemia/infarct of other organs off Eliquis  explained and need to seek urgent help if this occurs. Will communicate by phone or EMR with patient's prescribing provider that to confirm holding Eliquis  is reasonable in this case.     Thank you for the courtesy of this consult. Please call me with any questions or concerns.   Jaythen Hamme, FNP-C  Gastroenterology 07/14/2024, 1:46 PM  Cc: Joshua Debby CROME, MD  I have reviewed the clinic note as outlined by Cathryne Beal, NP and agree with the assessment, plan and medical decision making.  Ms. Garver was initially referred for evaluation of diarrhea which has now resolved.  She has never undergone colonoscopy for colorectal cancer screening.  Has stable medical comorbidities and is appropriate for undergoing colonoscopy for colorectal cancer screening.  Agree with colonoscopy at [facility] and holding Eliquis  for 2 days prior to procedure.  Inocente Suzann, MD   "

## 2024-07-14 NOTE — Patient Instructions (Signed)
 We have sent the following medications to your pharmacy for you to pick up at your convenience: SUPREP  You have been scheduled for a colonoscopy. Please follow written instructions given to you at your visit today.   If you use inhalers (even only as needed), please bring them with you on the day of your procedure.  DO NOT TAKE 7 DAYS PRIOR TO TEST- Trulicity (dulaglutide) Ozempic, Wegovy (semaglutide) Mounjaro, Zepbound (tirzepatide) Bydureon Bcise (exanatide extended release)  DO NOT TAKE 1 DAY PRIOR TO YOUR TEST Rybelsus (semaglutide) Adlyxin (lixisenatide) Victoza (liraglutide) Byetta (exanatide) ___________________________________________________________________________  Due to recent changes in healthcare laws, you may see the results of your imaging and laboratory studies on MyChart before your provider has had a chance to review them.  We understand that in some cases there may be results that are confusing or concerning to you. Not all laboratory results come back in the same time frame and the provider may be waiting for multiple results in order to interpret others.  Please give us  48 hours in order for your provider to thoroughly review all the results before contacting the office for clarification of your results.   _______________________________________________________  If your blood pressure at your visit was 140/90 or greater, please contact your primary care physician to follow up on this.  _______________________________________________________  If you are age 88 or older, your body mass index should be between 23-30. Your Body mass index is 38.66 kg/m. If this is out of the aforementioned range listed, please consider follow up with your Primary Care Provider.  If you are age 37 or younger, your body mass index should be between 19-25. Your Body mass index is 38.66 kg/m. If this is out of the aformentioned range listed, please consider follow up with your Primary  Care Provider.   ________________________________________________________  The [facility] providers would like to encourage you to use MYCHART to communicate with providers for non-urgent requests or questions.  Due to long hold times on the telephone, sending your provider a message by [facility] may be a faster and more efficient way to get a response.  Please allow 48 business hours for a response.  Please remember that this is for non-urgent requests.  _______________________________________________________  [facility] is using a team-based approach to care.  Your team is made up of your doctor and two to three APPS. Our APPS (Nurse Practitioners and Physician Assistants) work with your physician to ensure care continuity for you. They are fully qualified to address your health concerns and develop a treatment plan. They communicate directly with your gastroenterologist to care for you. Seeing the Advanced Practice Practitioners on your physician's team can help you by facilitating care more promptly, often allowing for earlier appointments, access to diagnostic testing, procedures, and other specialty referrals.   Thank you for trusting me with your gastrointestinal care. Deanna May, FNP-C

## 2024-07-14 NOTE — Telephone Encounter (Signed)
" °  Stacey Navarro 1941-01-21 [id number]  07/14/2024    Dear Debby FREDRIK Molt, MD:  We have scheduled the above named patient for a(n) endoscopic procedure. Our records show that (s)he is on anticoagulation therapy.  Please advise as to whether the patient may come off their therapy of Eliquis  2 days prior to their procedure which is scheduled for 08/07/24.  Please route your response to [employer] or fax response to [phone].  Sincerely,    [facility]   "

## 2024-08-01 ENCOUNTER — Other Ambulatory Visit: Payer: Self-pay | Admitting: Internal Medicine

## 2024-08-01 DIAGNOSIS — E785 Hyperlipidemia, unspecified: Secondary | ICD-10-CM

## 2024-08-04 ENCOUNTER — Telehealth: Payer: Self-pay

## 2024-08-04 NOTE — Telephone Encounter (Signed)
 Called the office of Dr Joshua. Identified myself and asked for help getting clearance for the Eliquis . A message was sent to the nurse and she will get back to me when she can. The PCP office is aware the procedure is Monday. I have left the nurse my direct telephone number.     Tried to contact the patient. No answer. Voicemail is full and cannot accept new messages.

## 2024-08-04 NOTE — Telephone Encounter (Signed)
 Patient called stating she has not been advised on holding Eliquis  for 1/12 colonoscopy.. Requesting a call back. Please advise, thank you

## 2024-08-04 NOTE — Telephone Encounter (Signed)
 Error

## 2024-08-04 NOTE — Telephone Encounter (Signed)
 Received msg [facility] needed authorization for pt to hold Eliquis  for EGD on Monday, 1/12.  Review of chart revealed a letter was written by the PCP on 07/14/24 after he received the request. This letter states authorization to hold Eliquis  for 2 days prior to surgery date.  Contacted Elizabeth McKey at [facility] at [phone] and advised of letter in the chart written by PCP for Eliquis  hold. Almarie reported she can see the letter and appreciated the help.  Nothing further needed.

## 2024-08-04 NOTE — Telephone Encounter (Signed)
 See telephone encounter.

## 2024-08-04 NOTE — Telephone Encounter (Signed)
 Called the office of Dr Joshua. Identified myself and asked for help getting clearance for the Eliquis . A message was sent to the nurse and she will get back to me when she can. The PCP office is aware the procedure is Monday. I have left the nurse my direct telephone number.

## 2024-08-06 NOTE — Progress Notes (Unsigned)
 [facility] History and Physical   Primary Care Physician:  Joshua Debby CROME, MD   Reason for Procedure:  Colorectal cancer screening  Plan:    Screening colonoscopy   The patient was provided an opportunity to ask questions and all were answered. The patient agreed with the plan.   HPI: Stacey Navarro is a 84 y.o. female undergoing screening colonoscopy for colorectal cancer screening.  This is the patient's first colonoscopy.  Was seen in the office in August 2025 for acute diarrhea which resolved and had a negative workup.  No family history of colorectal cancer.  Patient has a history of negative Cologuard in the remote past.  She is on Eliquis  for DVT with last dose 08/04/24   Past Medical History:  Diagnosis Date   Arthritis 2010   OA in knees   Breast cancer (HCC) 2016   right   Breast cancer of upper-inner quadrant of right female breast (HCC) 10/23/2014   Carpal tunnel syndrome 02/12/2015   Right   Carpal tunnel syndrome    DVT (deep venous thrombosis) (HCC)    right leg- on Eliquis    GERD (gastroesophageal reflux disease)    Hyperlipidemia    Personal history of radiation therapy    Primary localized osteoarthritis of left knee 07/12/2015   Primary localized osteoarthritis of right knee 05/03/2015   S/P radiation therapy 12/26/2014 through 01/29/2015                                                      Right breast 4800 cGy in 24 sessions                           Past Surgical History:  Procedure Laterality Date   BREAST LUMPECTOMY Right 11/13/2014   CHOLECYSTECTOMY  2011   DILATION AND CURETTAGE OF UTERUS     PARTIAL KNEE ARTHROPLASTY Right 05/03/2015   Procedure: RIGHT UNICOMPARTMENTAL KNEE;  Surgeon: Fonda Olmsted, MD;  Location: [facility];  Service: Orthopedics;  Laterality: Right;   PARTIAL KNEE ARTHROPLASTY Left 07/12/2015   Procedure: LEFT UNICOMPARTMENTAL KNEE;  Surgeon: Fonda Olmsted, MD;  Location: [facility];  Service: Orthopedics;  Laterality: Left;   RADIOACTIVE SEED GUIDED PARTIAL MASTECTOMY WITH AXILLARY SENTINEL LYMPH NODE BIOPSY Right 11/13/2014   Procedure: RIGHT PARTIAL MASTECTOMY WITH RADIOACTIVE SEED LOCALIZATION RIGHT  AXILLARY SENTINEL LYMPH NODE BIOPSY;  Surgeon: Elon Pacini, MD;  Location: [facility];  Service: General;  Laterality: Right;   SKIN CANCER EXCISION     TONSILLECTOMY  1948   TUBAL LIGATION      Prior to Admission medications  Medication Sig Start Date End Date Taking? Authorizing Provider  buPROPion  (WELLBUTRIN  XL) 150 MG 24 hr tablet Take 1 tablet by mouth once daily 06/12/24   Joshua Debby CROME, MD  ELIQUIS  2.5 MG TABS tablet Take 1 tablet by mouth twice daily 07/05/24   Joshua Debby CROME, MD  pantoprazole  (PROTONIX ) 40 MG tablet Take 1 tablet (40 mg total) by mouth daily as needed. 08/06/23   Joshua Debby CROME, MD  rosuvastatin  (CRESTOR ) 20 MG tablet Take 1 tablet by mouth once daily 08/02/24   Joshua Debby CROME, MD    Current Outpatient Medications  Medication Sig Dispense Refill   buPROPion  (WELLBUTRIN  XL)  150 MG 24 hr tablet Take 1 tablet by mouth once daily 90 tablet 0   rosuvastatin  (CRESTOR ) 20 MG tablet Take 1 tablet by mouth once daily 90 tablet 0   ELIQUIS  2.5 MG TABS tablet Take 1 tablet by mouth twice daily 180 tablet 0   pantoprazole  (PROTONIX ) 40 MG tablet Take 1 tablet (40 mg total) by mouth daily as needed. 90 tablet 1   Current Facility-Administered Medications  Medication Dose Route Frequency Provider Last Rate Last Admin   0.9 %  sodium chloride  infusion  500 mL Intravenous Once [place] Wenzlick M, MD        Allergies as of 08/07/2024 - Review Complete 08/07/2024  Allergen Reaction Noted   Xarelto  [rivaroxaban ] Rash 03/13/2019    Family History  Problem Relation Age of Onset   Hypertension Mother    Alcohol abuse Father    Breast cancer Maternal Aunt    Colon cancer Neg Hx    Esophageal cancer Neg Hx    Rectal cancer Neg  Hx    Stomach cancer Neg Hx     Social History   Socioeconomic History   Marital status: Widowed    Spouse name: Not on file   Number of children: Not on file   Years of education: Not on file   Highest education level: 12th grade  Occupational History   Not on file  Tobacco Use   Smoking status: Former    Current packs/day: 0.00    Average packs/day: 1.0 packs/day    Types: Cigarettes    Quit date: 11/06/1995    Years since quitting: 28.7    Passive exposure: Past   Smokeless tobacco: Never  Vaping Use   Vaping status: Never Used  Substance and Sexual Activity   Alcohol use: Yes    Alcohol/week: 21.0 standard drinks of alcohol    Types: 14 Glasses of wine, 7 Cans of beer per week   Drug use: No   Sexual activity: Not Currently    Birth control/protection: Post-menopausal  Other Topics Concern   Not on file  Social History Narrative   Widowed   Social Drivers of Health   Tobacco Use: Medium Risk (08/07/2024)   Patient History    Smoking Tobacco Use: Former    Smokeless Tobacco Use: Never    Passive Exposure: Past  Physicist, Medical Strain: Patient Declined (02/21/2024)   Overall Financial Resource Strain (CARDIA)    Difficulty of Paying Living Expenses: Patient declined  Food Insecurity: Patient Declined (02/21/2024)   Epic    Worried About Programme Researcher, Broadcasting/film/video in the Last Year: Patient declined    Barista in the Last Year: Patient declined  Transportation Needs: Patient Declined (02/21/2024)   Epic    Lack of Transportation (Medical): Patient declined    Lack of Transportation (Non-Medical): Patient declined  Physical Activity: Insufficiently Active (02/21/2024)   Exercise Vital Sign    Days of Exercise per Week: 1 day    Minutes of Exercise per Session: 10 min  Stress: No Stress Concern Present (02/21/2024)   [employer] of Occupational Health - Occupational Stress Questionnaire    Feeling of Stress: Not at all  Social Connections: Unknown  (02/21/2024)   Social Connection and Isolation Panel    Frequency of Communication with Friends and Family: Patient declined    Frequency of Social Gatherings with Friends and Family: Patient declined    Attends Religious Services: Patient declined    Active Member of Clubs or  Organizations: Patient declined    Attends Banker Meetings: Not on file    Marital Status: Widowed  Recent Concern: Social Connections - Socially Isolated (12/08/2023)   Social Connection and Isolation Panel    Frequency of Communication with Friends and Family: Three times a week    Frequency of Social Gatherings with Friends and Family: Three times a week    Attends Religious Services: Never    Active Member of Clubs or Organizations: No    Attends Banker Meetings: Never    Marital Status: Widowed  Intimate Partner Violence: Not At Risk (12/08/2023)   Humiliation, Afraid, Rape, and Kick questionnaire    Fear of Current or Ex-Partner: No    Emotionally Abused: No    Physically Abused: No    Sexually Abused: No  Depression (PHQ2-9): Low Risk (04/24/2024)   Depression (PHQ2-9)    PHQ-2 Score: 0  Alcohol Screen: Low Risk (12/08/2023)   Alcohol Screen    Last Alcohol Screening Score (AUDIT): 0  Housing: Patient Declined (02/21/2024)   Epic    Unable to Pay for Housing in the Last Year: Patient declined    Number of Times Moved in the Last Year: Not on file    Homeless in the Last Year: Patient declined  Utilities: Not At Risk (12/08/2023)   [employer] Utilities    Threatened with loss of utilities: No  Health Literacy: Adequate Health Literacy (12/08/2023)   B1300 Health Literacy    Frequency of need for help with medical instructions: Never    Review of Systems:  All other review of systems negative except as mentioned in the HPI.  Physical Exam: Vital signs BP (!) 186/84   Pulse 79   Temp (!) 97.3 F (36.3 C) (Temporal)   Ht 5' 4.25 (1.632 m)   Wt 227 lb (103 kg)   SpO2 94%    BMI 38.66 kg/m   General:   Alert,  Well-developed, well-nourished, pleasant and cooperative in NAD Airway:  Mallampati 3 Lungs:  Clear throughout to auscultation.   Heart:  Regular rate and rhythm; no murmurs, clicks, rubs,  or gallops. Abdomen:  Soft, nontender and nondistended. Normal bowel sounds.   Neuro/Psych:  Normal mood and affect. A and O x 3  Inocente Hausen, MD [facility]

## 2024-08-07 ENCOUNTER — Ambulatory Visit: Admitting: Pediatrics

## 2024-08-07 ENCOUNTER — Encounter: Payer: Self-pay | Admitting: Pediatrics

## 2024-08-07 VITALS — BP 135/60 | HR 62 | Temp 97.3°F | Resp 14 | Ht 64.25 in | Wt 227.0 lb

## 2024-08-07 DIAGNOSIS — Z1211 Encounter for screening for malignant neoplasm of colon: Secondary | ICD-10-CM

## 2024-08-07 DIAGNOSIS — D129 Benign neoplasm of anus and anal canal: Secondary | ICD-10-CM

## 2024-08-07 DIAGNOSIS — K635 Polyp of colon: Secondary | ICD-10-CM | POA: Diagnosis not present

## 2024-08-07 DIAGNOSIS — K5730 Diverticulosis of large intestine without perforation or abscess without bleeding: Secondary | ICD-10-CM | POA: Diagnosis not present

## 2024-08-07 DIAGNOSIS — D175 Benign lipomatous neoplasm of intra-abdominal organs: Secondary | ICD-10-CM | POA: Diagnosis not present

## 2024-08-07 DIAGNOSIS — R194 Change in bowel habit: Secondary | ICD-10-CM

## 2024-08-07 DIAGNOSIS — D120 Benign neoplasm of cecum: Secondary | ICD-10-CM

## 2024-08-07 DIAGNOSIS — K621 Rectal polyp: Secondary | ICD-10-CM

## 2024-08-07 DIAGNOSIS — K648 Other hemorrhoids: Secondary | ICD-10-CM

## 2024-08-07 MED ORDER — SODIUM CHLORIDE 0.9 % IV SOLN: 500.0000 mL | Freq: Once | INTRAVENOUS | Status: DC

## 2024-08-07 NOTE — Progress Notes (Signed)
 Vss nad trans to pacu

## 2024-08-07 NOTE — Progress Notes (Signed)
 Called to room to assist during endoscopic procedure.  Patient ID and intended procedure confirmed with present staff. Received instructions for my participation in the procedure from the performing physician.

## 2024-08-07 NOTE — Op Note (Addendum)
 [facility] Patient Name: Stacey Navarro Procedure Date: 08/07/2024 8:17 AM MRN: [id number] Endoscopist: Inocente Hausen , MD, [id number] Age: 84 Referring MD:  Date of Birth: 04-16-1941 Gender: Female Account #: [id number] Procedure:                Colonoscopy Indications:              Screening for colorectal malignant neoplasm, This                            is the patient's first colonoscopy Medicines:                Monitored Anesthesia Care Procedure:                Pre-Anesthesia Assessment:                           - Prior to the procedure, a History and Physical                            was performed, and patient medications and                            allergies were reviewed. The patient's tolerance of                            previous anesthesia was also reviewed. The risks                            and benefits of the procedure and the sedation                            options and risks were discussed with the patient.                            All questions were answered, and informed consent                            was obtained. Prior Anticoagulants: The patient has                            taken Eliquis  (apixaban ), last dose was 3 days                            prior to procedure. ASA Grade Assessment: III - A                            patient with severe systemic disease. After                            reviewing the risks and benefits, the patient was                            deemed in satisfactory condition to undergo the  procedure.                           After obtaining informed consent, the colonoscope                            was passed under direct vision. Throughout the                            procedure, the patient's blood pressure, pulse, and                            oxygen saturations were monitored continuously. The                            Olympus Scope SN: [id number] was introduced through                             the anus and advanced to the cecum, identified by                            appendiceal orifice and ileocecal valve. The                            colonoscopy was performed without difficulty. The                            patient tolerated the procedure well. The quality                            of the bowel preparation was good. The ileocecal                            valve, appendiceal orifice, and rectum were                            photographed. Scope In: 8:32:36 AM Scope Out: 8:55:43 AM Scope Withdrawal Time: 0 hours 10 minutes 57 seconds  Total Procedure Duration: 0 hours 23 minutes 7 seconds  Findings:                 Hemorrhoids were found on perianal exam.                           The digital rectal exam was normal. Pertinent                            negatives include normal sphincter tone and no                            palpable rectal lesions.                           Multiple small-mouthed diverticula were found in  the sigmoid colon, descending colon and ascending                            colon.                           A 7 mm polyp was found in the cecum. The polyp was                            flat. The polyp was removed with a hot snare.                            Resection and retrieval were complete.                           A 4 mm polyp was found in the rectum. The polyp was                            sessile. The polyp was removed with a cold biopsy                            forceps. Resection and retrieval were complete.                           Internal hemorrhoids were found during retroflexion. Complications:            No immediate complications. Estimated blood loss:                            Minimal. Estimated Blood Loss:     Estimated blood loss was minimal. Impression:               - Hemorrhoids found on perianal exam.                           - Diverticulosis in the sigmoid  colon, in the                            descending colon and in the ascending colon.                           - One 7 mm polyp in the cecum, removed with a hot                            snare. Resected and retrieved.                           - One 4 mm polyp in the rectum, removed with a cold                            biopsy forceps. Resected and retrieved.                           - Internal hemorrhoids. Recommendation:           -  Discharge patient to home (ambulatory).                           - Await pathology results.                           - The findings and recommendations were discussed                            with the patient's family.                           - Patient has a contact number available for                            emergencies. The signs and symptoms of potential                            delayed complications were discussed with the                            patient. Return to normal activities tomorrow.                            Written discharge instructions were provided to the                            patient. Inocente Hausen, MD 08/07/2024 9:06:32 AM This report has been signed electronically.

## 2024-08-07 NOTE — Progress Notes (Signed)
 Pt's states no medical or surgical changes since previsit or office visit.

## 2024-08-07 NOTE — Patient Instructions (Addendum)
 Internal hemorrhoids. Recommendation:           - Discharge patient to home (ambulatory).                           - Await pathology results.                           - The findings and recommendations were discussed                            with the patient's family.                           - Patient has a contact number available for                            emergencies. The signs and symptoms of potential                            delayed complications were discussed with the                            patient. Return to normal activities tomorrow.                            Written discharge instructions were provided to the                            patient. Handout on polyps, diverticulosis and hemorrhoids given.  Per Dr. Suzann resume Eliquis  on 08/09/24 at normal dose    YOU HAD AN ENDOSCOPIC PROCEDURE TODAY AT THE [facility]:   Refer to the procedure report that was given to you for any specific questions about what was found during the examination.  If the procedure report does not answer your questions, please call your gastroenterologist to clarify.  If you requested that your care partner not be given the details of your procedure findings, then the procedure report has been included in a sealed envelope for you to review at your convenience later.  YOU SHOULD EXPECT: Some feelings of bloating in the abdomen. Passage of more gas than usual.  Walking can help get rid of the air that was put into your GI tract during the procedure and reduce the bloating. If you had a lower endoscopy (such as a colonoscopy or flexible sigmoidoscopy) you may notice spotting of blood in your stool or on the toilet paper. If you underwent a bowel prep for your procedure, you may not have a normal bowel movement for a few days.  Please Note:  You might notice some irritation and congestion in your nose or some drainage.  This is from the oxygen used during your procedure.  There is  no need for concern and it should clear up in a day or so.  SYMPTOMS TO REPORT IMMEDIATELY:  Following lower endoscopy (colonoscopy or flexible sigmoidoscopy):  Excessive amounts of blood in the stool  Significant tenderness or worsening of abdominal pains  Swelling of the abdomen that is new, acute  Fever of 100F or higher   For urgent or emergent issues,  a gastroenterologist can be reached at any hour by calling [phone]. Do not use MyChart messaging for urgent concerns.    DIET:  We do recommend a small meal at first, but then you may proceed to your regular diet.  Drink plenty of fluids but you should avoid alcoholic beverages for 24 hours.  ACTIVITY:  You should plan to take it easy for the rest of today and you should NOT DRIVE or use heavy machinery until tomorrow (because of the sedation medicines used during the test).    FOLLOW UP: Our staff will call the number listed on your records the next business day following your procedure.  We will call around 7:15- 8:00 am to check on you and address any questions or concerns that you may have regarding the information given to you following your procedure. If we do not reach you, we will leave a message.     If any biopsies were taken you will be contacted by phone or by letter within the next 1-3 weeks.  Please call us  at [phone] if you have not heard about the biopsies in 3 weeks.    SIGNATURES/CONFIDENTIALITY: You and/or your care partner have signed paperwork which will be entered into your electronic medical record.  These signatures attest to the fact that that the information above on your After Visit Summary has been reviewed and is understood.  Full responsibility of the confidentiality of this discharge information lies with you and/or your care-partner.

## 2024-08-08 ENCOUNTER — Telehealth: Payer: Self-pay

## 2024-08-08 NOTE — Telephone Encounter (Signed)
No answer on follow up call. 

## 2024-08-10 ENCOUNTER — Ambulatory Visit: Payer: Self-pay | Admitting: Pediatrics

## 2024-08-10 LAB — SURGICAL PATHOLOGY

## 2024-12-11 ENCOUNTER — Ambulatory Visit
